# Patient Record
Sex: Female | Born: 1935
Health system: Southern US, Community
[De-identification: ages and names within clinical notes are randomized; demographics above are authoritative.]

## PROBLEM LIST (undated history)

## (undated) DIAGNOSIS — E790 Hyperuricemia without signs of inflammatory arthritis and tophaceous disease: Secondary | ICD-10-CM

## (undated) DIAGNOSIS — N189 Chronic kidney disease, unspecified: Secondary | ICD-10-CM

## (undated) DIAGNOSIS — M21611 Bunion of right foot: Secondary | ICD-10-CM

## (undated) DIAGNOSIS — R351 Nocturia: Secondary | ICD-10-CM

## (undated) DIAGNOSIS — M21612 Bunion of left foot: Secondary | ICD-10-CM

## (undated) DIAGNOSIS — N289 Disorder of kidney and ureter, unspecified: Secondary | ICD-10-CM

## (undated) DIAGNOSIS — K449 Diaphragmatic hernia without obstruction or gangrene: Secondary | ICD-10-CM

## (undated) DIAGNOSIS — R221 Localized swelling, mass and lump, neck: Secondary | ICD-10-CM

## (undated) DIAGNOSIS — M1990 Unspecified osteoarthritis, unspecified site: Secondary | ICD-10-CM

## (undated) DIAGNOSIS — I889 Nonspecific lymphadenitis, unspecified: Secondary | ICD-10-CM

## (undated) DIAGNOSIS — H3530 Unspecified macular degeneration: Secondary | ICD-10-CM

## (undated) DIAGNOSIS — I739 Peripheral vascular disease, unspecified: Secondary | ICD-10-CM

## (undated) DIAGNOSIS — I2510 Atherosclerotic heart disease of native coronary artery without angina pectoris: Secondary | ICD-10-CM

## (undated) DIAGNOSIS — K219 Gastro-esophageal reflux disease without esophagitis: Secondary | ICD-10-CM

## (undated) DIAGNOSIS — M1A3720 Chronic gout due to renal impairment, left ankle and foot, without tophus (tophi): Secondary | ICD-10-CM

## (undated) DIAGNOSIS — I10 Essential (primary) hypertension: Secondary | ICD-10-CM

## (undated) DIAGNOSIS — E785 Hyperlipidemia, unspecified: Secondary | ICD-10-CM

## (undated) DIAGNOSIS — J449 Chronic obstructive pulmonary disease, unspecified: Secondary | ICD-10-CM

## (undated) DIAGNOSIS — C801 Malignant (primary) neoplasm, unspecified: Secondary | ICD-10-CM

## (undated) DIAGNOSIS — Z95828 Presence of other vascular implants and grafts: Secondary | ICD-10-CM

## (undated) DIAGNOSIS — B999 Unspecified infectious disease: Secondary | ICD-10-CM

## (undated) DIAGNOSIS — I749 Embolism and thrombosis of unspecified artery: Secondary | ICD-10-CM

## (undated) HISTORY — DX: Chronic kidney disease, unspecified: N18.9

## (undated) HISTORY — PX: OTHER SURGICAL HISTORY: SHX169

## (undated) HISTORY — DX: Bunion of right foot: M21.611

## (undated) HISTORY — DX: Hyperuricemia without signs of inflammatory arthritis and tophaceous disease: E79.0

## (undated) HISTORY — DX: Malignant (primary) neoplasm, unspecified: C80.1

## (undated) HISTORY — DX: Bunion of left foot: M21.612

## (undated) HISTORY — DX: Diaphragmatic hernia without obstruction or gangrene: K44.9

## (undated) HISTORY — DX: Hyperlipidemia, unspecified: E78.5

## (undated) HISTORY — DX: Gastro-esophageal reflux disease without esophagitis: K21.9

## (undated) HISTORY — DX: Unspecified osteoarthritis, unspecified site: M19.90

## (undated) HISTORY — DX: Localized swelling, mass and lump, neck: R22.1

## (undated) HISTORY — DX: Unspecified infectious disease: B99.9

## (undated) HISTORY — DX: Peripheral vascular disease, unspecified: I73.9

## (undated) HISTORY — DX: Chronic obstructive pulmonary disease, unspecified: J44.9

## (undated) HISTORY — PX: CORONARY ANGIOPLASTY WITH STENT PLACEMENT: SHX49

## (undated) HISTORY — DX: Chronic gout due to renal impairment, left ankle and foot, without tophus (tophi): M1A.3720

## (undated) HISTORY — DX: Presence of other vascular implants and grafts: Z95.828

## (undated) HISTORY — DX: Nocturia: R35.1

## (undated) HISTORY — DX: Nonspecific lymphadenitis, unspecified: I88.9

## (undated) HISTORY — DX: Atherosclerotic heart disease of native coronary artery without angina pectoris: I25.10

## (undated) HISTORY — PX: CHOLECYSTECTOMY: SHX55

---

## 1979-12-01 HISTORY — PX: ABDOMINAL HYSTERECTOMY: SHX81

## 2002-01-28 HISTORY — PX: OTHER SURGICAL HISTORY: SHX169

## 2005-08-24 ENCOUNTER — Ambulatory Visit: Payer: Self-pay | Admitting: *Deleted

## 2005-09-23 ENCOUNTER — Inpatient Hospital Stay: Payer: Self-pay | Admitting: *Deleted

## 2006-03-22 ENCOUNTER — Ambulatory Visit: Payer: Self-pay | Admitting: *Deleted

## 2008-09-13 ENCOUNTER — Ambulatory Visit: Payer: Self-pay | Admitting: Vascular Surgery

## 2009-03-21 ENCOUNTER — Observation Stay: Payer: Self-pay | Admitting: *Deleted

## 2010-09-15 ENCOUNTER — Ambulatory Visit: Payer: Self-pay

## 2011-11-19 ENCOUNTER — Ambulatory Visit: Payer: Self-pay | Admitting: Family Medicine

## 2012-01-20 ENCOUNTER — Ambulatory Visit: Payer: Self-pay | Admitting: Internal Medicine

## 2012-01-22 ENCOUNTER — Ambulatory Visit: Payer: Self-pay | Admitting: Unknown Physician Specialty

## 2012-03-21 ENCOUNTER — Ambulatory Visit: Payer: Self-pay | Admitting: Gastroenterology

## 2012-05-11 ENCOUNTER — Ambulatory Visit: Payer: Self-pay | Admitting: Family Medicine

## 2012-11-01 ENCOUNTER — Ambulatory Visit: Payer: Self-pay | Admitting: Internal Medicine

## 2012-11-08 ENCOUNTER — Ambulatory Visit: Payer: Self-pay | Admitting: Internal Medicine

## 2013-01-21 ENCOUNTER — Emergency Department: Payer: Self-pay | Admitting: Emergency Medicine

## 2013-01-22 DIAGNOSIS — I70229 Atherosclerosis of native arteries of extremities with rest pain, unspecified extremity: Secondary | ICD-10-CM | POA: Insufficient documentation

## 2013-01-22 DIAGNOSIS — I2510 Atherosclerotic heart disease of native coronary artery without angina pectoris: Secondary | ICD-10-CM | POA: Insufficient documentation

## 2013-01-22 DIAGNOSIS — I70209 Unspecified atherosclerosis of native arteries of extremities, unspecified extremity: Secondary | ICD-10-CM | POA: Insufficient documentation

## 2013-01-22 DIAGNOSIS — I10 Essential (primary) hypertension: Secondary | ICD-10-CM | POA: Insufficient documentation

## 2013-01-22 DIAGNOSIS — N189 Chronic kidney disease, unspecified: Secondary | ICD-10-CM | POA: Insufficient documentation

## 2013-01-22 LAB — CBC
HCT: 34.7 % — ABNORMAL LOW (ref 35.0–47.0)
MCH: 30.6 pg (ref 26.0–34.0)
MCV: 95 fL (ref 80–100)
Platelet: 257 10*3/uL (ref 150–440)
RBC: 3.67 10*6/uL — ABNORMAL LOW (ref 3.80–5.20)
RDW: 13.3 % (ref 11.5–14.5)

## 2013-03-08 DIAGNOSIS — K219 Gastro-esophageal reflux disease without esophagitis: Secondary | ICD-10-CM | POA: Insufficient documentation

## 2013-03-31 ENCOUNTER — Encounter: Payer: Self-pay | Admitting: Internal Medicine

## 2013-04-04 LAB — CBC WITH DIFFERENTIAL/PLATELET
Eosinophil #: 0.4 10*3/uL (ref 0.0–0.7)
Eosinophil %: 4.5 %
HCT: 21.7 % — ABNORMAL LOW (ref 35.0–47.0)
HGB: 7.2 g/dL — ABNORMAL LOW (ref 12.0–16.0)
Lymphocyte #: 2.6 10*3/uL (ref 1.0–3.6)
Lymphocyte %: 30.6 %
MCH: 30.8 pg (ref 26.0–34.0)
MCHC: 33.2 g/dL (ref 32.0–36.0)
Monocyte #: 0.8 x10 3/mm (ref 0.2–0.9)
Neutrophil #: 4.7 10*3/uL (ref 1.4–6.5)
Neutrophil %: 54.5 %
Platelet: 204 10*3/uL (ref 150–440)

## 2013-04-04 LAB — PROTIME-INR: INR: 1.9

## 2013-04-05 ENCOUNTER — Observation Stay: Payer: Self-pay | Admitting: Internal Medicine

## 2013-04-05 LAB — HEMOGLOBIN: HGB: 8.2 g/dL — ABNORMAL LOW (ref 12.0–16.0)

## 2013-04-05 LAB — HEMATOCRIT: HCT: 24.4 % — ABNORMAL LOW (ref 35.0–47.0)

## 2013-04-07 LAB — CBC WITH DIFFERENTIAL/PLATELET
Basophil #: 0.1 10*3/uL (ref 0.0–0.1)
Basophil %: 0.6 %
HCT: 25 % — ABNORMAL LOW (ref 35.0–47.0)
HGB: 8.3 g/dL — ABNORMAL LOW (ref 12.0–16.0)
MCH: 30.8 pg (ref 26.0–34.0)
MCHC: 33.4 g/dL (ref 32.0–36.0)
Monocyte %: 11.6 %
Neutrophil #: 6.3 10*3/uL (ref 1.4–6.5)
Platelet: 229 10*3/uL (ref 150–440)
RBC: 2.71 10*6/uL — ABNORMAL LOW (ref 3.80–5.20)

## 2013-04-07 LAB — PROTIME-INR: Prothrombin Time: 20.6 secs — ABNORMAL HIGH (ref 11.5–14.7)

## 2013-04-13 LAB — CBC WITH DIFFERENTIAL/PLATELET
Basophil #: 0.1 10*3/uL (ref 0.0–0.1)
Eosinophil #: 0.3 10*3/uL (ref 0.0–0.7)
Eosinophil %: 5.2 %
HGB: 8.9 g/dL — ABNORMAL LOW (ref 12.0–16.0)
MCHC: 33.5 g/dL (ref 32.0–36.0)
MCV: 91 fL (ref 80–100)
Neutrophil #: 4 10*3/uL (ref 1.4–6.5)
Neutrophil %: 62.7 %
RBC: 2.89 10*6/uL — ABNORMAL LOW (ref 3.80–5.20)

## 2013-04-27 ENCOUNTER — Observation Stay: Payer: Self-pay | Admitting: Specialist

## 2013-04-27 LAB — COMPREHENSIVE METABOLIC PANEL
BUN: 24 mg/dL — ABNORMAL HIGH (ref 7–18)
Co2: 22 mmol/L (ref 21–32)
Creatinine: 1.7 mg/dL — ABNORMAL HIGH (ref 0.60–1.30)
EGFR (African American): 32 — ABNORMAL LOW
EGFR (Non-African Amer.): 28 — ABNORMAL LOW
Glucose: 113 mg/dL — ABNORMAL HIGH (ref 65–99)
Potassium: 4.4 mmol/L (ref 3.5–5.1)
SGOT(AST): 36 U/L (ref 15–37)
SGPT (ALT): 19 U/L (ref 12–78)
Sodium: 136 mmol/L (ref 136–145)

## 2013-04-27 LAB — PROTIME-INR: INR: 1.8

## 2013-04-27 LAB — CK TOTAL AND CKMB (NOT AT ARMC)
CK, Total: 49 U/L (ref 21–215)
CK-MB: 0.5 ng/mL (ref 0.5–3.6)

## 2013-04-27 LAB — PRO B NATRIURETIC PEPTIDE: B-Type Natriuretic Peptide: 14104 pg/mL — ABNORMAL HIGH (ref 0–450)

## 2013-04-27 LAB — CBC
HCT: 26.5 % — ABNORMAL LOW (ref 35.0–47.0)
HGB: 8.8 g/dL — ABNORMAL LOW (ref 12.0–16.0)
MCH: 29.7 pg (ref 26.0–34.0)
MCHC: 33.1 g/dL (ref 32.0–36.0)
Platelet: 149 10*3/uL — ABNORMAL LOW (ref 150–440)

## 2013-04-27 LAB — TROPONIN I: Troponin-I: <0.02 ng/mL

## 2013-04-28 LAB — BASIC METABOLIC PANEL
Anion Gap: 7 (ref 7–16)
BUN: 27 mg/dL — ABNORMAL HIGH (ref 7–18)
Chloride: 103 mmol/L (ref 98–107)
Co2: 25 mmol/L (ref 21–32)
EGFR (Non-African Amer.): 23 — ABNORMAL LOW
Glucose: 112 mg/dL — ABNORMAL HIGH (ref 65–99)
Osmolality: 276 (ref 275–301)
Potassium: 3.7 mmol/L (ref 3.5–5.1)
Sodium: 135 mmol/L — ABNORMAL LOW (ref 136–145)

## 2013-04-28 LAB — TROPONIN I: Troponin-I: 0.02 ng/mL

## 2013-04-28 LAB — PROTIME-INR: Prothrombin Time: 22.6 secs — ABNORMAL HIGH (ref 11.5–14.7)

## 2013-04-30 DIAGNOSIS — B999 Unspecified infectious disease: Secondary | ICD-10-CM

## 2013-04-30 HISTORY — DX: Unspecified infectious disease: B99.9

## 2013-05-16 ENCOUNTER — Ambulatory Visit: Payer: Self-pay | Admitting: Internal Medicine

## 2013-10-27 ENCOUNTER — Observation Stay: Payer: Self-pay | Admitting: Internal Medicine

## 2013-10-27 LAB — TROPONIN I
Troponin-I: 0.71 ng/mL — ABNORMAL HIGH
Troponin-I: 0.84 ng/mL — ABNORMAL HIGH
Troponin-I: 0.7 ng/mL — ABNORMAL HIGH

## 2013-10-27 LAB — URINALYSIS, COMPLETE
Bilirubin,UR: NEGATIVE
Glucose,UR: NEGATIVE mg/dL (ref 0–75)
Ketone: NEGATIVE
Leukocyte Esterase: NEGATIVE
Ph: 5 (ref 4.5–8.0)
Protein: NEGATIVE
RBC,UR: <1 /HPF (ref 0–5)
WBC UR: 2 /HPF (ref 0–5)

## 2013-10-27 LAB — CK-MB
CK-MB: 5.3 ng/mL — ABNORMAL HIGH (ref 0.5–3.6)
CK-MB: 4 ng/mL — ABNORMAL HIGH (ref 0.5–3.6)

## 2013-10-27 LAB — CBC WITH DIFFERENTIAL/PLATELET
Eosinophil %: 3.8 %
HCT: 31 % — ABNORMAL LOW (ref 35.0–47.0)
HGB: 10.2 g/dL — ABNORMAL LOW (ref 12.0–16.0)
Lymphocyte #: 1.4 10*3/uL (ref 1.0–3.6)
Lymphocyte %: 21.3 %
MCHC: 32.9 g/dL (ref 32.0–36.0)
Monocyte #: 0.9 x10 3/mm (ref 0.2–0.9)
Monocyte %: 14.6 %
Neutrophil #: 3.8 10*3/uL (ref 1.4–6.5)
Neutrophil %: 59.4 %
Platelet: 204 10*3/uL (ref 150–440)
RBC: 3.42 10*6/uL — ABNORMAL LOW (ref 3.80–5.20)
RDW: 13.8 % (ref 11.5–14.5)
WBC: 6.4 10*3/uL (ref 3.6–11.0)

## 2013-10-27 LAB — COMPREHENSIVE METABOLIC PANEL
BUN: 38 mg/dL — ABNORMAL HIGH (ref 7–18)
Bilirubin,Total: 0.4 mg/dL (ref 0.2–1.0)
Chloride: 106 mmol/L (ref 98–107)
Co2: 22 mmol/L (ref 21–32)
EGFR (African American): 26 — ABNORMAL LOW
Glucose: 106 mg/dL — ABNORMAL HIGH (ref 65–99)
SGPT (ALT): 79 U/L — ABNORMAL HIGH (ref 12–78)
Sodium: 135 mmol/L — ABNORMAL LOW (ref 136–145)

## 2013-10-27 LAB — PROTIME-INR: Prothrombin Time: 23.6 secs — ABNORMAL HIGH (ref 11.5–14.7)

## 2013-10-27 LAB — LIPASE, BLOOD: Lipase: 134 U/L (ref 73–393)

## 2013-10-28 LAB — PROTIME-INR
INR: 1.9
Prothrombin Time: 21.3 secs — ABNORMAL HIGH (ref 11.5–14.7)

## 2013-10-28 LAB — COMPREHENSIVE METABOLIC PANEL
Albumin: 3 g/dL — ABNORMAL LOW (ref 3.4–5.0)
Alkaline Phosphatase: 132 U/L — ABNORMAL HIGH
Chloride: 108 mmol/L — ABNORMAL HIGH (ref 98–107)
Co2: 24 mmol/L (ref 21–32)
Creatinine: 1.9 mg/dL — ABNORMAL HIGH (ref 0.60–1.30)
EGFR (African American): 28 — ABNORMAL LOW
EGFR (Non-African Amer.): 24 — ABNORMAL LOW
Glucose: 165 mg/dL — ABNORMAL HIGH (ref 65–99)
Osmolality: 287 (ref 275–301)
Potassium: 4.4 mmol/L (ref 3.5–5.1)
SGOT(AST): 55 U/L — ABNORMAL HIGH (ref 15–37)
SGPT (ALT): 60 U/L (ref 12–78)

## 2013-10-28 LAB — MAGNESIUM: Magnesium: 1.9 mg/dL

## 2013-10-28 LAB — CBC WITH DIFFERENTIAL/PLATELET
Basophil #: 0 10*3/uL (ref 0.0–0.1)
Basophil %: 0.4 %
Eosinophil #: 0 10*3/uL (ref 0.0–0.7)
Eosinophil %: 0.1 %
HCT: 28.9 % — ABNORMAL LOW (ref 35.0–47.0)
HGB: 9.5 g/dL — ABNORMAL LOW (ref 12.0–16.0)
Lymphocyte #: 0.8 10*3/uL — ABNORMAL LOW (ref 1.0–3.6)
Lymphocyte %: 29.6 %
MCH: 29.8 pg (ref 26.0–34.0)
MCV: 91 fL (ref 80–100)
Monocyte %: 1.7 %
Platelet: 190 10*3/uL (ref 150–440)
RDW: 13.8 % (ref 11.5–14.5)

## 2013-10-28 LAB — LIPID PANEL
Cholesterol: 149 mg/dL (ref 0–200)
HDL Cholesterol: 44 mg/dL (ref 40–60)
Ldl Cholesterol, Calc: 95 mg/dL (ref 0–100)
Triglycerides: 52 mg/dL (ref 0–200)
VLDL Cholesterol, Calc: 10 mg/dL (ref 5–40)

## 2013-10-29 LAB — CBC WITH DIFFERENTIAL/PLATELET
Basophil #: 0 10*3/uL (ref 0.0–0.1)
Basophil %: 0.5 %
Eosinophil #: 0 10*3/uL (ref 0.0–0.7)
Eosinophil %: 0 %
HCT: 29.3 % — ABNORMAL LOW (ref 35.0–47.0)
Lymphocyte #: 1.4 10*3/uL (ref 1.0–3.6)
Lymphocyte %: 17.4 %
MCH: 29.6 pg (ref 26.0–34.0)
MCHC: 32.7 g/dL (ref 32.0–36.0)
MCV: 91 fL (ref 80–100)
Neutrophil %: 74.2 %
RBC: 3.24 10*6/uL — ABNORMAL LOW (ref 3.80–5.20)
WBC: 8.1 10*3/uL (ref 3.6–11.0)

## 2013-10-29 LAB — COMPREHENSIVE METABOLIC PANEL
Albumin: 3.2 g/dL — ABNORMAL LOW (ref 3.4–5.0)
Alkaline Phosphatase: 113 U/L
Anion Gap: 8 (ref 7–16)
BUN: 39 mg/dL — ABNORMAL HIGH (ref 7–18)
Bilirubin,Total: 0.5 mg/dL (ref 0.2–1.0)
Calcium, Total: 9.1 mg/dL (ref 8.5–10.1)
Co2: 23 mmol/L (ref 21–32)
EGFR (African American): 32 — ABNORMAL LOW
Glucose: 116 mg/dL — ABNORMAL HIGH (ref 65–99)
SGOT(AST): 44 U/L — ABNORMAL HIGH (ref 15–37)
Sodium: 139 mmol/L (ref 136–145)
Total Protein: 6.2 g/dL — ABNORMAL LOW (ref 6.4–8.2)

## 2013-10-29 LAB — PROTIME-INR: Prothrombin Time: 22.4 secs — ABNORMAL HIGH (ref 11.5–14.7)

## 2013-11-15 ENCOUNTER — Ambulatory Visit: Payer: Self-pay | Admitting: Internal Medicine

## 2014-02-16 ENCOUNTER — Emergency Department: Payer: Self-pay | Admitting: Emergency Medicine

## 2014-02-16 LAB — CBC WITH DIFFERENTIAL/PLATELET
Basophil #: 0.1 10*3/uL (ref 0.0–0.1)
Basophil %: 0.8 %
Eosinophil #: 0 10*3/uL (ref 0.0–0.7)
Eosinophil %: 0.1 %
HCT: 34.5 % — ABNORMAL LOW (ref 35.0–47.0)
HGB: 11.3 g/dL — ABNORMAL LOW (ref 12.0–16.0)
Lymphocyte #: 1.4 10*3/uL (ref 1.0–3.6)
Lymphocyte %: 11.9 %
MCH: 29.4 pg (ref 26.0–34.0)
MCHC: 32.7 g/dL (ref 32.0–36.0)
MCV: 90 fL (ref 80–100)
MONO ABS: 1.2 x10 3/mm — AB (ref 0.2–0.9)
MONOS PCT: 10.3 %
NEUTROS ABS: 9.3 10*3/uL — AB (ref 1.4–6.5)
Neutrophil %: 76.9 %
Platelet: 190 10*3/uL (ref 150–440)
RBC: 3.84 10*6/uL (ref 3.80–5.20)
RDW: 14.5 % (ref 11.5–14.5)
WBC: 12.1 10*3/uL — AB (ref 3.6–11.0)

## 2014-02-16 LAB — BASIC METABOLIC PANEL
ANION GAP: 6 — AB (ref 7–16)
BUN: 29 mg/dL — ABNORMAL HIGH (ref 7–18)
CREATININE: 1.84 mg/dL — AB (ref 0.60–1.30)
Calcium, Total: 9.1 mg/dL (ref 8.5–10.1)
Chloride: 102 mmol/L (ref 98–107)
Co2: 24 mmol/L (ref 21–32)
EGFR (African American): 29 — ABNORMAL LOW
GFR CALC NON AF AMER: 25 — AB
GLUCOSE: 98 mg/dL (ref 65–99)
Osmolality: 270 (ref 275–301)
POTASSIUM: 4.7 mmol/L (ref 3.5–5.1)
Sodium: 132 mmol/L — ABNORMAL LOW (ref 136–145)

## 2014-02-16 LAB — PROTIME-INR
INR: 3.5
Prothrombin Time: 33.9 secs — ABNORMAL HIGH (ref 11.5–14.7)

## 2014-06-27 ENCOUNTER — Ambulatory Visit: Payer: Self-pay | Admitting: Physician Assistant

## 2014-10-01 LAB — BASIC METABOLIC PANEL
BUN: 23 mg/dL — AB (ref 4–21)
Creatinine: 2 mg/dL — AB (ref <=1.1)
Glucose: 105 mg/dL
Sodium: 142 mmol/L (ref 137–147)

## 2014-10-01 LAB — CBC AND DIFFERENTIAL: WBC: 5 10*3/mL

## 2014-11-27 IMAGING — US US EXTREM LOW VENOUS*R*
1 series · 14 of 22 positions shown · non-contrast
Comparison: none

REASON FOR EXAM: COMMENTS:

[Series 1: us extrem low venous*right* · 0.10mm/px · 14 of 22 slices shown]
[im 1/22]
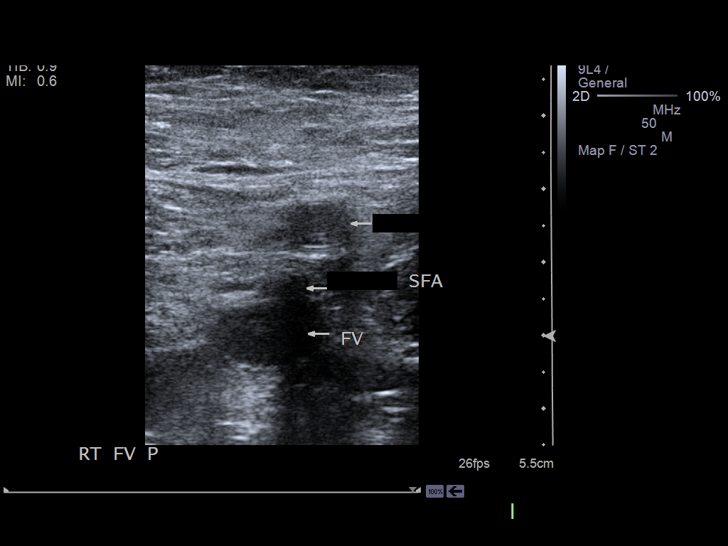
[im 3/22]
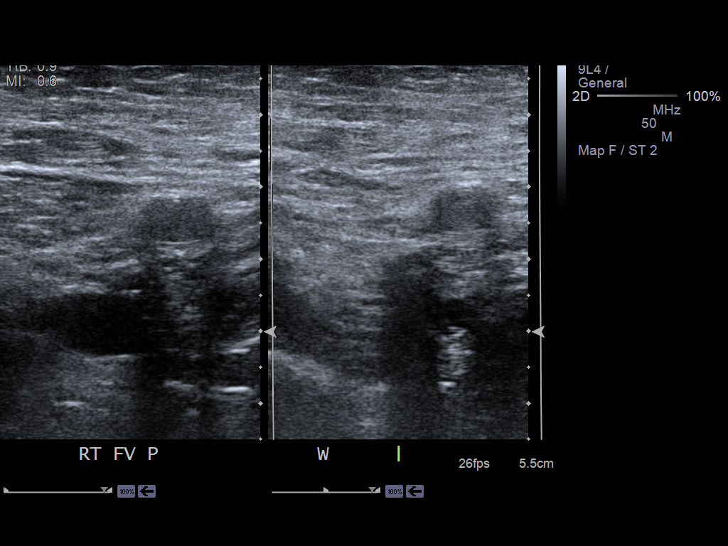
[im 4/22]
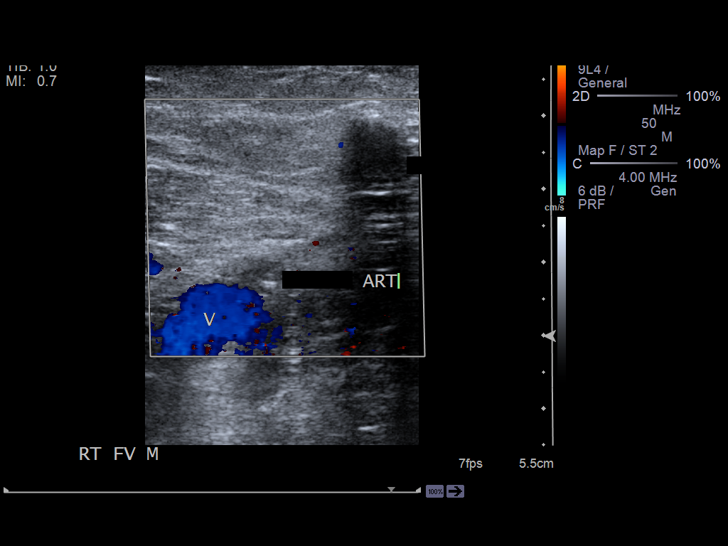
[im 6/22]
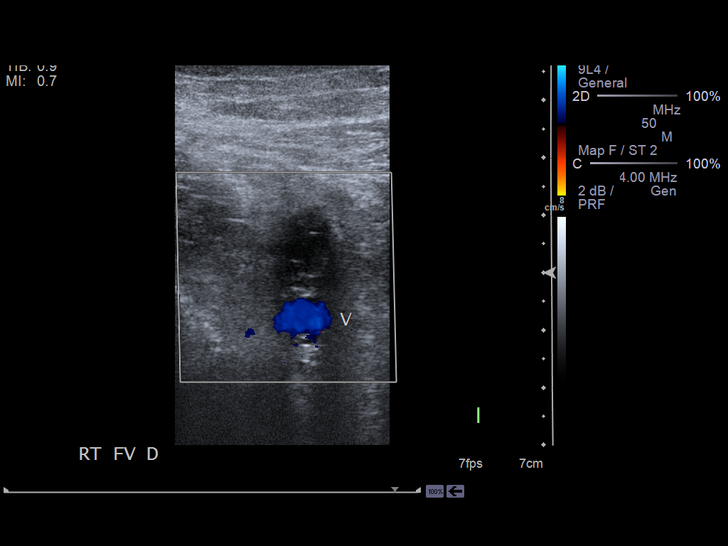
[im 8/22]
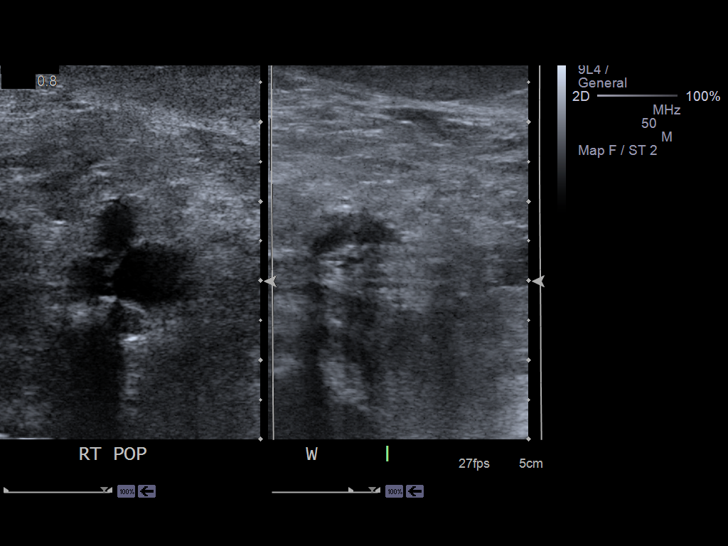
[im 9/22]
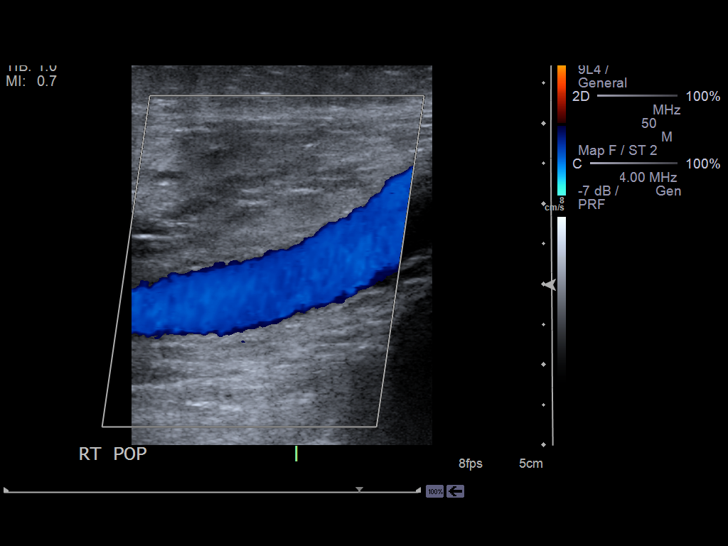
[im 11/22]
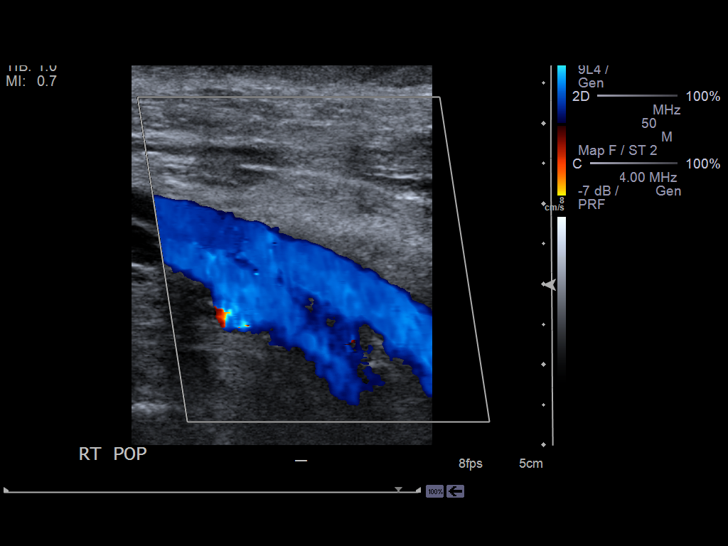
[im 12/22]
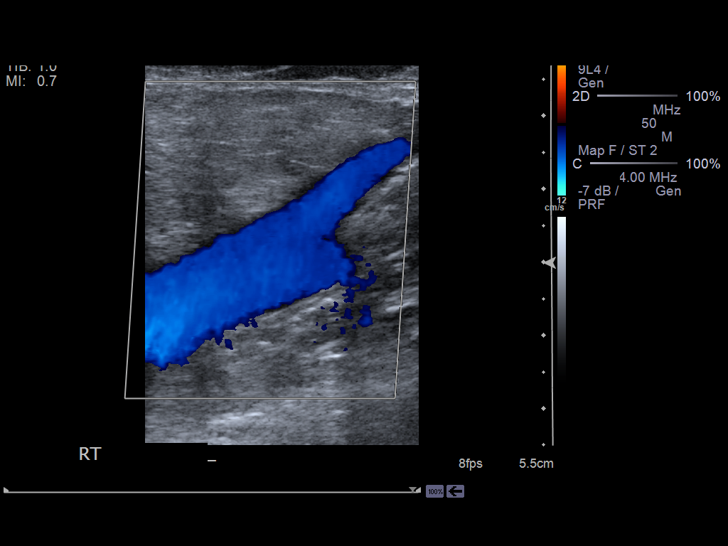
[im 14/22]
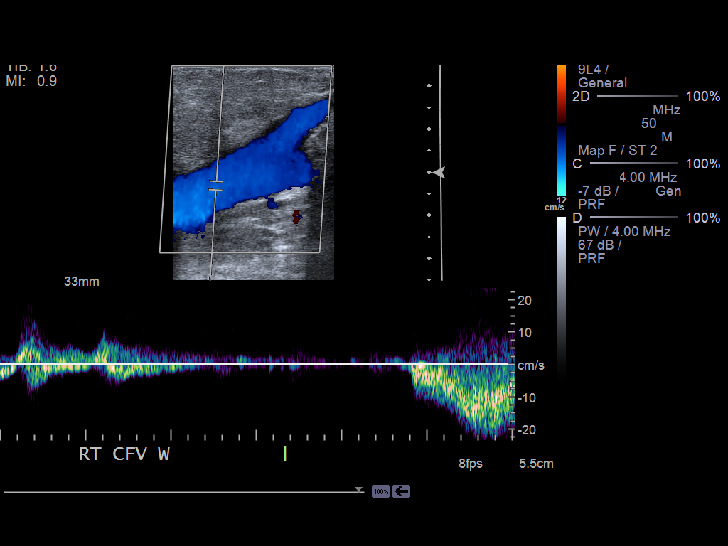
[im 15/22]
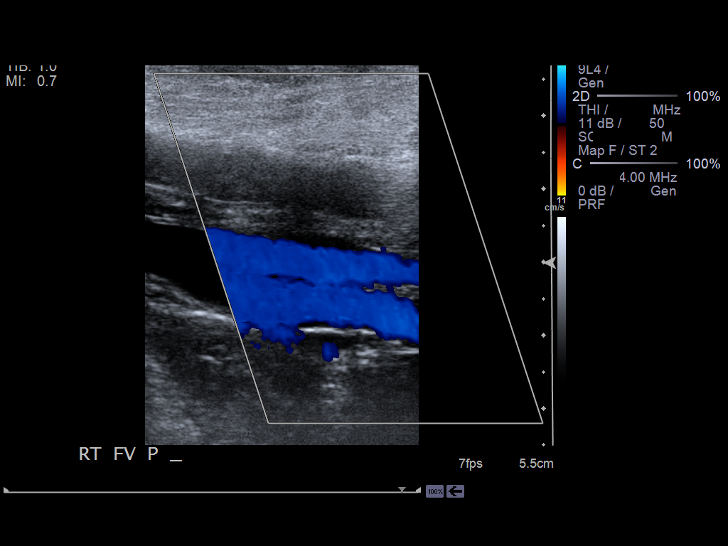
[im 17/22]
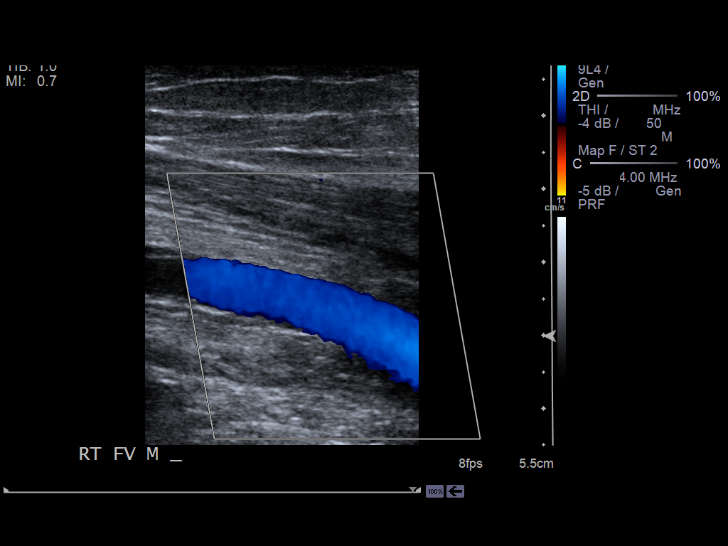
[im 19/22]
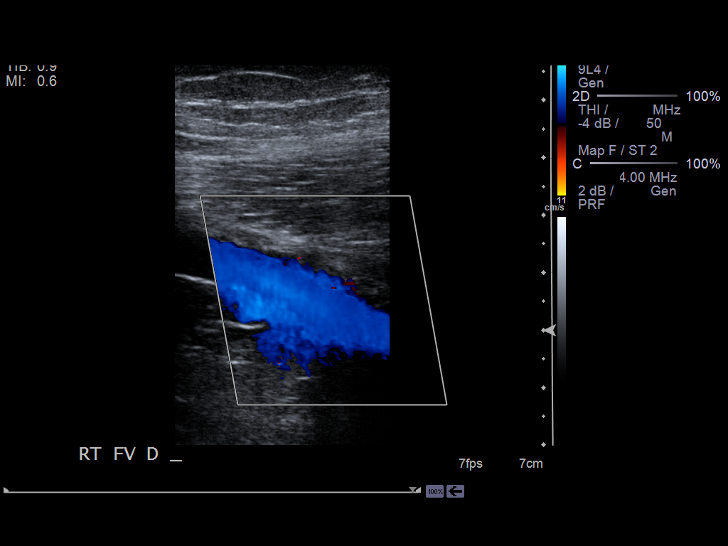
[im 20/22]
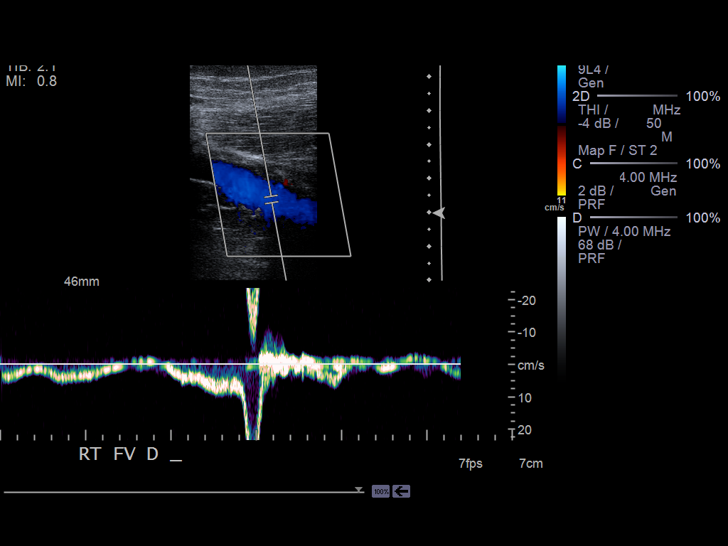
[im 22/22]
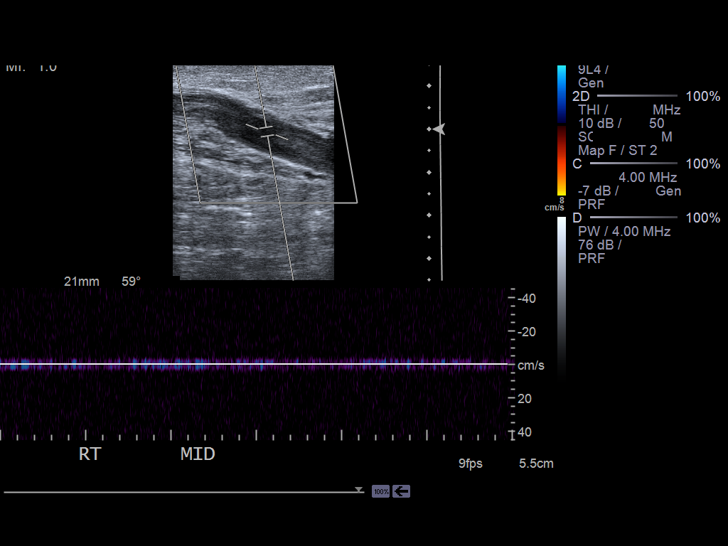

[14 of 22 positions shown; findings below may reference images not displayed]

PROCEDURE:     US  - US DOPPLER LOW EXTR RIGHT  - January 21, 2013  [DATE]

RESULT:     Grayscale and color flow Doppler techniques were employed to
evaluate the deep veins of the right lower extremity.

The right femoral and popliteal veins are normally compressible. The
waveform patterns are normal and the color flow images are normal. The
response to the augmentation and Valsalva maneuvers is normal.

A structure was demonstrated consistent with a bypass graft from the common
femoral artery to the distal superficial femoral artery. No normal flow was
demonstrated within it.
IMPRESSION: 1. There is no evidence of thrombus within the right femoral or popliteal
veins.
2. There is no normal flow demonstrated within an apparent common femoral to
superficial femoral artery graft.

[REDACTED]

## 2014-12-06 ENCOUNTER — Emergency Department: Payer: Self-pay | Admitting: Emergency Medicine

## 2014-12-06 DIAGNOSIS — R040 Epistaxis: Secondary | ICD-10-CM | POA: Diagnosis not present

## 2014-12-06 LAB — CBC WITH DIFFERENTIAL/PLATELET
BASOS PCT: 0.6 %
Basophil #: 0 10*3/uL (ref 0.0–0.1)
Eosinophil #: 0.3 10*3/uL (ref 0.0–0.7)
Eosinophil %: 4 %
HCT: 36.7 % (ref 35.0–47.0)
HGB: 11.6 g/dL — AB (ref 12.0–16.0)
LYMPHS ABS: 1.6 10*3/uL (ref 1.0–3.6)
Lymphocyte %: 24.8 %
MCH: 29.3 pg (ref 26.0–34.0)
MCHC: 31.7 g/dL — AB (ref 32.0–36.0)
MCV: 92 fL (ref 80–100)
MONO ABS: 0.7 x10 3/mm (ref 0.2–0.9)
Monocyte %: 11.2 %
Neutrophil #: 3.9 10*3/uL (ref 1.4–6.5)
Neutrophil %: 59.4 %
Platelet: 219 10*3/uL (ref 150–440)
RBC: 3.98 10*6/uL (ref 3.80–5.20)
RDW: 14.5 % (ref 11.5–14.5)
WBC: 6.6 10*3/uL (ref 3.6–11.0)

## 2014-12-06 LAB — COMPREHENSIVE METABOLIC PANEL
ALT: 24 U/L
Albumin: 3.9 g/dL (ref 3.4–5.0)
Alkaline Phosphatase: 97 U/L
Anion Gap: 7 (ref 7–16)
BUN: 33 mg/dL — ABNORMAL HIGH (ref 7–18)
Bilirubin,Total: 0.5 mg/dL (ref 0.2–1.0)
CALCIUM: 8.9 mg/dL (ref 8.5–10.1)
CO2: 25 mmol/L (ref 21–32)
CREATININE: 1.98 mg/dL — AB (ref 0.60–1.30)
Chloride: 107 mmol/L (ref 98–107)
GFR CALC AF AMER: 31 — AB
GFR CALC NON AF AMER: 26 — AB
Glucose: 106 mg/dL — ABNORMAL HIGH (ref 65–99)
Osmolality: 285 (ref 275–301)
Potassium: 4.4 mmol/L (ref 3.5–5.1)
SGOT(AST): 29 U/L (ref 15–37)
Sodium: 139 mmol/L (ref 136–145)
Total Protein: 7.9 g/dL (ref 6.4–8.2)

## 2014-12-06 LAB — APTT: Activated PTT: 39.4 secs — ABNORMAL HIGH (ref 23.6–35.9)

## 2014-12-06 LAB — PROTIME-INR
INR: 2.8
Prothrombin Time: 28.8 secs — ABNORMAL HIGH (ref 11.5–14.7)

## 2015-01-10 DIAGNOSIS — H3531 Nonexudative age-related macular degeneration: Secondary | ICD-10-CM | POA: Diagnosis not present

## 2015-01-24 ENCOUNTER — Encounter (INDEPENDENT_AMBULATORY_CARE_PROVIDER_SITE_OTHER): Payer: Medicare PPO | Admitting: Ophthalmology

## 2015-01-24 DIAGNOSIS — I10 Essential (primary) hypertension: Secondary | ICD-10-CM

## 2015-01-24 DIAGNOSIS — H35033 Hypertensive retinopathy, bilateral: Secondary | ICD-10-CM | POA: Diagnosis not present

## 2015-01-24 DIAGNOSIS — H43813 Vitreous degeneration, bilateral: Secondary | ICD-10-CM | POA: Diagnosis not present

## 2015-01-24 DIAGNOSIS — H3531 Nonexudative age-related macular degeneration: Secondary | ICD-10-CM | POA: Diagnosis not present

## 2015-03-22 NOTE — Consult Note (Signed)
PATIENT NAME:  Lori Johnson, BRAKEFIELD MR#:  [id number] DATE OF BIRTH:  [birth year]  DATE OF CONSULTATION:  10/28/2013  REFERRING PHYSICIAN:  Dr. Laurin Coder CONSULTING PHYSICIAN:  Corey Skains, MD  PRIMARY CARE PHYSICIAN:  [facility], [facility], [facility].   CHIEF COMPLAINT:  Chest pain.   HISTORY OF PRESENT ILLNESS:  This is an 81 year old female with known hypertension and hyperlipidemia on appropriate medication management and stable.  She is mildly short of breath with physical activity, unchanged from in the past when she has had new onset of waxing and waning right-sided chest discomfort.  This right-sided chest discomfort has been happening for the last 4 to 5 days, but culminating in significant severe discomfort with any movement whatsoever, atypical in nature.  The patient was given medication management for this including pain pills and prednisone for which has significantly improved her symptoms.  The patient has had no current evidence of EKG changes or acute myocardial infarction by EKG.  EKG shows normal sinus rhythm, normal EKG.  The patient has had no further evidence of significant symptoms when moving around in her room.  The patient was given a lidocaine patch which appears to be helping somewhat.  She has this vague history of coronary artery disease and severe peripheral vascular disease, although when discussed with the patient she does not have any current historic interventions.  She has had deep venous thrombosis in the past and has had some lower extremity edema of which she has been on anticoagulation.  She now has had an elevated troponin of 0.85 concerning for cardiovascular disease.  In addition to that, she had a telemetry change consistent with wide complex tachycardia, possibly atrial fibrillation with rapid ventricular rate, although cannot rule out ventricular tachycardia.  The patient is comfortable at this time.  The remainder of review of systems negative for vision change, ringing  in the ears, hearing loss, cough, congestion, heartburn, nausea, vomiting, diarrhea, bloody stools, stomach pain, extremity pain, leg weakness, cramping of the buttocks, known blood clots, headaches, blackouts, dizzy spells, nosebleeds, congestion, trouble swallowing, frequent urination, urination at night, muscle weakness, numbness, anxiety, depression, skin lesions, or skin rashes.   PAST MEDICAL HISTORY: 1.  Hypertension.  2.  Deep venous thrombosis.  3.  Hyperlipidemia  4.  History of COPD.   FAMILY HISTORY:  Father had early onset of cardiovascular disease.   SOCIAL HISTORY:  She has remote tobacco use.   ALLERGIES:  AS LISTED.   MEDICATIONS:  As listed.   PHYSICAL EXAMINATION: VITAL SIGNS:  Her blood pressure is 166/64 bilaterally, heart rate 66 upright, reclining, and regular.  GENERAL:  She is a well-appearing female in no acute distress.  HEAD, EYES, EARS, NOSE AND THROAT:  No icterus, thyromegaly, ulcers, hemorrhage, or xanthelasma.  CARDIOVASCULAR:  Regular rate and rhythm.  Normal S1 and S2 without murmur, gallop or rub.  PMI is diffuse.  Carotid upstroke normal without bruit.  Jugular venous pressure is normal.  LUNGS:  A few basilar crackles with normal respirations.  ABDOMEN:  Soft, nontender, without hepatosplenomegaly or masses.  Abdominal aorta is normal size without bruit.  EXTREMITIES:  2+ radial, femoral, trace dorsal pedal pulses with no lower extremity edema, cyanosis, clubbing or ulcers.  NEUROLOGIC:  The patient is oriented to time, place and person with normal mood and affect.   ASSESSMENT:  An 81 year old female with atypical right-sided chest discomfort with improvement with nonsteroidal medication management and steroidal medication management and lidocaine patch with a potential incidental elevated  troponin with chronic kidney disease, anemia, possibly consistent with demand ischemia with wide complex tachycardia and a history of coronary artery disease,  although not substantiated by the patient.   RECOMMENDATIONS: 1.  Continue serial ECG and enzymes to assess for possible myocardial infarction or demand ischemia.  2.  Echocardiogram for left ventricular systolic dysfunction, valvular heart disease contributing to above.  3.  Lidocaine patch and prednisone for treatment of possible atypical musculoskeletal chest pain. 4.  Further consideration of treadmill or Lexiscan infusion stress test versus a cardiac catheterization depending on the chest pain or other symptoms with ambulation throughout the day.  5.  Further investigation of right upper quadrant pain and ultrasound for the possibility of liver and/or gall duct, bile duct abnormalities.  6.  Further treatment options after above.      ____________________________ Corey Skains, MD bjk:ea D: 10/28/2013 06:30:19 ET T: 10/28/2013 07:04:45 ET JOB#: [id number]  cc: Corey Skains, MD, <Dictator> Corey Skains MD ELECTRONICALLY SIGNED 11/07/2013 12:56

## 2015-03-22 NOTE — H&P (Signed)
PATIENT NAME:  Lori Johnson, Lori Johnson MR#:  [id number] DATE OF BIRTH:  Apr 14, 1930  DATE OF ADMISSION:  10/27/2013  REASON FOR ADMISSION:  Chest pain, abdominal pain, evaluation in the troponins, elevation in the LFTs.   PRIMARY CARE PHYSICIAN:  Dr. Ilene Qua.  REFERRING PHYSICIAN:  Dr. Thomasene Lot.   HISTORY OF PRESENT ILLNESS:  This is a very nice 81 year old female who has a history of severe peripheral vascular disease, coronary artery disease, hypertension, on chronic anticoagulation due to previous blood clots after vascular procedures, maybe COPD, undiagnosed and GERD. The patient comes today with a history of having significant pain that is going on for 2 weeks, located in the right upper quadrant or right chest. The patient states that she went to the Urgent Care a week and a half ago and she was given some pain medications, but they really did not take care of the pain, for which the patient went home, and the pain is just not getting better. The pain is described as located on the right upper quadrant or maybe right lower chest, sharp in nature. It is worse when she moves or when she changed positions. Whenever she is lying down and still or not moving, the pain is 0/10. The pain only comes with touch or with movement. There is no radiation of the pain. The patient can pinpoint right away where the pain comes from with a single finger and she touches it and it hurts. There is no constipation or at least the patient has not had any significant changes in her bowel movements. She goes every 2 to 3 days, and that is her normal. She has occasional cough, which is secondary to reflux, but she has not had any history of pneumonia or upper respiratory infections lately. The patient states the pain is 10/10 whenever she moves or presses against the area. She is just really concerned of it. She does not have any significant GI symptoms. No jaundice. She had a cholecystectomy before. At this moment, the patient is being  admitted because she has a positive troponin. She does have chronic kidney disease, but in the past, her troponins have been negative back in May with the same creatinine as she has right now. The patient has an elevation of LFTs.   REVIEW OF SYSTEMS: CONSTITUTIONAL:  No significant fever, fatigue, weakness, weight loss or weight gain.  EYES:  No blurry vision, double vision.  EARS, NOSE, THROAT:  No tinnitus, difficulty swallowing or postnasal drip. RESPIRATIONS:  No wheezing, no hemoptysis. Positive cough, which is likely secondary to reflux, worse in the morning. She has been told this is due to reflux anyway. She might have COPD as she was a smoker, but her pulmonologist has told her that her lungs are in good shape right now.  CARDIOVASCULAR:  No chest pain prior to this in the left side, only in the right side now. No orthopnea. No syncope. Positive chronic edema. . No palpitations.  GASTROINTESTINAL:  No nausea, vomiting, constipation, diarrhea, hemoptysis or hematuria.  GENITOURINARY:  No dysuria or hematuria.  GYNECOLOGIC:   No breast masses.  ENDOCRINOLOGY: No polyuria, polydipsia, polyphagia, cold or heat intolerance. No thyroid problems.  SKIN:  No rashes, petechiae or new lesions. She does have a known history of some skin cancers, likely a squamous cell carcinomas as well as basal cell carcinomas, and she has one active lesion on her lower extremity that is going to be removed by her dermatologist.  HEMATOLOGIC AND LYMPHATIC:  No easy bruising although the patient is on chronic anticoagulation. No significant bleeding.  MUSCULOSKELETAL:  No significant neck pain, back pain or gout.  NEUROLOGIC:  No numbness, tingling, CVAs or TIAs.  PSYCHIATRIC:  No significant insomnia or depression.   PAST MEDICAL HISTORY:  1.  Coronary artery disease.  2.  Peripheral vascular disease.  3.  Hypertension.  4.  Chronic anticoagulation due to previous extensive DVTs of the lower extremity after  procedures. 5.  GERD.  6.  COPD versus asthma.    PAST SURGICAL HISTORY:  1.  Stents in her heart. Stents in her lower extremities. Positive bypass of the femoral artery.  2.  Cholecystectomy.  3.  Hysterectomy.  4.  Appendectomy.  5.  Skin cancer removals.   ALLERGIES: 1.  CODEINE, GI UPSET.  2.  PENICILLIN AND SULFA DRUGS, HIVES.   SOCIAL HISTORY:  The patient used to smoke. She quit 30 years ago. She smoked for over 25 years, 1 pack a day. She does not drink. She lives with her daughter.   FAMILY HISTORY:  Positive for coronary artery disease in her father and sister. No history of cancer.   CURRENT MEDICATIONS:   1.  Warfarin 5 mg daily.  2.  Tylenol 500 mg as needed for pain. 3.  ProAir as needed for shortness of breath.  4.  Pravastatin 20 mg once a day.  5.  Oxycodone 5 mg 1 to 2 every 4 hours but the patient says that she was not taking that. 6.  NitroTab as needed for chest pain. 7.  Losartan with hydrochlorothiazide 12.5/50 once daily.  8.  Furosemide 20 mg daily.  9.  Ferrous sulfate 325 mg daily.  10.  Docusate once a day.  11.  Claritin 10 mg daily.  12.  Aspirin 81 mg daily.  13.  Amlodipine 10 mg daily.   PHYSICAL EXAMINATION: VITAL SIGNS:  Blood pressure is 129/52, pulse 65, respirations 18, temperature 98.4, oxygen saturation 98% on room air.  GENERAL:  The patient is alert, oriented x 3, in no acute distress. No respiratory distress unless she is poked in her belly or moving. At that moment, she has significant pain and guarding.  HEENT:  Pupils are equal and reactive. Extraocular movements are intact. Mucosa are moist. Anicteric sclerae. Pink conjunctivae. No oral lesions. No oropharyngeal exudates.  NECK:  Supple. No JVD. No thyromegaly. No adenopathy. No carotid bruits.  CARDIOVASCULAR:  Regular rate and rhythm. No murmurs, rubs or gallops are appreciated. No tenderness to palpation of anterior chest wall on the left side. No displacement of PMI.  LUNGS:   Clear without any wheezing or crepitus. No use of accessory muscles.  ABDOMEN:  There is severe or significant exquisite tenderness to palpation of a single point at the level of the right rib cage. The pain is absolutely reproducible. The patient has guarding and she is not letting me touch her any harder. The pain is elicited with minimal pressure on top of the rib. There is not tenderness in other areas at the level of the abdomen. There is no rebound tenderness on the abdomen and no guarding of the abdomen, just on that specific single spot, which is located at the level of the middle chest in line with the nipple at the level of the last rib on the rib cage. Abdominal sounds or intestinal sounds are normal. No hepatosplenomegaly. No masses.  GENITAL:  Deferred.  EXTREMITIES:  There is some edema, +1, on both  lower extremities, which is chronic. No cyanosis or clubbing. Pulses are palpable +1, +2, bilateral. Capillary refill is around 3 seconds. SKIN:  There is significant changes related to chronic venous insufficiency and also peripheral vascular disease on the lower extremities. No significant erythema or rashes. There is a small atypical keratosis of the left lower extremity, which she states has be diagnosed as a cancer. It is going to be removed by her dermatologist.  NEUROLOGIC:  Cranial nerves II through XII intact. Strength is 5/5 in all 4 extremities.  PSYCHIATRIC:  No significant anxiety or signs of depression. Affect seems to be normal.  LYMPHATIC:  Negative for lymphadenopathy in the neck, supraclavicular areas.  MUSCULOSKELETAL:  No significant joint effusions or joint deformity.   LABORATORY, DIAGNOSTIC, AND RADIOLOGICAL DATA:  Glucose is 106, BUN 38, creatinine 2.03, sodium 135, potassium 4.5. Her alkaline phosphatase is slightly elevated at 144. AST is elevated at 80. ALT is elevated at 79. Troponin is 0.7, CK is 5.5. White count is 6.4 and a hemoglobin of 10.2, which is also chronic  anemia as well. Her INR is pending, but her PTT is 39.4.   URINALYSIS:  Does not show any significant signs of infection.   EKG:  No ST depression or elevation noticed, normal sinus rhythm. Overall normal.   Chest x-ray:  There is significant elevation of the right hemidiaphragm, which is new compared with previous exams. Possible atelectasis, likely due to the pain. The patient not breathing deeply. There is no significant infiltrates or effusions. No free air in the abdomen and the mediastinum seems to be appropriately wide.   ASSESSMENT AND PLAN:  An 81 year old female with a history of coronary artery disease, peripheral vascular disease, hypertension, anticoagulation due to extensive DVTs after surgical procedures. Comes today with chest/abdominal pain.  1.  Chest pain. The pain is mostly located at the level of the rib cage on the right upper quadrant or right lower lobe. Positive throponin. The patient has exquisite tenderness whenever it is really touched lightly. This has been going on over two weeks for which the possibility of herpes is low since it does have any significant rashes in that time although it is still possibility on the diagnosis. The patient has atypical chest pain fully reproductable. The problem is that we have a positive troponin. The troponin positive. It could be secondary to chronic kidney disease, although comparing with previous labs, she has had positive troponins with the same levels of creatinine. We are going to have to monitor and cycle troponins. If her troponin decreases, or is the same, we can call this a troponin leak. If not we can call Cardiology. At this moment, we are going to treat her with aspirin, monitor closely, keep her on telemetry, add lipid profile. We are not going to have lipid medications because of her recent elevation of LFTs. But we can provide morphine and nitroglycerin if necessary for pain. Her blood pressure is stable and her pulse is in the  60s for which I am not going to add a beta blocker at this moment on unless there is significant changes on EKG or further elevation of troponin.  2.  Elevation of LFTs. This could be related to this pain on the right upper quadrant. We are going to get an ultrasound to include the liver and upper quadrant. The patient does not have a gallbladder. The patient is not taking any Tylenol on a regular basis or any other hepatotoxic drugs as far  as we can tell.  3.  Peripheral vascular disease. The patient is status post femoral bypass surgery and she takes Coumadin. Monitor Coumadin levels as she is having changes in her LFTs. INR is pending at this moment. 4.  Hypertension, seems to be appropriately manage, continue Avalide and monitor closely.  5.  Gastrointestinal prophylaxis. The patient has significant history of gastroesophageal reflux disease and has occasional cough due to gastroesophageal reflux disease. We are going to keep her on a proton pump inhibitor twice daily.  6.  Deep vein thrombosis prophylaxis. She is already on Coumadin pending INR.  7.  Plan to keep her in observation to rule out acute coronary syndrome, treated with steroids for possible costochondritis. Follow cardiac markers and LFTs.   TIME SPENT:  I spent about 45 minutes with this patient.  ____________________________ Rose Sink, MD rsg:jm D: 10/27/2013 15:15:53 ET T: 10/27/2013 15:51:38 ET JOB#: [id number]  cc: [place] Sink, MD, <Dictator> Daveion Robar America Brown MD ELECTRONICALLY SIGNED 10/28/2013 12:24

## 2015-03-22 NOTE — H&P (Signed)
PATIENT NAME:  Lori Johnson, Lori Johnson MR#:  [id number] DATE OF BIRTH:  24-Jun-1930  DATE OF ADMISSION:  04/27/2013  PRIMARY CARE PHYSICIAN:  [name] at [facility].   CHIEF COMPLAINT:  Increasing shortness of breath for 2 to 3 days.  HISTORY OF PRESENT ILLNESS:  The patient is a pleasant 81 year old Caucasian female with severe PVD, status post right lower extremity bypass surgery for peripheral vascular disease about a month ago at [facility], a history of hyperlipidemia, CAD status post stent in the remote past, hypertension, comes to the Emergency Room with increasing shortness of breath, PND, orthopnea for the last couple of days and more so since yesterday with mild leg edema. The patient reports having a "viral syndrome" for the last couple of days of feeling tired, fatigued, low-grade fever without any cough or congestion.   In the Emergency Room, the patient is hemodynamically stable. She was found with her above symptoms to have mild acute congestive heart failure in the setting of viral syndrome. She is going to be admitted for overnight observation at this time for further evaluation and management.   PAST MEDICAL HISTORY:  1.  A history of severe peripheral vascular disease with arterial bypass 03/27/2013 at [facility].  2.  A history of coronary artery disease, status post stent in the remote past.  3.  Hypertension.  4.  Hyperlipidemia.  5.  A remote history of smoking.  6.  Chronic anemia.   ALLERGIES:  CODEINE, PENICILLIN and SULFA.   MEDICATIONS:  1.  Amlodipine 10 mg daily.  2.  Aspirin 81 mg daily.  3.  Cilostazol 50 mg b.i.d.  4.  Claritin 10 mg daily.  5.  Docusate 100 mg at bedtime.  6.  Ferrous sulfate 325 mg p.o. daily.  7.  Oxycodone 5 mg 1 to 2 capsule every 4 hours as needed.  8.  Pravastatin 20 mg daily.  9.  Prevacid 30 mg delayed-release 1 capsule daily.  10.  ProAir HFA 2 puffs inhaled every 4 hours as needed.  11.  Tylenol 500 mg 2 tablets  every 6 hours as needed for pain.  12.  Warfarin 5 mg 1 tablet in the evening.   REVIEW OF SYSTEMS: CONSTITUTIONAL:  No fever. Positive for fatigue, weakness.  EYES:  No blurred or double vision. No cataracts or glaucoma.  ENT:  No tinnitus, ear pain, hearing loss or epistaxis.  RESPIRATORY:  Positive for shortness of breath. No cough, wheeze, hemoptysis.  CARDIOVASCULAR:  No chest pain. Positive for orthopnea, edema and dyspnea on exertion.  GASTROINTESTINAL:  No nausea, vomiting, diarrhea, abdominal pain, no hematemesis.  GENITOURINARY:  No dysuria, hematuria or renal calculus.  ENDOCRINE:  No polyuria, nocturia or thyroid problems.  HEMATOLOGY:  No anemia or easy bruising.  SKIN:  No acne or rash.  MUSCULOSKELETAL:  Positive for arthritis.  NEUROLOGIC:  No CVA, TIA, numbness or vertigo.  PSYCHIATRIC:  No anxiety or depression. All other systems reviewed and negative.   SOCIAL HISTORY:  The patient lives with her daughter, ex-smoker, nonalcoholic. The patient quit about 30 years ago.   FAMILY HISTORY:  Positive for hypertension and coronary artery disease in father.   PHYSICAL EXAMINATION:  GENERAL:  The patient is awake, alert, oriented x 3, not in acute distress.  VITAL SIGNS:  Afebrile, pulse is 74. Blood pressure is 132/60. Sats are 99% on 2 L.  HEENT:  Atraumatic, normocephalic. Pupils PERRLA, EOM intact. Oral mucosa is moist.  NECK:  Supple. No JVD. No carotid bruit.  LUNGS:  Clear to auscultation bilaterally. Decreased breath sounds in the bases. Sats are 99% on 2 L.  CARDIOVASCULAR:  Both the heart sounds are normal. Rate, rhythm regular. PMI not lateralized. CHEST:  Nontender.  EXTREMITIES:   1+ pitting edema in both lower extremities. There is a well-healing scar present in the right lower extremity.  NEUROLOGIC:  Grossly intact cranial nerves II through XII. No motor or sensory deficit.  SKIN:  Warm and dry.  PSYCHIATRIC:  The patient is awake, alert, oriented x 3.    LABORATORY, DIAGNOSTIC, AND RADIOLOGICAL DATA:  EKG shows sinus rhythm. H and H is 8.8 and 26.5. White count is 13.0, platelet count is 149. B-type natriuretic peptide is 14,104. Troponin is 0.03. Glucose is 113, BUN is 24, creatinine is 1.7, sodium 136, potassium 4.4, chloride 104, bicarb 22, calcium is 8.9. LFTs within normal limits. PT-INR is 20.3 and 1.0. Chest x-ray shows mild cardiomegaly, no acute cardiopulmonary disease.   ASSESSMENT:  An 81 year old patient with a history of severe peripheral vascular disease, hypertension, and coronary artery disease, comes in with:  1.  Increasing shortness of breath, orthopnea, paroxysmal nocturnal dyspnea, and mild leg edema with elevated BNP suggestive of congestive heart failure, new onset, suspect diastolic with echocardiogram pending in the setting of resent viral syndrome. We will admit the patient to telemetry floor for a one-night observation, give a couple doses of IV Lasix, watch ins and outs, monitor creatinine. We will order echocardiogram in the morning. Cardiac enzymes x 3. I doubt the patient has pulmonary embolism given she has been on Coumadin with therapeutic INR, however, VQ scan has been ordered by Emergency Room MD. We will get it done in the morning.  2.  Viral syndrome. The patient had mild fever with fatigability and joint aches. Supportive treatment will be provided.  3.  Hypertension. Continue amlodipine.  4.  Severe peripheral vascular disease with bypass surgery at  [facility] about a month ago. Continue aspirin and Celestone.  5.  Hyperlipidemia. On atorvastatin.  6.  Chronic anticoagulation due to severe peripheral vascular disease. We will resume warfarin home dose 5 mg q. 5:00 p.m. It was held for the last 2 days due to elevated INR by primary MD.  7.  Deep vein thrombosis prophylaxis: The patient already on warfarin.  8.  Further workup according to the patient's clinical course. Hospital admission plan was discussed with the  patient's daughter and the patient, who is agreeable to it.   CODE STATUS:  THE PATIENT IS A FULL CODE.   TIME SPENT: 50 minutes.   ____________________________ Hart Rochester Posey Pronto, MD sap:jm D: 04/27/2013 17:31:17 ET T: 04/27/2013 18:04:42 ET JOB#: [id number]  cc: Allene Furuya A. Posey Pronto, MD, <Dictator> Vickki Muff. Chrismon, PA Ilda Basset MD ELECTRONICALLY SIGNED 05/04/2013 19:35

## 2015-03-22 NOTE — Discharge Summary (Signed)
PATIENT NAME:  Lori Johnson, FUSILIER MR#:  [id number] DATE OF BIRTH:  [birth year]  DATE OF ADMISSION:  04/27/2013 DATE OF DISCHARGE:  04/28/2013  For a detailed note, please take a look at the history and physical done on admission by Dr. Fritzi Mandes.   DIAGNOSES AT DISCHARGE: As follows: Acute congestive heart failure, likely diastolic in nature. History of severe peripheral vascular disease. Hypertension. GERD. Hyperlipidemia.   DIET: The patient was discharged on a low-sodium, low-fat diet.   ACTIVITY: As tolerated.   FOLLOWUP: In the next 1 to 2 weeks with [name], PA.    DISCHARGE MEDICATIONS: Amlodipine 10 mg daily, oxycodone 5 mg 1 to 2 tabs q.4 hours as needed, Tylenol 500 mg 2 tabs q.6 hours as needed, Claritin 10 mg daily, Pravachol 20 mg daily, warfarin 5 mg daily, albuterol inhaler 2 puffs q.4 hours as needed, cilostazol (which is Pletal) 50 mg b.i.d., nystatin topical powder to be applied 4 times daily as needed for the rash, aspirin 81 mg daily, Colace 100 mg at bedtime, iron sulfate 325 mg daily, hydrochlorothiazide/losartan 12.5/50 one tab daily and Lasix 20 mg daily as needed for lower extremity edema or shortness of breath.   PERTINENT STUDIES DONE DURING THE HOSPITAL COURSE: As follows: A chest x-ray done on admission showing mild cardiomegaly with no acute cardiopulmonary disease. A V/Q scan done on May 30th showing low probability for pulmonary embolism. A 2-dimensional echocardiogram done showing left ventricular ejection fraction to be 60% to 65%, normal global LV function, mild mitral valve regurgitation, mild tricuspid regurgitation.   BRIEF HOSPITAL COURSE: This is an 81 year old female with medical problems as mentioned above, presented to the hospital on 04/27/2013 secondary to shortness of breath, lower extremity edema and orthopnea and noted to have possible suspected acute CHF.    PROBLEMS:  1. Acute congestive heart failure. This was likely the cause of the patient's  shortness of breath, orthopnea and paroxysmal nocturnal dyspnea. This was acute diastolic dysfunction as the patient's echocardiogram showed normal LV function. The patient received a few doses of IV Lasix and responded well to them and clinically feels much improved now. She is being discharged home on some Lasix as needed. She was not scheduled on regular Lasix as the creatinine did bump up after diuresis. She will continue her Norvasc, losartan/HCTZ and will take Lasix on as needed basis if she were to develop some weight gain or worsening shortness of breath.  2. Acute on chronic renal failure. The patient does have chronic kidney disease. Creatinine bumped up after some IV Lasix doses due to her CHF. For now, she will not take any Lasix on a regular basis. I did discharge on her Lasix on an as needed basis. Her creatinine should be followed by her primary care physician as an outpatient, and I also think that she would benefit from a referral to a nephrologist as an outpatient and she would prefer to get a referral from her primary care physician.  3. Hypertension. The patient remained hemodynamically stable on her Norvasc and losartan/HCTZ. She will resume that.  4. Hyperlipidemia. The patient was maintained on her Pravachol. She will resume that.  5. History of severe peripheral vascular disease. The patient will continue her Coumadin, statin and Pletal as stated.   CODE STATUS: The patient is a FULL CODE.   DISPOSITION: She is being discharged home.   TIME SPENT DISCHARGE: 40 minutes.   ____________________________ Belia Heman. Verdell Carmine, MD vjs:gb D: 04/28/2013 16:05:21 ET T:  04/28/2013 23:00:38 ET JOB#: [id number]  cc: Belia Heman. Verdell Carmine, MD, <Dictator> Vickki Muff. Chrismon, PA Henreitta Leber MD ELECTRONICALLY SIGNED 05/07/2013 20:33

## 2015-03-22 NOTE — Consult Note (Signed)
PATIENT NAME:  Lori Johnson, Lori Johnson MR#:  [id number] DATE OF BIRTH:  [birth year]  DATE OF CONSULTATION:  10/28/2013  CONSULTING PHYSICIAN:  Manya Silvas, MD  HISTORY OF PRESENT ILLNESS: The patient is an 81 year old white female who presented to the ER with localized right upper quadrant pain and tenderness that has been bothering her for 2 weeks with increasing intensity. She could not twist or move hardly without the pain being severe. The pain appeared to be musculoskeletal in nature; however, she was noted to have a very significantly elevated troponin of 0.85. She also had some wide complex tachycardia in the face of atrial fibrillation. Because of the blood tests, EKG and pain, she was admitted to the hospital.   An ultrasound of the abdomen showed a very dilated common bile duct of 1.9 cm. She has had previous gallbladder removal. I was asked to see her for the abdominal pain.   Cardiology has evaluated her and felt she has likely demand ischemia. There was a slight elevation in CPK-MB as well as AST and ALT.   REVIEW OF SYSTEMS: No dysphagia. No nausea, vomiting. Her bowel movement are fine. She is not passing any blood and no melena. She took tramadol and Tylenol for this pain, and it did not help any. She has a great improvement in the pain in the last 24 to 48 hours, but she says when she had her ultrasound, the sonar probe pressing on her abdomen and lower ribs caused significant discomfort.   PAST MEDICAL HISTORY: Hypertension. Multiple peripheral vascular disease problems with intervention. She has had stents put in and grafts in her right leg especially, almost down to the ankle. Dr. Deon Pilling has put stents in her. Byrnett, has done femoral bypass surgery on her. She had stents placed at [facility] earlier this year. She does have a history of some COPD. She had an upper endoscopy with Dr. Candace Cruise in April 2013 that showed a normal esophagus and a small hiatal hernia present.   HABITS: Used to  smoke for 25 years, quit 20 years ago. No alcohol history.  ALLERGIES: CODEINE, PENICILLIN AND SULFA.   MEDICATIONS: Include:  1. Warfarin 5 mg a day. 2. Tylenol p.r.n. 3. ProAir p.r.n. for shortness of breath.  4. Pravastatin 20 mg a day.  5. Oxycodone p.r.n., but she has not been taking it lately. 6. Nitro-Tab as needed for chest pain.  7. Losartan with hydrochlorothiazide 12.5/50 once a day.  8. Furosemide 20 mg a day.  9. Ferrous sulfate 325 mg daily.  10. Docusate once a day. 11. Claritin 10 mg a day.  12. Aspirin 81 mg a day.  13. Amlodipine 10 mg a day   FAMILY HISTORY: Positive for coronary artery disease, father and sister   EXAMINATION:  GENERAL: Elderly white female in no acute distress, accompanied by her family in the room.  HEENT: Sclerae nonicteric. Conjunctivae negative.  NECK: Shows carotid bruit on the left.  CHEST: Clear.  HEART: Shows some irregularly irregular beats.  ABDOMEN: There is no hepatosplenomegaly. No masses. No bruits. There is some discrete tenderness focally in the right medial lower rib area, but she says it is significantly better than it was in the past. She does not have any pain with coughing.   LABORATORY DATA: Glucose 106, BUN 38, creatinine 2.03, sodium 135, potassium 4.5, alkaline phosphatase 144, AST 80, ALT 79. Troponin 0.7, CPK 5.5. White count 6.4, hemoglobin 10.2.   ASSESSMENT: I agree with the ER physician  that her pain seems to be musculoskeletal in nature, possibly from arthritis, possibly from a pulled muscle or stretched ligaments. It certainly seems to be in the ribcage itself. There is always a possibility of shingles, but the pain has been going on for almost 2 weeks now, so that is possible, but less likely because of that.   I am concerned about the significantly dilated common bile duct with the pain in that area. I cannot be certain that these two are not connected.   I discussed an MRCP with radiologist on call, and there  are some stents and shunts that would be a serious problem doing MRCP on.   PLAN: Will get a CAT scan of the abdomen and repeat her liver functions tomorrow. If her pain continues to subside and the CT is negative, I think she could possibly go home. It may take a few days to get records from [facility] on what kind of stents were placed, and we can see her in the office, and if her liver functions are still elevated and she still has pain, once the records are in, we could decide whether or not to do an MRCP or follow this with ultrasounds. We will get a CAT scan today without contrast to see if we can see anything in that area.   ____________________________ Manya Silvas, MD rte:lb D: 10/28/2013 [id number] ET T: 10/28/2013 13:14:51 ET JOB#: [id number]  cc: Manya Silvas, MD, <Dictator> [place], MD Corey Skains, MD Manya Silvas MD ELECTRONICALLY SIGNED 10/31/2013 15:04

## 2015-03-22 NOTE — Discharge Summary (Signed)
PATIENT NAME:  Lori Johnson, Lori Johnson MR#:  [id number] DATE OF BIRTH:  [birth year]  DATE OF ADMISSION:  10/27/2013 DATE OF DISCHARGE:  10/29/2013  ADMITTING DIAGNOSIS: Chest pain.  DISCHARGE DIAGNOSES:  1.  Chest pain with elevated troponin, likely demand ischemia, per cardiology.  2.  Right upper quadrant abdominal pain with dilated common bile duct, with no obvious obstruction.  3.  Hypertension, poorly controlled due to pain. 4.  History of coronary artery disease, status post stents.  5.  History of peripheral vascular disease, status post femoral bypass and stents.   6.  Chronic kidney disease. 7.  Gastroesophageal reflux disease.  8.  Chronic obstructive pulmonary disease versus asthma.   DISCHARGE CONDITION: Stable.   DISCHARGE MEDICATIONS: The patient is to resume her outpatient medications. 1.  Icaps 1 capsule once daily.  2.  Amlodipine 10 mg p.o. daily.  3.  Tylenol 500 mg 2 capsules every six hours as needed.  4.  Claritin 10 mg p.o. daily.  5.  Pravastatin 10 mg p.o. daily.  6.  Warfarin 5 mg p.o. daily.  7.  ProAir HFA 2 puffs every four hours as needed.  8.  Aspirin 81 mg p.o. daily.  9.   Docusate sodium 100 mg p.o. daily.  10.  Furosemide 20 mg p.o. daily as needed.  11.  Lansoprazole 30 mg p.o. daily.  12.  Doxycycline 100 mg p.o. 1 capsule daily.  13.  Multivitamin 1 tablet once daily.  14.  Formoterol-mometasone  2 puffs once daily as needed.  15.  Acetaminophen hydrocodone 325/5 mg 1 tablet every four hours as needed.  16.  Hydrochlorothiazide losartan 12.5/50, 1 tablet twice daily.  17.  Lidocaine topical film, apply to affected area once daily.  18.  Senna 1 tablet once daily as needed.  19.  Metoprolol extended release 25 mg p.o. daily.   HOME OXYGEN: None.   DIET: 2 grams salt, low fat, low cholesterol, regular consistency.   ACTIVITY LIMITATIONS: As tolerated.   FOLLOWUP APPOINTMENT: With Dr. Allen Norris in two days after discharge, Dr. Nehemiah Massed in two days after  discharge.   CONSULTANTS: Dr. Rayann Heman, Dr. Nehemiah Massed, Dr. Vira Agar.   RADIOLOGIC STUDIES: Chest, portable, single view on 27 October 2013, revealed bibasilar hazy opacities, right lobe atelectasis, enlarged cardiac silhouette, according to radiology. CT scan of abdomen without contrast on 28 October 2013 showed common bile duct markedly dilated. No obvious pancreatic head mass, abdominal aortic aneurysm, maximal diameter is 3.5 cm. Recommend follow-up by ultrasound in two years. Recommendations to follow ACR consensus guidelines Ultrasound of abdomen, limited survey, 27 October 2013, revealed dilated common bile duct to 19 mm, increased from prior CT, when it measured 12 cm. The duct was obscured. Consider a distal duct stone, if there are symptoms of biliary obstruction. This could be further evaluated with ERCP or MRCP, status post cholecystectomy and normal liver. Echocardiogram 28 October 2013, left ventricular ejection fraction by visual estimation 55% to 60%, normal global left ventricular systolic function, mildly dilated left atrium, mildly dilated right atrium, mild to moderate mitral valve regurgitation, mild to moderate tricuspid regurgitation, mildly increased left ventricular posterior wall thickness.   HISTORY OF PRESENT ILLNESS: The patient is a Caucasian female with past medical history significant for history of extensive vascular problems including coronary artery disease, peripheral vascular disease, who presents to the hospital with complaints of chest pains as well as right upper quadrant abdominal pains. Please refer to Dr. Olene Craven admission note on 27 October 2013.  On arrival to the hospital, the patient's blood pressure was 129/62, pulse was 65, respiratory rate was 18, temperature was 98.4. Oxygen saturation was 98% on room air. Physical examination was remarkable for abdominal discomfort on palpation, significant exquisite tenderness at the right rib cage. The patient had  guarding, but otherwise no significant other changes were noted.   The patient's lab data, done on arrival to the Emergency Room, showed elevation of BUN and creatinine to 38 and 2.03, sodium 135, glucose 106. Otherwise, BMP was unremarkable. The patient's lipase level was normal at 134. The patient's liver enzymes revealed elevation of alkaline phosphatase to 144, AST was 81, ALT was 79. Troponin was elevated to 0.71, CK MB fraction was 5.5 on the first set. The second set revealed troponin elevation to 0.884, CK-MB fraction was 5.3, first set of troponins of 0.7, and CK-MB was 4.0. White blood cell count was normal at 6.4, hemoglobin was 10.2, and platelet count was 204. Absolute neutrophil count was within normal limits. The patient's coagulation evaluation revealed a pro time of 23.6. INR was 2.2. Activated PTT was also elevated at 39.4. Urinalysis was unremarkable.   The patient was admitted to the hospital for further evaluation. Because of her chest pains as well as elevated troponin, consultation with Dr. Nehemiah Massed was obtained. Dr. Nehemiah Massed saw the patient in consultation on the next day, 28 October 2013. He felt that the patient had chest pains with elevation of troponin, consistent with demand ischemia, with wide-complex tachycardia and history of coronary artery disease. He recommended to continue checking EKGs as well as enzymes to assess him for myocardial infarction or demand ischemia. Recommended to get echocardiogram done for left ventricular systolic dysfunction evaluation as well as valvular heart disease evaluation. He recommended to continue Lidoderm patch for atypical musculoskeletal pain. He also recommended to consider treadmill or Lexiscan infusion stress test versus cardiac catheterization, depending on her chest pains or other symptoms with ambulation.   The patient was ambulated, and her pain subsided, and she was reassessed by Dr. Nehemiah Massed the next day, 29 October 2013. He felt  that the patient had atypical chest pain with elevation of troponin and but normal EKG, suggesting demand ischemia due to illness rather than acute coronary syndrome. He acknowledged that the patient's echo showed normal left ventricular function with ejection fraction of 60%. He recommended no further cardiac diagnostics at this time, ambulate the patient and follow for other symptoms, and discharge to home if it is okay with regard her other medical issues.   The patient her right upper quadrant abdominal pains. She was consulted by Dr. Vira Agar. Dr. Vira Agar saw the patient in consultation 28 October 2013. He was concerned about significant dilated common bile duct with pain, and was not sure if her pain in the abdomen and dilation of common bile duct are somehow connected. He discussed MRCP with the radiologist on call, and since the patient had stents placed, we were not able to perform MRCP, since we did not have enough information about her stents. He felt that, since the patient's pain had somewhat subsided, and since the CT scan of abdomen was negative, the patient would likely be able to return back home and have all records sent from [facility] as well as [facility], to see what kind of stents were placed, and then re-evaluate patient in the office and make decisions about MRCP or ERCP.  The patient was reassessed on 29 October 2013 by Dr. Thurmond Butts, in regards  to her right upper quadrant abdominal pains, and since she was pain-free and her LFTs normalized, he felt that the patient was safe to discharge home. He recommended to visit the office in approximately one week, and make decisions about the next steps, MRCP versus endoscopic ultrasound versus ERCP. The patient is being discharged home today on 29 October 2013. It would be prudent for her to undergo stress testing as an outpatient and then possibly get her old liver studies, including, if needed ERCP, done. On the day of discharge, the  patient's vital signs were stable.  Her temperature was 97.9, pulse was 60s, respiration was 18, blood pressure ranging from 0000000 to Q000111Q systolic and Q000111Q diastolic. Her oxygen saturations were 95% to 97% on room air at rest.   TIME SPENT: 40 minutes.     ____________________________ Theodoro Grist, MD rv:cg D: 10/29/2013 17:54:59 ET T: 10/30/2013 00:57:43 ET JOB#: [id number]  cc: Theodoro Grist, MD, <Dictator> Lucilla Lame, MD Corey Skains, MD  [place] MD ELECTRONICALLY SIGNED 11/08/2013 15:32

## 2015-03-22 NOTE — Consult Note (Signed)
Details:   - GI follow up:  Abd pain resolved today.  Tolerating PO.  LFT have normalized.   CT and u/s both show 2 cm CBD of unclear etiology.    Could not get records from [facility] today so cannot safely do MRCP today.   Given she is pain free and LFT normal, I think it is safe to d/c home today.  I went over cholangitis precautions with her and family.   We will sched office visit in about one week to decide on next steps ( MRCP vs EUS vs ERCP).   Electronic Signatures: Arther Dames (MD)  (Signed [phone] 15:38)  Authored: Details   Last Updated: 30-Nov-14 15:38 by Arther Dames (MD)

## 2015-03-27 DIAGNOSIS — Z95828 Presence of other vascular implants and grafts: Secondary | ICD-10-CM | POA: Diagnosis not present

## 2015-03-27 DIAGNOSIS — I10 Essential (primary) hypertension: Secondary | ICD-10-CM | POA: Diagnosis not present

## 2015-03-27 DIAGNOSIS — Z87891 Personal history of nicotine dependence: Secondary | ICD-10-CM | POA: Diagnosis not present

## 2015-03-27 DIAGNOSIS — M7989 Other specified soft tissue disorders: Secondary | ICD-10-CM | POA: Diagnosis not present

## 2015-03-27 DIAGNOSIS — I2510 Atherosclerotic heart disease of native coronary artery without angina pectoris: Secondary | ICD-10-CM | POA: Diagnosis not present

## 2015-03-31 ENCOUNTER — Emergency Department
Admission: EM | Admit: 2015-03-31 | Discharge: 2015-03-31 | Disposition: A | Discharge status: Home / Self Care | Payer: Medicare PPO | Attending: Emergency Medicine | Admitting: Emergency Medicine

## 2015-03-31 ENCOUNTER — Emergency Department: Payer: Medicare PPO

## 2015-03-31 ENCOUNTER — Encounter: Payer: Self-pay | Admitting: *Deleted

## 2015-03-31 DIAGNOSIS — Z880 Allergy status to penicillin: Secondary | ICD-10-CM | POA: Diagnosis not present

## 2015-03-31 DIAGNOSIS — Y92091 Bathroom in other non-institutional residence as the place of occurrence of the external cause: Secondary | ICD-10-CM | POA: Insufficient documentation

## 2015-03-31 DIAGNOSIS — M25461 Effusion, right knee: Secondary | ICD-10-CM | POA: Diagnosis not present

## 2015-03-31 DIAGNOSIS — Z87891 Personal history of nicotine dependence: Secondary | ICD-10-CM | POA: Insufficient documentation

## 2015-03-31 DIAGNOSIS — M1711 Unilateral primary osteoarthritis, right knee: Secondary | ICD-10-CM | POA: Insufficient documentation

## 2015-03-31 DIAGNOSIS — M2540 Effusion, unspecified joint: Secondary | ICD-10-CM

## 2015-03-31 DIAGNOSIS — X58XXXA Exposure to other specified factors, initial encounter: Secondary | ICD-10-CM | POA: Insufficient documentation

## 2015-03-31 DIAGNOSIS — S8991XA Unspecified injury of right lower leg, initial encounter: Secondary | ICD-10-CM | POA: Diagnosis present

## 2015-03-31 DIAGNOSIS — I10 Essential (primary) hypertension: Secondary | ICD-10-CM | POA: Diagnosis not present

## 2015-03-31 DIAGNOSIS — Y9301 Activity, walking, marching and hiking: Secondary | ICD-10-CM | POA: Insufficient documentation

## 2015-03-31 DIAGNOSIS — Y998 Other external cause status: Secondary | ICD-10-CM | POA: Insufficient documentation

## 2015-03-31 HISTORY — DX: Disorder of kidney and ureter, unspecified: N28.9

## 2015-03-31 HISTORY — DX: Unspecified macular degeneration: H35.30

## 2015-03-31 HISTORY — DX: Essential (primary) hypertension: I10

## 2015-03-31 LAB — BASIC METABOLIC PANEL
Anion gap: 12 (ref 5–15)
BUN: 39 mg/dL — ABNORMAL HIGH (ref 6–20)
CHLORIDE: 103 mmol/L (ref 101–111)
CO2: 23 mmol/L (ref 22–32)
Calcium: 9 mg/dL (ref 8.9–10.3)
Creatinine, Ser: 2.39 mg/dL — ABNORMAL HIGH (ref 0.44–1.00)
GFR calc non Af Amer: 18 mL/min — ABNORMAL LOW (ref >60)
GFR, EST AFRICAN AMERICAN: 20 mL/min — AB (ref >60)
GLUCOSE: 175 mg/dL — AB (ref 65–99)
POTASSIUM: 4.6 mmol/L (ref 3.5–5.1)
Sodium: 138 mmol/L (ref 135–145)

## 2015-03-31 LAB — CBC WITH DIFFERENTIAL/PLATELET
Basophils Absolute: 0.1 10*3/uL (ref 0–0.1)
Basophils Relative: 1 %
Eosinophils Absolute: 0.3 10*3/uL (ref 0–0.7)
Eosinophils Relative: 4 %
HCT: 34.6 % — ABNORMAL LOW (ref 35.0–47.0)
Hemoglobin: 11.3 g/dL — ABNORMAL LOW (ref 12.0–16.0)
Lymphs Abs: 1.8 10*3/uL (ref 1.0–3.6)
MCH: 30 pg (ref 26.0–34.0)
MCHC: 32.7 g/dL (ref 32.0–36.0)
MCV: 91.7 fL (ref 80.0–100.0)
MONO ABS: 0.8 10*3/uL (ref 0.2–0.9)
NEUTROS ABS: 4.2 10*3/uL (ref 1.4–6.5)
Neutrophils Relative %: 59 %
PLATELETS: 212 10*3/uL (ref 150–440)
RBC: 3.78 MIL/uL — ABNORMAL LOW (ref 3.80–5.20)
RDW: 13.3 % (ref 11.5–14.5)
WBC: 7.2 10*3/uL (ref 3.6–11.0)

## 2015-03-31 LAB — PROTIME-INR
INR: 3.29
Prothrombin Time: 33.5 seconds — ABNORMAL HIGH (ref 11.4–15.0)

## 2015-03-31 NOTE — ED Notes (Signed)
Triage and assessment completed at 1530.

## 2015-03-31 NOTE — Discharge Instructions (Signed)
Arthritis, Nonspecific °Arthritis is pain, redness, warmth, or puffiness (inflammation) of a joint. The joint may be stiff or hurt when you move it. One or more joints may be affected. There are many types of arthritis. Your doctor may not know what type you have right away. The most common cause of arthritis is wear and tear on the joint (osteoarthritis). °HOME CARE  °· Only take medicine as told by your doctor. °· Rest the joint as much as possible. °· Raise (elevate) your joint if it is puffy. °· Use crutches if the painful joint is in your leg. °· Drink enough fluids to keep your pee (urine) clear or pale yellow. °· Follow your doctor's diet instructions. °· Use cold packs for very bad joint pain for 10 to 15 minutes every hour. Ask your doctor if it is okay for you to use hot packs. °· Exercise as told by your doctor. °· Take a warm shower if you have stiffness in the morning. °· Move your sore joints throughout the day. °GET HELP RIGHT AWAY IF:  °· You have a fever. °· You have very bad joint pain, puffiness, or redness. °· You have many joints that are painful and puffy. °· You are not getting better with treatment. °· You have very bad back pain or leg weakness. °· You cannot control when you poop (bowel movement) or pee (urinate). °· You do not feel better in 24 hours or are getting worse. °· You are having side effects from your medicine. °MAKE SURE YOU:  °· Understand these instructions. °· Will watch your condition. °· Will get help right away if you are not doing well or get worse. °Document Released: 02/10/2010 Document Revised: 05/17/2012 Document Reviewed: 02/10/2010 °[employer]® Patient Information ©2015 [employer]. This information is not intended to replace advice given to you by your health care provider. Make sure you discuss any questions you have with your health care provider. ° °

## 2015-03-31 NOTE — ED Notes (Signed)
Pt presents via EMS on stretcher with c/o pain to right knee.

## 2015-03-31 NOTE — ED Provider Notes (Signed)
[facility] Emergency Department Provider Note    ____________________________________________  Time seen: Seen upon arrival.  I have reviewed the triage vital signs and the nursing notes.   HISTORY  Chief Complaint Knee Pain       HPI Lori Johnson is a 81 y.o. female who presents one half hours after sudden onset knee pain while walking in the bathroom. She says that she felt a pop to the medial right knee and has been able to walk since. She says over the past week she's been having increased swelling in her right lower extremity where she has a known DVT which was diagnosed about 2 years ago according to the patient. She takes warfarin chronically for this issue. She says she is compliant with her Coumadin. The pain right now is moderate and the patient was offered pain medication but declined. The pain medication is sharp and cramping. Walking on it makes it worse.     Past Medical History  Diagnosis Date  . Renal disorder   . Hypertension   . Macular degeneration     There are no active problems to display for this patient.   Past Surgical History  Procedure Laterality Date  . Arterial bypass right leg Right   . Cholecystectomy    . Abdominal hysterectomy      No current outpatient prescriptions on file.  Allergies Codeine; Erythromycin; Penicillins; Pletal; and Sulfa antibiotics  No family history on file.  Social History History  Substance Use Topics  . Smoking status: Former Research scientist (life sciences)  . Smokeless tobacco: Never Used  . Alcohol Use: No    Review of Systems  Constitutional: Negative for fever. Eyes: Negative for visual changes. ENT: Negative for sore throat. Cardiovascular: Negative for chest pain. Respiratory: Negative for shortness of breath. Gastrointestinal: Negative for abdominal pain, vomiting and diarrhea. Genitourinary: Negative for dysuria. Musculoskeletal: Negative for back pain.  Right knee pain with right  lower extremity swelling. Skin: Negative for rash. Neurological: Negative for headaches, focal weakness or numbness.   10-point ROS otherwise negative.  ____________________________________________   PHYSICAL EXAM:  VITAL SIGNS: ED Triage Vitals  Enc Vitals Group     BP 03/31/15 1544 95/62 mmHg     Pulse Rate 03/31/15 1544 60     Resp --      Temp 03/31/15 1544 98.9 F (37.2 C)     Temp Source 03/31/15 1544 Oral     SpO2 03/31/15 1544 97 %     Weight 03/31/15 1544 196 lb (88.905 kg)     Height 03/31/15 1544 5\' 4"  (1.626 m)     Head Cir --      Peak Flow --      Pain Score 03/31/15 1545 6     Pain Loc --      Pain Edu? --      Excl. in [place]? --      Constitutional: Alert and oriented. Well appearing and in no distress. Eyes: Conjunctivae are normal. PERRL. Normal extraocular movements. ENT   Head: Normocephalic and atraumatic.   Nose: No congestion/rhinnorhea.   Mouth/Throat: Mucous membranes are moist.   Neck: No stridor. Hematological/Lymphatic/Immunilogical: No cervical lymphadenopathy. Cardiovascular: Normal rate, regular rhythm. Normal and symmetric distal pulses are present in all extremities. No murmurs, rubs, or gallops. Respiratory: Normal respiratory effort without tachypnea nor retractions. Breath sounds are clear and equal bilaterally. No wheezes/rales/rhonchi. Gastrointestinal: Soft and nontender. No distention. No abdominal bruits. There is no CVA tenderness. Genitourinary: Deferred Musculoskeletal:  Right lower extremity with tender and swollen knee diffusely but with greatest tenderness to the medial aspect. There is no palpable effusion. There is mild to moderate edema to the bilateral lower extremities with the right greater than left. The patient has decreased strength to the right knee secondary to pain.  Dorsalis pedis pulses are intact bilaterally. The patient is sensate to light touch in bilateral lower extremities. Neurologic:  Normal  speech and language. No gross focal neurologic deficits are appreciated. Speech is normal. No gait instability. Skin:  Skin is warm, dry and intact. No rash noted. Psychiatric: Mood and affect are normal. Speech and behavior are normal. Patient exhibits appropriate insight and judgment.  ____________________________________________   EKG    ____________________________________________    RADIOLOGY  Right knee x-ray with arthritis with effusion.  ____________________________________________    ____________________________________________   INITIAL IMPRESSION / ASSESSMENT AND PLAN / ED COURSE  Pertinent labs & imaging results that were available during my care of the patient were reviewed by me and considered in my medical decision making (see chart for details).  Patient is resting comfortably in her hallway bed. Her symptoms are likely musculoskeletal in nature. She is able to range the knee and does not have any tenderness on pressure to the sole of the foot. I discussed with her family that is possible she may have a ligamentous injury but there is no ligamentous instability of the joint. The patient already has some degree of ambulatory dysfunction and has been walking with a walker. I'll give her a Ace wrap to the right knee and she'll need follow-up with orthopedics. I feel like immobilizer. This point would be more detrimental to this patient and will cause her a great deal of difficulty performing her ADLs without significant benefit to her acute condition. She will follow-up with orthopedics and has follow-up with her kidney doctor this week. I reviewed the kidney function with the family of 2.39 and her creatinine is as that is her baseline. I did review old records of [facility] which showed her most recent creatinine in 2014 of 1.6.  ____________________________________________   FINAL CLINICAL IMPRESSION(S) / ED DIAGNOSES  Right knee pain with effusion. Right knee arthritis.  Acute initial visit.   Doran Stabler, PA-C 03/31/15 1810

## 2015-05-02 ENCOUNTER — Other Ambulatory Visit: Payer: Self-pay | Admitting: Orthopedic Surgery

## 2015-05-02 ENCOUNTER — Other Ambulatory Visit: Payer: Self-pay | Admitting: Family Medicine

## 2015-05-02 DIAGNOSIS — M25561 Pain in right knee: Secondary | ICD-10-CM

## 2015-05-03 ENCOUNTER — Ambulatory Visit: Payer: Self-pay

## 2015-05-03 ENCOUNTER — Telehealth: Payer: Self-pay

## 2015-05-03 LAB — PROTIME-INR
INR: 5.7 — ABNORMAL HIGH (ref 0.8–1.2)
PROTHROMBIN TIME: 59.4 s — AB (ref 9.1–12.0)

## 2015-05-03 NOTE — Telephone Encounter (Signed)
-----   Message from Margo Common, [place] sent at 05/03/2015  5:54 PM EDT ----- INR very high again at 5.7. Goal is 2.0-3.0. Hold Coumadin for 3 days then go back on the 5 mg daily. Be sure to get some leafy green vegetables in diet and recheck protime in 1 week.

## 2015-05-03 NOTE — Telephone Encounter (Signed)
Advised daughter (Mrs. Mancel Bale) of patients recent PT/INR report as below. KW

## 2015-05-09 ENCOUNTER — Ambulatory Visit
Admission: RE | Admit: 2015-05-09 | Discharge: 2015-05-09 | Disposition: A | Discharge status: Home / Self Care | Payer: Medicare PPO | Source: Ambulatory Visit | Attending: Orthopedic Surgery | Admitting: Orthopedic Surgery

## 2015-05-09 DIAGNOSIS — M25561 Pain in right knee: Secondary | ICD-10-CM

## 2015-05-10 ENCOUNTER — Ambulatory Visit (INDEPENDENT_AMBULATORY_CARE_PROVIDER_SITE_OTHER): Payer: Medicare PPO

## 2015-05-10 DIAGNOSIS — I82409 Acute embolism and thrombosis of unspecified deep veins of unspecified lower extremity: Secondary | ICD-10-CM | POA: Diagnosis not present

## 2015-05-10 LAB — POCT INR
INR: 1.8
PT: 21.3

## 2015-05-10 NOTE — Progress Notes (Signed)
PT/INR today INR 1.8 PT 21.3  Discussed with [name] that pt stated that she had a nose bleed X 3 this am [name] assessed pt and instructed pt.  Per [name], advised pt to continue greens in her diet and to take one extra pill of coumadin 10 mg  today and then to take one pill every day Coumadin 5 mg. And to come back in 4 weeks for a PT/INR check. Pt advised to call if any questions or concerns.

## 2015-05-16 ENCOUNTER — Ambulatory Visit: Payer: Commercial Managed Care - HMO

## 2015-05-22 ENCOUNTER — Ambulatory Visit
Admission: RE | Admit: 2015-05-22 | Discharge: 2015-05-22 | Disposition: A | Discharge status: Home / Self Care | Payer: Medicare PPO | Source: Ambulatory Visit | Attending: Orthopedic Surgery | Admitting: Orthopedic Surgery

## 2015-05-22 DIAGNOSIS — M76899 Other specified enthesopathies of unspecified lower limb, excluding foot: Secondary | ICD-10-CM | POA: Diagnosis not present

## 2015-05-22 DIAGNOSIS — M659 Synovitis and tenosynovitis, unspecified: Secondary | ICD-10-CM | POA: Diagnosis not present

## 2015-05-22 DIAGNOSIS — S83241A Other tear of medial meniscus, current injury, right knee, initial encounter: Secondary | ICD-10-CM | POA: Diagnosis not present

## 2015-05-22 DIAGNOSIS — M25561 Pain in right knee: Secondary | ICD-10-CM | POA: Diagnosis present

## 2015-05-22 DIAGNOSIS — S83511A Sprain of anterior cruciate ligament of right knee, initial encounter: Secondary | ICD-10-CM | POA: Diagnosis not present

## 2015-05-22 DIAGNOSIS — M25461 Effusion, right knee: Secondary | ICD-10-CM | POA: Diagnosis not present

## 2015-05-22 DIAGNOSIS — S83521A Sprain of posterior cruciate ligament of right knee, initial encounter: Secondary | ICD-10-CM | POA: Diagnosis not present

## 2015-06-05 DIAGNOSIS — B372 Candidiasis of skin and nail: Secondary | ICD-10-CM | POA: Diagnosis not present

## 2015-06-05 DIAGNOSIS — D692 Other nonthrombocytopenic purpura: Secondary | ICD-10-CM | POA: Diagnosis not present

## 2015-06-05 DIAGNOSIS — L570 Actinic keratosis: Secondary | ICD-10-CM | POA: Diagnosis not present

## 2015-06-05 DIAGNOSIS — X32XXXA Exposure to sunlight, initial encounter: Secondary | ICD-10-CM | POA: Diagnosis not present

## 2015-06-06 DIAGNOSIS — E790 Hyperuricemia without signs of inflammatory arthritis and tophaceous disease: Secondary | ICD-10-CM | POA: Insufficient documentation

## 2015-06-06 DIAGNOSIS — K449 Diaphragmatic hernia without obstruction or gangrene: Secondary | ICD-10-CM | POA: Insufficient documentation

## 2015-06-06 DIAGNOSIS — I10 Essential (primary) hypertension: Secondary | ICD-10-CM | POA: Insufficient documentation

## 2015-06-06 DIAGNOSIS — E785 Hyperlipidemia, unspecified: Secondary | ICD-10-CM | POA: Insufficient documentation

## 2015-06-06 DIAGNOSIS — I7090 Unspecified atherosclerosis: Secondary | ICD-10-CM | POA: Insufficient documentation

## 2015-06-06 DIAGNOSIS — R221 Localized swelling, mass and lump, neck: Secondary | ICD-10-CM | POA: Insufficient documentation

## 2015-06-06 DIAGNOSIS — S5010XA Contusion of unspecified forearm, initial encounter: Secondary | ICD-10-CM | POA: Insufficient documentation

## 2015-06-06 DIAGNOSIS — Z86718 Personal history of other venous thrombosis and embolism: Secondary | ICD-10-CM | POA: Insufficient documentation

## 2015-06-06 DIAGNOSIS — S40029A Contusion of unspecified upper arm, initial encounter: Secondary | ICD-10-CM | POA: Insufficient documentation

## 2015-06-06 DIAGNOSIS — R2689 Other abnormalities of gait and mobility: Secondary | ICD-10-CM | POA: Insufficient documentation

## 2015-06-06 DIAGNOSIS — M21619 Bunion of unspecified foot: Secondary | ICD-10-CM | POA: Insufficient documentation

## 2015-06-06 DIAGNOSIS — Z95828 Presence of other vascular implants and grafts: Secondary | ICD-10-CM | POA: Insufficient documentation

## 2015-06-06 DIAGNOSIS — M1039 Gout due to renal impairment, multiple sites: Secondary | ICD-10-CM | POA: Insufficient documentation

## 2015-06-06 DIAGNOSIS — J45991 Cough variant asthma: Secondary | ICD-10-CM | POA: Insufficient documentation

## 2015-06-06 DIAGNOSIS — I889 Nonspecific lymphadenitis, unspecified: Secondary | ICD-10-CM | POA: Insufficient documentation

## 2015-06-06 DIAGNOSIS — M1990 Unspecified osteoarthritis, unspecified site: Secondary | ICD-10-CM | POA: Insufficient documentation

## 2015-06-06 DIAGNOSIS — N289 Disorder of kidney and ureter, unspecified: Secondary | ICD-10-CM | POA: Insufficient documentation

## 2015-06-06 DIAGNOSIS — I739 Peripheral vascular disease, unspecified: Secondary | ICD-10-CM | POA: Insufficient documentation

## 2015-06-06 DIAGNOSIS — R351 Nocturia: Secondary | ICD-10-CM | POA: Insufficient documentation

## 2015-06-06 DIAGNOSIS — B999 Unspecified infectious disease: Secondary | ICD-10-CM | POA: Insufficient documentation

## 2015-06-06 DIAGNOSIS — K219 Gastro-esophageal reflux disease without esophagitis: Secondary | ICD-10-CM | POA: Insufficient documentation

## 2015-06-07 ENCOUNTER — Ambulatory Visit (INDEPENDENT_AMBULATORY_CARE_PROVIDER_SITE_OTHER): Payer: Medicare PPO

## 2015-06-07 DIAGNOSIS — I82409 Acute embolism and thrombosis of unspecified deep veins of unspecified lower extremity: Secondary | ICD-10-CM | POA: Diagnosis not present

## 2015-06-07 LAB — POCT INR
INR: 5.6
PT: 66.7

## 2015-06-07 NOTE — Patient Instructions (Addendum)
Hold Coumadin for two days,(today and tomorrow) restart on Sunday Coumadin 5 mg Daily. Stop taking your aspirin. Come back in 1 week for PT/INR Call if any questions or concerns.

## 2015-06-14 ENCOUNTER — Ambulatory Visit (INDEPENDENT_AMBULATORY_CARE_PROVIDER_SITE_OTHER): Payer: Medicare PPO

## 2015-06-14 DIAGNOSIS — I82409 Acute embolism and thrombosis of unspecified deep veins of unspecified lower extremity: Secondary | ICD-10-CM | POA: Diagnosis not present

## 2015-06-14 LAB — POCT INR: INR: 3.3

## 2015-06-14 NOTE — Patient Instructions (Signed)
Continue same dose, Coumadin 5 mg daily Come back in 4 weeks. Call if any questions or concerns.

## 2015-06-24 DIAGNOSIS — M1710 Unilateral primary osteoarthritis, unspecified knee: Secondary | ICD-10-CM | POA: Insufficient documentation

## 2015-06-24 DIAGNOSIS — M23203 Derangement of unspecified medial meniscus due to old tear or injury, right knee: Secondary | ICD-10-CM | POA: Diagnosis not present

## 2015-06-24 DIAGNOSIS — M1711 Unilateral primary osteoarthritis, right knee: Secondary | ICD-10-CM | POA: Diagnosis not present

## 2015-06-24 DIAGNOSIS — S83249A Other tear of medial meniscus, current injury, unspecified knee, initial encounter: Secondary | ICD-10-CM | POA: Insufficient documentation

## 2015-06-24 DIAGNOSIS — S82143A Displaced bicondylar fracture of unspecified tibia, initial encounter for closed fracture: Secondary | ICD-10-CM | POA: Insufficient documentation

## 2015-06-24 DIAGNOSIS — M179 Osteoarthritis of knee, unspecified: Secondary | ICD-10-CM | POA: Insufficient documentation

## 2015-06-24 DIAGNOSIS — S82191A Other fracture of upper end of right tibia, initial encounter for closed fracture: Secondary | ICD-10-CM | POA: Diagnosis not present

## 2015-07-02 DIAGNOSIS — L570 Actinic keratosis: Secondary | ICD-10-CM | POA: Diagnosis not present

## 2015-07-02 DIAGNOSIS — X32XXXA Exposure to sunlight, initial encounter: Secondary | ICD-10-CM | POA: Diagnosis not present

## 2015-07-12 ENCOUNTER — Ambulatory Visit (INDEPENDENT_AMBULATORY_CARE_PROVIDER_SITE_OTHER): Payer: Medicare PPO

## 2015-07-12 DIAGNOSIS — I82409 Acute embolism and thrombosis of unspecified deep veins of unspecified lower extremity: Secondary | ICD-10-CM

## 2015-07-12 LAB — POCT INR
INR: 4.2
PT: 50.4

## 2015-07-12 NOTE — Patient Instructions (Signed)
Hold Coumadin for 2 days and then take 5 mg daily.  Come back in 1 week. Call if any questions or concerns.

## 2015-07-17 DIAGNOSIS — N184 Chronic kidney disease, stage 4 (severe): Secondary | ICD-10-CM | POA: Diagnosis not present

## 2015-07-17 DIAGNOSIS — I10 Essential (primary) hypertension: Secondary | ICD-10-CM | POA: Diagnosis not present

## 2015-07-17 DIAGNOSIS — N183 Chronic kidney disease, stage 3 (moderate): Secondary | ICD-10-CM | POA: Diagnosis not present

## 2015-07-17 DIAGNOSIS — R609 Edema, unspecified: Secondary | ICD-10-CM | POA: Diagnosis not present

## 2015-07-17 DIAGNOSIS — R600 Localized edema: Secondary | ICD-10-CM | POA: Diagnosis not present

## 2015-07-19 ENCOUNTER — Ambulatory Visit (INDEPENDENT_AMBULATORY_CARE_PROVIDER_SITE_OTHER): Payer: Medicare PPO

## 2015-07-19 DIAGNOSIS — I82409 Acute embolism and thrombosis of unspecified deep veins of unspecified lower extremity: Secondary | ICD-10-CM | POA: Diagnosis not present

## 2015-07-19 LAB — POCT INR
INR: 1.8
PT: 21

## 2015-07-19 NOTE — Patient Instructions (Signed)
Continue same dose Coumadin 5 mg daily. Come back in 4 week. Call if any questions or concerns.

## 2015-08-16 ENCOUNTER — Ambulatory Visit (INDEPENDENT_AMBULATORY_CARE_PROVIDER_SITE_OTHER): Payer: Medicare PPO

## 2015-08-16 DIAGNOSIS — I82409 Acute embolism and thrombosis of unspecified deep veins of unspecified lower extremity: Secondary | ICD-10-CM

## 2015-08-16 LAB — POCT INR
INR: 5.2
PT: 62.4

## 2015-08-16 NOTE — Patient Instructions (Signed)
Hold for 2 days and then continue same dose Coumadin 5 mg daily. Come back in 1 week. Call if any questions or concerns.

## 2015-08-20 DIAGNOSIS — L570 Actinic keratosis: Secondary | ICD-10-CM | POA: Diagnosis not present

## 2015-08-23 ENCOUNTER — Ambulatory Visit (INDEPENDENT_AMBULATORY_CARE_PROVIDER_SITE_OTHER): Payer: Medicare PPO

## 2015-08-23 DIAGNOSIS — Z7901 Long term (current) use of anticoagulants: Secondary | ICD-10-CM

## 2015-08-23 LAB — POCT INR
INR: 3.2
PT: 38.7

## 2015-08-23 NOTE — Patient Instructions (Signed)
Continue same dose Coumadin 5 mg daily. Come back in 4 week. Call if any questions or concerns.

## 2015-09-20 ENCOUNTER — Ambulatory Visit (INDEPENDENT_AMBULATORY_CARE_PROVIDER_SITE_OTHER): Payer: Medicare PPO

## 2015-09-20 DIAGNOSIS — I82409 Acute embolism and thrombosis of unspecified deep veins of unspecified lower extremity: Secondary | ICD-10-CM | POA: Diagnosis not present

## 2015-09-20 LAB — POCT INR
INR: 3.1
PT: 37

## 2015-09-20 NOTE — Patient Instructions (Signed)
Anticoagulation Dose Instructions as of 09/20/2015      Lori Johnson Tue Wed Thu Fri Sat   New Dose 5 mg 5 mg 5 mg 5 mg 5 mg 5 mg 5 mg    Description        Continue same dose Coumadin 5 mg daily. Come back in 4 week. Call if any questions or concerns.

## 2015-10-17 ENCOUNTER — Other Ambulatory Visit: Payer: Self-pay

## 2015-10-18 ENCOUNTER — Other Ambulatory Visit: Payer: Self-pay | Admitting: Family Medicine

## 2015-10-18 ENCOUNTER — Ambulatory Visit (INDEPENDENT_AMBULATORY_CARE_PROVIDER_SITE_OTHER): Payer: Medicare PPO | Admitting: Emergency Medicine

## 2015-10-18 DIAGNOSIS — Z13820 Encounter for screening for osteoporosis: Secondary | ICD-10-CM

## 2015-10-18 DIAGNOSIS — I82409 Acute embolism and thrombosis of unspecified deep veins of unspecified lower extremity: Secondary | ICD-10-CM | POA: Diagnosis not present

## 2015-10-18 LAB — POCT INR: INR: 2.2

## 2015-11-07 ENCOUNTER — Ambulatory Visit
Admission: RE | Admit: 2015-11-07 | Discharge: 2015-11-07 | Disposition: A | Discharge status: Home / Self Care | Payer: Medicare PPO | Source: Ambulatory Visit | Attending: Family Medicine | Admitting: Family Medicine

## 2015-11-07 DIAGNOSIS — Z13820 Encounter for screening for osteoporosis: Secondary | ICD-10-CM | POA: Insufficient documentation

## 2015-11-07 DIAGNOSIS — S7290XA Unspecified fracture of unspecified femur, initial encounter for closed fracture: Secondary | ICD-10-CM | POA: Diagnosis not present

## 2015-11-11 ENCOUNTER — Telehealth: Payer: Self-pay

## 2015-11-11 NOTE — Telephone Encounter (Signed)
-----   Message from Margo Common, [place] sent at 11/10/2015  9:20 PM EST ----- Risk for fractures low for the next 10 years. Recommend Calcium 1200 mg with vitamin D 800 mg qd to lower risk further. Recheck BMD in 2 years.

## 2015-11-11 NOTE — Telephone Encounter (Signed)
Patient advised as directed below. Patient verbalized understanding and agrees to plan of care.

## 2015-11-13 DIAGNOSIS — I129 Hypertensive chronic kidney disease with stage 1 through stage 4 chronic kidney disease, or unspecified chronic kidney disease: Secondary | ICD-10-CM | POA: Diagnosis not present

## 2015-11-13 DIAGNOSIS — N184 Chronic kidney disease, stage 4 (severe): Secondary | ICD-10-CM | POA: Diagnosis not present

## 2015-11-13 DIAGNOSIS — I10 Essential (primary) hypertension: Secondary | ICD-10-CM | POA: Diagnosis not present

## 2015-11-14 DIAGNOSIS — R911 Solitary pulmonary nodule: Secondary | ICD-10-CM | POA: Diagnosis not present

## 2015-11-14 DIAGNOSIS — R0602 Shortness of breath: Secondary | ICD-10-CM | POA: Diagnosis not present

## 2015-11-15 ENCOUNTER — Ambulatory Visit (INDEPENDENT_AMBULATORY_CARE_PROVIDER_SITE_OTHER): Payer: Medicare PPO

## 2015-11-15 DIAGNOSIS — I82409 Acute embolism and thrombosis of unspecified deep veins of unspecified lower extremity: Secondary | ICD-10-CM | POA: Diagnosis not present

## 2015-11-15 LAB — POCT INR
INR: 3.9
PT: 46.8

## 2015-11-15 NOTE — Patient Instructions (Signed)
Anticoagulation Dose Instructions as of 11/15/2015      Lori Johnson Tue Wed Thu Fri Sat   New Dose 5 mg 5 mg 5 mg 5 mg 5 mg 5 mg 5 mg    Description        Hold x 1 day and then resume mg daily.  Go back to dietary recommendations given to patient in the past, follow up in 1 week.

## 2015-11-21 ENCOUNTER — Other Ambulatory Visit: Payer: Self-pay | Admitting: Family Medicine

## 2015-11-21 NOTE — Telephone Encounter (Signed)
[place] Patient.

## 2015-11-21 NOTE — Telephone Encounter (Signed)
Find out why she requires a daily antibiotic; it is not clear from the chart.

## 2015-11-22 ENCOUNTER — Ambulatory Visit (INDEPENDENT_AMBULATORY_CARE_PROVIDER_SITE_OTHER): Payer: Medicare PPO

## 2015-11-22 DIAGNOSIS — I82409 Acute embolism and thrombosis of unspecified deep veins of unspecified lower extremity: Secondary | ICD-10-CM

## 2015-11-22 LAB — POCT INR
INR: 1.9
PT: 23.3

## 2015-11-22 NOTE — Patient Instructions (Signed)
Anticoagulation Dose Instructions as of 11/22/2015      Lori Johnson Tue Wed Thu Fri Sat   New Dose 5 mg 5 mg 5 mg 5 mg 5 mg 5 mg 5 mg    Description        No change, continue with 5 mg daily, follow up in 2 weeks.

## 2015-11-22 NOTE — Telephone Encounter (Signed)
Spoke to pt daughter, she had had a artrial graph in her right leg and [facility] had told her she needed to be on this to make sure she did not get an infection since she would be more susceptible to infection with this.

## 2015-11-22 NOTE — Telephone Encounter (Signed)
Need clarification from patient as to what antibiotic she is referring to and why she needs to take it daily? Left message for her to call office back. KW

## 2015-12-04 ENCOUNTER — Other Ambulatory Visit: Payer: Self-pay

## 2015-12-06 ENCOUNTER — Ambulatory Visit (INDEPENDENT_AMBULATORY_CARE_PROVIDER_SITE_OTHER): Payer: Medicare PPO | Admitting: Family Medicine

## 2015-12-06 ENCOUNTER — Ambulatory Visit: Payer: Medicare PPO

## 2015-12-06 ENCOUNTER — Encounter: Payer: Self-pay | Admitting: Family Medicine

## 2015-12-06 VITALS — BP 132/46 | HR 68 | Temp 98.5°F | Resp 16 | Wt 191.6 lb

## 2015-12-06 DIAGNOSIS — I10 Essential (primary) hypertension: Secondary | ICD-10-CM

## 2015-12-06 DIAGNOSIS — Z95828 Presence of other vascular implants and grafts: Secondary | ICD-10-CM | POA: Diagnosis not present

## 2015-12-06 DIAGNOSIS — N183 Chronic kidney disease, stage 3 unspecified: Secondary | ICD-10-CM

## 2015-12-06 DIAGNOSIS — Z23 Encounter for immunization: Secondary | ICD-10-CM | POA: Diagnosis not present

## 2015-12-06 DIAGNOSIS — I82409 Acute embolism and thrombosis of unspecified deep veins of unspecified lower extremity: Secondary | ICD-10-CM

## 2015-12-06 DIAGNOSIS — E785 Hyperlipidemia, unspecified: Secondary | ICD-10-CM | POA: Diagnosis not present

## 2015-12-06 LAB — POCT INR
INR: 3
PT: 35.5

## 2015-12-06 NOTE — Progress Notes (Signed)
Patient ID: Lori Johnson, female   DOB: Apr 08, 1930, 82 y.o.   MRN: [id number]   Patient: Lori Johnson Female    DOB: 02-21-1930   82 y.o.   MRN: [id number] Visit Date: 12/06/2015  Today's Provider: [name], PA   Chief Complaint  Patient presents with  . Hypertension  . Hyperlipidemia  . DVT  . Follow-up   Subjective:    Hypertension This is a chronic problem. The problem is controlled.  Hyperlipidemia This is a chronic problem. The problem is controlled.   Patient Active Problem List   Diagnosis Date Noted  . Closed fracture of tibial plateau 06/24/2015  . Current tear knee, medial meniscus 06/24/2015  . Arthritis of knee, degenerative 06/24/2015  . Angiopathy, peripheral ([place]) 06/06/2015  . Arterial vascular disease 06/06/2015  . Bunion 06/06/2015  . Cervical adenitis 06/06/2015  . Gout due to renal impairment of multiple sites 06/06/2015  . Chronic infection 06/06/2015  . Contusion of forearm 06/06/2015  . Arm bruise 06/06/2015  . Asthma, cough variant 06/06/2015  . Gastroesophageal reflux disease with hiatal hernia 06/06/2015  . H/O deep venous thrombosis 06/06/2015  . Bergmann's syndrome 06/06/2015  . H/O arterial bypass of lower limb 06/06/2015  . HLD (hyperlipidemia) 06/06/2015  . Benign hypertension 06/06/2015  . Elevated blood uric acid level 06/06/2015  . Disorder of kidney 06/06/2015  . Lump in neck 06/06/2015  . Excessive urination at night 06/06/2015  . Arthritis, degenerative 06/06/2015  . Poor balance 06/06/2015  . Peripheral blood vessel disorder ([place]) 06/06/2015  . Acid reflux 03/08/2013  . Atherosclerosis of native artery of extremity ([place]) 01/22/2013  . Chronic kidney disease 01/22/2013  . CAD in native artery 01/22/2013  . Essential (primary) hypertension 01/22/2013  . Arthralgia of hip or thigh 03/29/2006   Past Surgical History  Procedure Laterality Date  . Arterial bypass right leg Right   . Cholecystectomy    . Broken left foot Left  01/2002  . Stent in leg    . Abdominal hysterectomy  1981   Family History  Problem Relation Age of Onset  . Heart disease Mother   . Heart disease Father    Allergies  Allergen Reactions  . Pletal [Cilostazol] Swelling  . Erythromycin Hives  . Codeine Nausea Only and Nausea And Vomiting  . Erythromycin Base Itching and Rash  . Penicillins Hives, Itching and Rash  . Sulfa Antibiotics Rash     Previous Medications   ACETAMINOPHEN (TYLENOL ARTHRITIS PAIN PO)    Take by mouth.   ALBUTEROL (PROAIR HFA) 108 (90 BASE) MCG/ACT INHALER    Inhale into the lungs.   ALBUTEROL (PROVENTIL HFA;VENTOLIN HFA) 108 (90 BASE) MCG/ACT INHALER    Inhale into the lungs.   AMLODIPINE (NORVASC) 10 MG TABLET    Take by mouth.   ASPIRIN EC 81 MG TABLET    Take 81 mg by mouth.   FUROSEMIDE (LASIX) 40 MG TABLET    Take by mouth.   LANSOPRAZOLE (PREVACID) 30 MG CAPSULE    Take 30 mg by mouth.   LORATADINE (CLARITIN) 10 MG TABLET    Take 10 mg by mouth.   LOSARTAN-HYDROCHLOROTHIAZIDE (HYZAAR) 50-12.5 MG PER TABLET    Take by mouth.   MOMETASONE-FORMOTEROL (DULERA) 100-5 MCG/ACT AERO    Inhale into the lungs.   MOMETASONE-FORMOTEROL (DULERA) 100-5 MCG/ACT AERO    Inhale into the lungs.   MULTIPLE VITAMIN (MULTIVITAMIN) CAPSULE    Take by mouth.   NITROGLYCERIN (NITROSTAT) 0.4 MG SL TABLET  Place 0.4 mg under the tongue.   OXYCODONE-ACETAMINOPHEN (PERCOCET/ROXICET) 5-325 MG PER TABLET    Take by mouth.   PRAVASTATIN (PRAVACHOL) 20 MG TABLET    Take by mouth.   VITAMIN D, ERGOCALCIFEROL, (DRISDOL) 50000 UNITS CAPS CAPSULE    Take by mouth.   WARFARIN (COUMADIN) 5 MG TABLET    Take by mouth.    Review of Systems  Constitutional: Negative.   HENT: Negative.   Eyes: Negative.   Respiratory: Negative.   Cardiovascular: Negative.   Gastrointestinal: Negative.   Endocrine: Negative.   Genitourinary: Negative.   Musculoskeletal: Negative.   Skin: Negative.   Allergic/Immunologic: Negative.     Neurological: Negative.   Hematological: Negative.   Psychiatric/Behavioral: Negative.     Social History  Substance Use Topics  . Smoking status: Former Research scientist (life sciences)  . Smokeless tobacco: Never Used  . Alcohol Use: No   Objective:   BP 132/46 mmHg  Pulse 68  Temp(Src) 98.5 F (36.9 C) (Oral)  Resp 16  Wt 191 lb 9.6 oz (86.909 kg) Wt Readings from Last 3 Encounters:  12/06/15 191 lb 9.6 oz (86.909 kg)  10/01/14 202 lb (91.627 kg)  05/22/15 200 lb (90.719 kg)    Physical Exam  Constitutional: She is oriented to person, place, and time. She appears well-developed and well-nourished.  HENT:  Head: Normocephalic and atraumatic.  Eyes: Conjunctivae and EOM are normal.  Neck: Neck supple.  Cardiovascular: Normal rate and regular rhythm.   Pulmonary/Chest: Effort normal and breath sounds normal.  Abdominal: Soft. Bowel sounds are normal.  Musculoskeletal: She exhibits no edema.  Low back soreness over SI joints (L>R). Good pulses and no edema.  Neurological: She is alert and oriented to person, place, and time.      Assessment & Plan:     1. Deep vein thrombosis (DVT), unspecified laterality Stable on Coumadin 5 mg qd. Protime INR was 3.0 today. Continue present dosage and recheck in 1 month. - POCT INR  2. Chronic kidney disease, stage 3 (moderate) Had follow up with nephrologist (Dr. Candiss Norse) in December 2016 and states labs showed some changes in electrolytes. Will check CBC and CMP. Has next appointment with Dr. Candiss Norse on 02-05-16. - CBC with Differential/Platelet - COMPLETE METABOLIC PANEL WITH GFR  3. Need for influenza vaccination - Flu Vaccine QUAD 36+ mos PF IM (Fluarix & Fluzone Quad PF)  4. Essential (primary) hypertension Stable BP in good control. Tolerating Hyzaar and Amlodipine without side effects. Recheck routine labs and follow up pending reports.  5. HLD (hyperlipidemia) Still on Pravastatin without myalgias. Continue low fat diet and recheck lipid panel and  TSH today. - Lipid panel - TSH  6. H/O arterial bypass of lower limb No swelling or pain in lower legs. Has follow up appointment with vascular surgeon in [facility]

## 2015-12-06 NOTE — Patient Instructions (Signed)
No change, continue with 5 mg daily, follow up in 4 weeks.

## 2015-12-07 LAB — COMPREHENSIVE METABOLIC PANEL
A/G RATIO: 1.6 (ref 1.1–2.5)
ALT: 15 IU/L (ref 0–32)
AST: 26 IU/L (ref 0–40)
Albumin: 4.2 g/dL (ref 3.5–4.7)
Alkaline Phosphatase: 67 IU/L (ref 39–117)
BUN/Creatinine Ratio: 14 (ref 11–26)
BUN: 47 mg/dL — ABNORMAL HIGH (ref 8–27)
Bilirubin Total: 0.7 mg/dL (ref 0.0–1.2)
CALCIUM: 10.6 mg/dL — AB (ref 8.7–10.3)
CO2: 20 mmol/L (ref 18–29)
CREATININE: 3.38 mg/dL — AB (ref 0.57–1.00)
Chloride: 97 mmol/L (ref 96–106)
GFR, EST AFRICAN AMERICAN: 14 mL/min/{1.73_m2} — AB (ref >59)
GFR, EST NON AFRICAN AMERICAN: 12 mL/min/{1.73_m2} — AB (ref >59)
GLOBULIN, TOTAL: 2.6 g/dL (ref 1.5–4.5)
Glucose: 118 mg/dL — ABNORMAL HIGH (ref 65–99)
POTASSIUM: 4.2 mmol/L (ref 3.5–5.2)
SODIUM: 138 mmol/L (ref 134–144)
TOTAL PROTEIN: 6.8 g/dL (ref 6.0–8.5)

## 2015-12-07 LAB — CBC WITH DIFFERENTIAL/PLATELET
BASOS ABS: 0 10*3/uL (ref 0.0–0.2)
Basos: 0 %
EOS (ABSOLUTE): 0.2 10*3/uL (ref 0.0–0.4)
EOS: 3 %
Hematocrit: 30.8 % — ABNORMAL LOW (ref 34.0–46.6)
Hemoglobin: 10.1 g/dL — ABNORMAL LOW (ref 11.1–15.9)
IMMATURE GRANULOCYTES: 0 %
Immature Grans (Abs): 0 10*3/uL (ref 0.0–0.1)
Lymphocytes Absolute: 2.2 10*3/uL (ref 0.7–3.1)
Lymphs: 31 %
MCH: 30.3 pg (ref 26.6–33.0)
MCHC: 32.8 g/dL (ref 31.5–35.7)
MCV: 93 fL (ref 79–97)
MONOS ABS: 0.7 10*3/uL (ref 0.1–0.9)
Monocytes: 10 %
NEUTROS PCT: 56 %
Neutrophils Absolute: 3.8 10*3/uL (ref 1.4–7.0)
PLATELETS: 227 10*3/uL (ref 150–379)
RBC: 3.33 x10E6/uL — AB (ref 3.77–5.28)
RDW: 13.4 % (ref 12.3–15.4)
WBC: 6.9 10*3/uL (ref 3.4–10.8)

## 2015-12-07 LAB — LIPID PANEL
CHOLESTEROL TOTAL: 176 mg/dL (ref 100–199)
Chol/HDL Ratio: 4.9 ratio units — ABNORMAL HIGH (ref 0.0–4.4)
HDL: 36 mg/dL — AB (ref >39)
LDL Calculated: 102 mg/dL — ABNORMAL HIGH (ref 0–99)
TRIGLYCERIDES: 191 mg/dL — AB (ref 0–149)
VLDL Cholesterol Cal: 38 mg/dL (ref 5–40)

## 2015-12-07 LAB — TSH: TSH: 2.62 u[IU]/mL (ref 0.450–4.500)

## 2015-12-30 ENCOUNTER — Encounter: Payer: Self-pay | Admitting: Family Medicine

## 2015-12-30 ENCOUNTER — Ambulatory Visit (INDEPENDENT_AMBULATORY_CARE_PROVIDER_SITE_OTHER): Payer: Medicare PPO | Admitting: Family Medicine

## 2015-12-30 VITALS — BP 106/48 | HR 52 | Temp 97.8°F | Resp 16 | Wt 188.4 lb

## 2015-12-30 DIAGNOSIS — H9221 Otorrhagia, right ear: Secondary | ICD-10-CM | POA: Diagnosis not present

## 2015-12-30 LAB — POCT INR
INR: 3.7
Prothrombin Time: 44.5

## 2015-12-30 NOTE — Progress Notes (Signed)
Subjective:     Patient ID: Lori Johnson, female   DOB: Aug 14, 1930, 82 y.o.   MRN: [id number]  HPI  Chief Complaint  Patient presents with  . Ear Drainage    Patient comes in office today with concerns of bleeding in her right ear since Saturday. Patient is accompanied today with daughter who states that while patient was taking a bath Saturday she had cleaned her ear with a q-tip when bleeding began.   Reports hearing is mildy muffled (has tissue in her ear) but no pain with Q-tip use or immediate change in hearing.  Last INR on 1/6 of 3.0. She is anticoagulated due to hx of DVT and lower extremity bypass graft.    Review of Systems     Objective:   Physical Exam  Constitutional: She appears well-developed and well-nourished. No distress.  HENT:  R ear without tenderness on tragal palpation. 2/3 if TM is visualized and is intact and not inflamed.  Small pool of blood noted at base of ear canal       Assessment:    1. Ear bleeding, right - POCT INR    Plan:    Hold Coumadin for 2 days then resume at present dose. If bleeding still present, call for ENT referral.

## 2015-12-30 NOTE — Patient Instructions (Signed)
Hold Coumadin for two days then resume. If ear still bleeding, call for ENT referral.

## 2016-01-03 ENCOUNTER — Ambulatory Visit: Payer: Medicare PPO

## 2016-01-06 DIAGNOSIS — I10 Essential (primary) hypertension: Secondary | ICD-10-CM | POA: Diagnosis not present

## 2016-01-06 DIAGNOSIS — N179 Acute kidney failure, unspecified: Secondary | ICD-10-CM | POA: Diagnosis not present

## 2016-01-06 DIAGNOSIS — N184 Chronic kidney disease, stage 4 (severe): Secondary | ICD-10-CM | POA: Diagnosis not present

## 2016-01-13 ENCOUNTER — Ambulatory Visit (INDEPENDENT_AMBULATORY_CARE_PROVIDER_SITE_OTHER): Payer: Medicare PPO | Admitting: Family Medicine

## 2016-01-13 ENCOUNTER — Encounter: Payer: Self-pay | Admitting: Family Medicine

## 2016-01-13 VITALS — BP 122/58 | HR 64 | Temp 97.8°F | Resp 16 | Wt 189.0 lb

## 2016-01-13 DIAGNOSIS — H6121 Impacted cerumen, right ear: Secondary | ICD-10-CM

## 2016-01-13 DIAGNOSIS — R609 Edema, unspecified: Secondary | ICD-10-CM | POA: Diagnosis not present

## 2016-01-13 DIAGNOSIS — N184 Chronic kidney disease, stage 4 (severe): Secondary | ICD-10-CM | POA: Diagnosis not present

## 2016-01-13 DIAGNOSIS — I10 Essential (primary) hypertension: Secondary | ICD-10-CM | POA: Diagnosis not present

## 2016-01-13 NOTE — Progress Notes (Signed)
Subjective:     Patient ID: Lori Johnson, female   DOB: Apr 09, 1930, 82 y.o.   MRN: [id number]  HPI  Chief Complaint  Patient presents with  . Ear Pain    Patient reports that she still has a fullness in her right ear from 2 weeks ago. Patient reports that she has not been using any q-tips in her ear.  Denies any drainage that she is aware of.   Here in f/u of ov.of 1/30 for right ear bleeding. This resolved with holding of coumadin and not further use of Q-tips. Since then states she has been "hearing through a barrel" with no further drainage/bleeding. No cold or allergy sx. Accompanied by her daughter.   Review of Systems     Objective:   Physical Exam  Constitutional: She appears well-developed and well-nourished. No distress.  HENT:  Left Ear: Tympanic membrane normal.  Right ear without tragal tenderness, appearance of dried blood blocking T.M. Suspect this overlies cerumen. Superior 1/3 of TM does not appear to be inflamed. After gentle irrigation per Rachelle: TM is intact with a small amount of residual dried blood/cerumen at base of TM. Patient reports improvement in her hearing.       Assessment:    1. Cerumen debris on tympanic membrane of right ear - EAR CERUMEN REMOVAL    Plan:    Discussed use of ear wax softeners like Mineral oil to keep wax soft. Avoid Q-tips.

## 2016-01-13 NOTE — Patient Instructions (Signed)
Avoid Q-tips. May use Mineral oil drops to soften wax if this recurs.

## 2016-01-15 DIAGNOSIS — I10 Essential (primary) hypertension: Secondary | ICD-10-CM | POA: Diagnosis not present

## 2016-01-15 DIAGNOSIS — N179 Acute kidney failure, unspecified: Secondary | ICD-10-CM | POA: Diagnosis not present

## 2016-01-15 DIAGNOSIS — N184 Chronic kidney disease, stage 4 (severe): Secondary | ICD-10-CM | POA: Diagnosis not present

## 2016-01-15 DIAGNOSIS — N2581 Secondary hyperparathyroidism of renal origin: Secondary | ICD-10-CM | POA: Diagnosis not present

## 2016-01-15 DIAGNOSIS — E872 Acidosis: Secondary | ICD-10-CM | POA: Diagnosis not present

## 2016-01-27 DIAGNOSIS — L821 Other seborrheic keratosis: Secondary | ICD-10-CM | POA: Diagnosis not present

## 2016-01-27 DIAGNOSIS — X32XXXA Exposure to sunlight, initial encounter: Secondary | ICD-10-CM | POA: Diagnosis not present

## 2016-01-27 DIAGNOSIS — L570 Actinic keratosis: Secondary | ICD-10-CM | POA: Diagnosis not present

## 2016-01-27 DIAGNOSIS — Z08 Encounter for follow-up examination after completed treatment for malignant neoplasm: Secondary | ICD-10-CM | POA: Diagnosis not present

## 2016-01-27 DIAGNOSIS — Z85828 Personal history of other malignant neoplasm of skin: Secondary | ICD-10-CM | POA: Diagnosis not present

## 2016-01-31 ENCOUNTER — Ambulatory Visit: Payer: Medicare PPO

## 2016-02-01 ENCOUNTER — Other Ambulatory Visit: Payer: Self-pay | Admitting: Family Medicine

## 2016-02-07 ENCOUNTER — Ambulatory Visit (INDEPENDENT_AMBULATORY_CARE_PROVIDER_SITE_OTHER): Payer: Medicare PPO

## 2016-02-07 DIAGNOSIS — I82409 Acute embolism and thrombosis of unspecified deep veins of unspecified lower extremity: Secondary | ICD-10-CM

## 2016-02-07 LAB — POCT INR
INR: 1.5
PT: 18.2

## 2016-02-07 NOTE — Patient Instructions (Signed)
Anticoagulation Dose Instructions as of 02/07/2016      Dorene Grebe Tue Wed Thu Fri Sat   New Dose 5 mg 5 mg 5 mg 5 mg 5 mg 5 mg 5 mg    Description        Double dose x 2 days then continue with 5 mg daily, follow up in 2 weeks.

## 2016-02-17 DIAGNOSIS — N184 Chronic kidney disease, stage 4 (severe): Secondary | ICD-10-CM | POA: Diagnosis not present

## 2016-02-17 DIAGNOSIS — I10 Essential (primary) hypertension: Secondary | ICD-10-CM | POA: Diagnosis not present

## 2016-02-17 DIAGNOSIS — R609 Edema, unspecified: Secondary | ICD-10-CM | POA: Diagnosis not present

## 2016-02-17 DIAGNOSIS — I129 Hypertensive chronic kidney disease with stage 1 through stage 4 chronic kidney disease, or unspecified chronic kidney disease: Secondary | ICD-10-CM | POA: Diagnosis not present

## 2016-02-17 DIAGNOSIS — E211 Secondary hyperparathyroidism, not elsewhere classified: Secondary | ICD-10-CM | POA: Diagnosis not present

## 2016-02-21 ENCOUNTER — Ambulatory Visit (INDEPENDENT_AMBULATORY_CARE_PROVIDER_SITE_OTHER): Payer: Medicare PPO

## 2016-02-21 DIAGNOSIS — I82409 Acute embolism and thrombosis of unspecified deep veins of unspecified lower extremity: Secondary | ICD-10-CM

## 2016-02-21 LAB — POCT INR
INR: 1.5
PT: 18.1

## 2016-02-21 NOTE — Patient Instructions (Addendum)
Anticoagulation Dose Instructions as of 02/21/2016      Lori Johnson Tue Wed Thu Fri Sat   New Dose 5 mg 7.5 mg 5 mg 7.5 mg 5 mg 7.5 mg 5 mg    Description        5 mg daily except 7.5 mg M W F. follow up in 2 weeks. The patient has been having nosebleeds.  [name] checked her nose and noted a spot in each nostril.  Her blood pressure today was 160/80.  She states she has been eating greens 2-3 times this week and admits she adds salt to her food.  She has been instructed to decrease her salt intake, eat moderate amount of greens and he will see her next week.

## 2016-02-28 ENCOUNTER — Encounter: Payer: Self-pay | Admitting: Family Medicine

## 2016-02-28 ENCOUNTER — Ambulatory Visit (INDEPENDENT_AMBULATORY_CARE_PROVIDER_SITE_OTHER): Payer: Medicare PPO | Admitting: Family Medicine

## 2016-02-28 VITALS — BP 154/52 | HR 64 | Temp 98.2°F | Resp 14 | Wt 187.0 lb

## 2016-02-28 DIAGNOSIS — N183 Chronic kidney disease, stage 3 unspecified: Secondary | ICD-10-CM

## 2016-02-28 DIAGNOSIS — I82409 Acute embolism and thrombosis of unspecified deep veins of unspecified lower extremity: Secondary | ICD-10-CM

## 2016-02-28 DIAGNOSIS — N184 Chronic kidney disease, stage 4 (severe): Secondary | ICD-10-CM | POA: Diagnosis not present

## 2016-02-28 DIAGNOSIS — Z86718 Personal history of other venous thrombosis and embolism: Secondary | ICD-10-CM

## 2016-02-28 DIAGNOSIS — Z95828 Presence of other vascular implants and grafts: Secondary | ICD-10-CM | POA: Diagnosis not present

## 2016-02-28 DIAGNOSIS — I129 Hypertensive chronic kidney disease with stage 1 through stage 4 chronic kidney disease, or unspecified chronic kidney disease: Secondary | ICD-10-CM | POA: Diagnosis not present

## 2016-02-28 DIAGNOSIS — I10 Essential (primary) hypertension: Secondary | ICD-10-CM

## 2016-02-28 LAB — POCT INR
INR: 2.8
PT: 33.7

## 2016-02-28 NOTE — Patient Instructions (Signed)

## 2016-02-28 NOTE — Progress Notes (Signed)
Patient ID: Lori Johnson, female   DOB: January 27, 1930, 82 y.o.   MRN: [id number]       Patient: Lori Johnson Female    DOB: 10-06-1930   82 y.o.   MRN: [id number] Visit Date: 02/28/2016  Today's Provider: [name], PA   Chief Complaint  Patient presents with  . Anticoagulation  . Epistaxis   Subjective:    HPI  Patient is here to follow up on her INR level and nose bleeds. Her last INR was on March 24th. Since then she had 1 episode of nose bleed that lasted a few minutes but not long. She is still taking Cephalexin. She is taking Coumadin alternating 5 mg and 7.5 mg.   Past Medical History  Diagnosis Date  . Renal disorder   . Hypertension   . Macular degeneration   . Chronic infection June 2014    Has chronic infection of right femoral graft with history of extensive debridement in June 2014. Was on oral Clindamycin for 3 weeks for culture positive for anerobes and staph lugdunensis. Vascular Surgeon at [facility] Remonia Richter, MD) recommended indefinite suppressive therapy with Cephalexin 500 mg qd and reassessment every 3 months. Will refill Cephalexin.)  . Hyperlipidemia   . Hyperuricemia   . ASCVD (arteriosclerotic cardiovascular disease)     with stent RCA  . Angiopathy, peripheral ([place])   . PVD (peripheral vascular disease) ([place])   . Cervical lymphadenitis   . GERD (gastroesophageal reflux disease)   . Hiatal hernia   . OA (osteoarthritis)   . Chronic gout of left ankle due to renal impairment without tophus   . Bilateral bunions   . CRF (chronic renal failure)   . Chronic kidney disease   . Mass of right side of neck   . Nocturia   . History of DVT (deep vein thrombosis)   . History of arterial bypass of lower extremity    Past Surgical History  Procedure Laterality Date  . Arterial bypass right leg Right   . Cholecystectomy    . Broken left foot Left 01/2002  . Stent in leg    . Abdominal hysterectomy  1981   Family History  Problem Relation Age of Onset  .  Heart disease Mother   . Heart disease Father    Allergies  Allergen Reactions  . Pletal [Cilostazol] Swelling  . Erythromycin Hives  . Codeine Nausea Only and Nausea And Vomiting  . Erythromycin Base Itching and Rash  . Penicillins Hives, Itching and Rash  . Sulfa Antibiotics Rash   Previous Medications   ACETAMINOPHEN (TYLENOL ARTHRITIS PAIN PO)    Take by mouth.   ALBUTEROL (PROAIR HFA) 108 (90 BASE) MCG/ACT INHALER    Inhale into the lungs.   ALBUTEROL (PROVENTIL HFA;VENTOLIN HFA) 108 (90 BASE) MCG/ACT INHALER    Inhale into the lungs.   AMLODIPINE (NORVASC) 10 MG TABLET    TAKE 1 TABLET EVERY DAY   CALCIUM CARB-CHOLECALCIFEROL (CALCIUM + D3 PO)    Take by mouth.   CEPHALEXIN (KEFLEX) 500 MG CAPSULE       FUROSEMIDE (LASIX) 40 MG TABLET    Take by mouth.   LANSOPRAZOLE (PREVACID) 30 MG CAPSULE    Take 30 mg by mouth.   LORATADINE (CLARITIN) 10 MG TABLET    Take 10 mg by mouth. Reported on 12/30/2015   LOSARTAN-HYDROCHLOROTHIAZIDE (HYZAAR) 50-12.5 MG TABLET    TAKE 1 TABLET EVERY DAY   MOMETASONE-FORMOTEROL (DULERA) 100-5 MCG/ACT AERO    Inhale into  the lungs.   MULTIPLE VITAMIN (MULTIVITAMIN) CAPSULE    Take by mouth. Reported on 12/30/2015   NITROGLYCERIN (NITROSTAT) 0.4 MG SL TABLET    Place 0.4 mg under the tongue.   PRAVASTATIN (PRAVACHOL) 20 MG TABLET    Take by mouth.   WARFARIN (COUMADIN) 5 MG TABLET    Take by mouth.    Review of Systems  Constitutional: Negative.   Respiratory: Negative.   Cardiovascular: Negative.   Skin: Negative.   Hematological: Bruises/bleeds easily.    Social History  Substance Use Topics  . Smoking status: Former Research scientist (life sciences)  . Smokeless tobacco: Never Used  . Alcohol Use: No   Objective:   BP 154/52 mmHg  Pulse 64  Temp(Src) 98.2 F (36.8 C)  Resp 14  Wt 187 lb (84.823 kg)  Physical Exam  Constitutional: She is oriented to person, place, and time. She appears well-developed and well-nourished. No distress.  HENT:  Head:  Normocephalic and atraumatic.  Right Ear: Hearing and external ear normal.  Left Ear: Hearing and external ear normal.  Nose: Nose normal.  Mild hearing deficit. No active nosebleeds. Slight raw spot anterior nasal septum in the right nostril.  Eyes: Conjunctivae, EOM and lids are normal. Right eye exhibits no discharge. Left eye exhibits no discharge. No scleral icterus.  Neck: Neck supple. No thyromegaly present.  Cardiovascular: Normal rate and regular rhythm.   Pulmonary/Chest: Effort normal and breath sounds normal. No respiratory distress.  Abdominal: Soft. Bowel sounds are normal.  Musculoskeletal: Normal range of motion.  Neurological: She is alert and oriented to person, place, and time.  Skin: Skin is intact. No lesion and no rash noted.  Psychiatric: She has a normal mood and affect. Her speech is normal and behavior is normal. Thought content normal.      Assessment & Plan:     1. Deep vein thrombosis (DVT), unspecified laterality Has been on Coumadin 5mg  qd except 7.5 mg M,W,F. PT INR 2.8 today (goal 2.0-3.0). Will drop back to 5 mg qd except 7.5 mg M,F. Recheck level in 1 month. Only had one slight bleed from the right nostril since last office visit. May use saline spray and advised she may need INT referral if it continues. No extremity swelling or discomfort recently. - POCT INR  2. H/O arterial bypass of lower limb Good pulses in feet and lower legs bilaterally today. Continue routine follow up with vascular surgeon as planned.  3. H/O deep venous thrombosis  4. Chronic kidney disease, stage 3 (moderate) Has an appointment with Dr. Candiss Norse today at [place] to go over labs. States last GFR improved from 12 to 21.  5. Benign hypertension Better reading today. Continues Hyzaar 50-12.5 mg qd, Amlodipine 10 mg qd and Lasix 40 mg prn edema. Will be getting labs routinely through nephrology office. Recheck pending reports today.       [name], PA  [facility]

## 2016-03-02 ENCOUNTER — Other Ambulatory Visit: Payer: Self-pay | Admitting: Family Medicine

## 2016-03-27 ENCOUNTER — Ambulatory Visit (INDEPENDENT_AMBULATORY_CARE_PROVIDER_SITE_OTHER): Payer: Medicare PPO

## 2016-03-27 DIAGNOSIS — I82409 Acute embolism and thrombosis of unspecified deep veins of unspecified lower extremity: Secondary | ICD-10-CM

## 2016-03-27 LAB — POCT INR
INR: 4.3
PT: 51.3

## 2016-03-27 NOTE — Patient Instructions (Signed)
Anticoagulation Dose Instructions as of 03/27/2016      Dorene Grebe Tue Wed Thu Fri Sat   New Dose 5 mg 5 mg 5 mg 5 mg 5 mg 5 mg 5 mg    Description        5 mg daily, F/U 2 weeks

## 2016-04-03 ENCOUNTER — Other Ambulatory Visit: Payer: Self-pay | Admitting: Family Medicine

## 2016-04-07 DIAGNOSIS — Z87891 Personal history of nicotine dependence: Secondary | ICD-10-CM | POA: Diagnosis not present

## 2016-04-07 DIAGNOSIS — I10 Essential (primary) hypertension: Secondary | ICD-10-CM | POA: Diagnosis not present

## 2016-04-07 DIAGNOSIS — I2510 Atherosclerotic heart disease of native coronary artery without angina pectoris: Secondary | ICD-10-CM | POA: Diagnosis not present

## 2016-04-07 DIAGNOSIS — Z95828 Presence of other vascular implants and grafts: Secondary | ICD-10-CM | POA: Diagnosis not present

## 2016-04-07 DIAGNOSIS — I739 Peripheral vascular disease, unspecified: Secondary | ICD-10-CM | POA: Diagnosis not present

## 2016-04-07 DIAGNOSIS — I771 Stricture of artery: Secondary | ICD-10-CM | POA: Diagnosis not present

## 2016-04-10 ENCOUNTER — Ambulatory Visit (INDEPENDENT_AMBULATORY_CARE_PROVIDER_SITE_OTHER): Payer: Medicare PPO

## 2016-04-10 DIAGNOSIS — I82409 Acute embolism and thrombosis of unspecified deep veins of unspecified lower extremity: Secondary | ICD-10-CM

## 2016-04-10 LAB — POCT INR
INR: 2.9
PT: 35.2

## 2016-04-10 NOTE — Patient Instructions (Signed)
Anticoagulation Dose Instructions as of 04/10/2016      Dorene Grebe Tue Wed Thu Fri Sat   New Dose 5 mg 5 mg 5 mg 5 mg 5 mg 5 mg 5 mg    Description        5 mg daily, F/U 4 weeks

## 2016-05-08 ENCOUNTER — Ambulatory Visit: Payer: Self-pay

## 2016-05-15 ENCOUNTER — Ambulatory Visit: Payer: Medicare PPO

## 2016-05-15 ENCOUNTER — Ambulatory Visit (INDEPENDENT_AMBULATORY_CARE_PROVIDER_SITE_OTHER): Payer: Medicare PPO | Admitting: Family Medicine

## 2016-05-15 ENCOUNTER — Encounter: Payer: Self-pay | Admitting: Family Medicine

## 2016-05-15 VITALS — BP 134/58 | HR 60 | Temp 98.6°F | Resp 16

## 2016-05-15 DIAGNOSIS — M79671 Pain in right foot: Secondary | ICD-10-CM | POA: Diagnosis not present

## 2016-05-15 DIAGNOSIS — Z86718 Personal history of other venous thrombosis and embolism: Secondary | ICD-10-CM | POA: Diagnosis not present

## 2016-05-15 DIAGNOSIS — N183 Chronic kidney disease, stage 3 unspecified: Secondary | ICD-10-CM

## 2016-05-15 DIAGNOSIS — I739 Peripheral vascular disease, unspecified: Secondary | ICD-10-CM | POA: Diagnosis not present

## 2016-05-15 LAB — POCT INR
INR: 4.9
PT: 59.4

## 2016-05-15 NOTE — Addendum Note (Signed)
Addended by: Wilburt Finlay L on: 05/15/2016 11:35 AM   Modules accepted: Orders

## 2016-05-15 NOTE — Patient Instructions (Signed)
Anticoagulation Dose Instructions as of 05/15/2016      Lori Johnson Tue Wed Thu Fri Sat   New Dose 5 mg 5 mg 5 mg 5 mg 5 mg 5 mg 5 mg    Description        Hold for 2 days, and continue 5 mg daily, F/U 1 week

## 2016-05-15 NOTE — Progress Notes (Signed)
Patient ID: Lori Johnson, female   DOB: [birth year], 82 y.o.   MRN: [id number]       Patient: Lori Johnson Female    DOB: May 09, 1930   82 y.o.   MRN: [id number] Visit Date: 05/15/2016  Today's Provider: [name], PA   Chief Complaint  Patient presents with  . Foot Swelling   Subjective:    HPI Patient comes in today c/o swelling in her right foot. Patient reports it is tender and has some redness. Patient reports that she has had symptoms for about 8 days. Patient feels as her symptoms could be gout related. She also mentions that she had an ultrasound about 1 month ago, and reports that it was within normal limits (done by her vascular surgeon at [facility])..    Past Medical History  Diagnosis Date  . Renal disorder   . Hypertension   . Macular degeneration   . Chronic infection June 2014    Has chronic infection of right femoral graft with history of extensive debridement in June 2014. Was on oral Clindamycin for 3 weeks for culture positive for anerobes and staph lugdunensis. Vascular Surgeon at [facility] Remonia Richter, MD) recommended indefinite suppressive therapy with Cephalexin 500 mg qd and reassessment every 3 months. Will refill Cephalexin.)  . Hyperlipidemia   . Hyperuricemia   . ASCVD (arteriosclerotic cardiovascular disease)     with stent RCA  . Angiopathy, peripheral ([place])   . PVD (peripheral vascular disease) ([place])   . Cervical lymphadenitis   . GERD (gastroesophageal reflux disease)   . Hiatal hernia   . OA (osteoarthritis)   . Chronic gout of left ankle due to renal impairment without tophus   . Bilateral bunions   . CRF (chronic renal failure)   . Chronic kidney disease   . Mass of right side of neck   . Nocturia   . History of DVT (deep vein thrombosis)   . History of arterial bypass of lower extremity    Past Surgical History  Procedure Laterality Date  . Arterial bypass right leg Right   . Cholecystectomy    . Broken left foot Left 01/2002  . Stent in leg      . Abdominal hysterectomy  1981   Family History  Problem Relation Age of Onset  . Heart disease Mother   . Heart disease Father    Allergies  Allergen Reactions  . Pletal [Cilostazol] Swelling  . Erythromycin Hives  . Codeine Nausea Only and Nausea And Vomiting  . Erythromycin Base Itching and Rash  . Penicillins Hives, Itching and Rash  . Sulfa Antibiotics Rash   No outpatient prescriptions have been marked as taking for the 05/15/16 encounter (Office Visit) with Vickki Muff Chrismon, PA.    Review of Systems  Constitutional: Negative.   Musculoskeletal: Positive for myalgias, back pain, joint swelling, arthralgias and gait problem. Negative for neck pain and neck stiffness.  Skin: Positive for color change. Negative for pallor, rash and wound.    Social History  Substance Use Topics  . Smoking status: Former Research scientist (life sciences)  . Smokeless tobacco: Never Used  . Alcohol Use: No   Objective:   BP 134/58 mmHg  Pulse 60  Temp(Src) 98.6 F (37 C)  Resp 16  Wt   Physical Exam  Constitutional: She appears well-developed and well-nourished.  HENT:  Head: Normocephalic.  Right Ear: External ear normal.  Left Ear: External ear normal.  Nose: Nose normal.  Mouth/Throat: Oropharynx is clear and moist.  Eyes:  Conjunctivae are normal.  Slightly pale membranes.  Neck: Neck supple.  Cardiovascular: Normal rate and regular rhythm.   Pulmonary/Chest: Effort normal and breath sounds normal.  Abdominal: Bowel sounds are normal.  Musculoskeletal:  Slight redness and pitting edema of the right forefoot. Large bunion on the first MCP joint with soreness. Good pulses. Slightly erythematous lower right lower leg above ankle.      Assessment & Plan:     1. Right foot pain Onset over the past 8 days. No known injury. Had some right knee pain with it initially. Knee is better and foot starting to improve. Has had some gout in the past and feels this is a recurrence. Not much pain today but still  some redness. Will not start NSAID due to chronic kidney disease. May need prednisone if no sign of infection on blood counts. May continue Tylenol prn. Elevate foot as often as possible. Recheck pending lab reports. - CBC with Differential/Platelet - Uric acid  2. Angiopathy, peripheral ([place]) History of vascular stents in the right lower leg. Was followed by vascular surgeon one month ago and no sign of new clots. Stent was patent and advised to follow up in a year.  3. H/O deep venous thrombosis Still on Coumadin 5 mg qd. INR 4.9 today (goal 2.5-3.5 with vascular stent). Hold Coumadin for 2 days and given list of Vitamin K rich foods to stabilize INR. Recheck in 1 week.  4. Chronic kidney disease, stage 3 Lab Results  Component Value Date   CREATININE 3.38* 12/06/2015   CREATININE 2.39* 03/31/2015   CREATININE 1.98* 12/06/2014   Followed by Dr. Candiss Norse (nephrologist). Will get follow up labs before treating suspected gout.  - Comprehensive metabolic panel       [name], PA  [facility]

## 2016-05-16 LAB — COMPREHENSIVE METABOLIC PANEL
ALBUMIN: 4.2 g/dL (ref 3.5–4.7)
ALT: 10 IU/L (ref 0–32)
AST: 20 IU/L (ref 0–40)
Albumin/Globulin Ratio: 1.5 (ref 1.2–2.2)
Alkaline Phosphatase: 81 IU/L (ref 39–117)
BUN / CREAT RATIO: 18 (ref 12–28)
BUN: 38 mg/dL — AB (ref 8–27)
Bilirubin Total: 0.5 mg/dL (ref 0.0–1.2)
CO2: 17 mmol/L — AB (ref 18–29)
CREATININE: 2.1 mg/dL — AB (ref 0.57–1.00)
Calcium: 9.7 mg/dL (ref 8.7–10.3)
Chloride: 97 mmol/L (ref 96–106)
GFR, EST AFRICAN AMERICAN: 24 mL/min/{1.73_m2} — AB (ref >59)
GFR, EST NON AFRICAN AMERICAN: 21 mL/min/{1.73_m2} — AB (ref >59)
GLOBULIN, TOTAL: 2.8 g/dL (ref 1.5–4.5)
GLUCOSE: 103 mg/dL — AB (ref 65–99)
Potassium: 4.3 mmol/L (ref 3.5–5.2)
Sodium: 136 mmol/L (ref 134–144)
TOTAL PROTEIN: 7 g/dL (ref 6.0–8.5)

## 2016-05-16 LAB — CBC WITH DIFFERENTIAL/PLATELET
BASOS: 0 %
Basophils Absolute: 0 10*3/uL (ref 0.0–0.2)
EOS (ABSOLUTE): 0.2 10*3/uL (ref 0.0–0.4)
EOS: 4 %
HEMATOCRIT: 31.9 % — AB (ref 34.0–46.6)
HEMOGLOBIN: 10.8 g/dL — AB (ref 11.1–15.9)
Immature Grans (Abs): 0 10*3/uL (ref 0.0–0.1)
Immature Granulocytes: 0 %
LYMPHS ABS: 2.4 10*3/uL (ref 0.7–3.1)
Lymphs: 35 %
MCH: 30.3 pg (ref 26.6–33.0)
MCHC: 33.9 g/dL (ref 31.5–35.7)
MCV: 90 fL (ref 79–97)
MONOCYTES: 10 %
Monocytes Absolute: 0.7 10*3/uL (ref 0.1–0.9)
NEUTROS ABS: 3.3 10*3/uL (ref 1.4–7.0)
Neutrophils: 51 %
Platelets: 262 10*3/uL (ref 150–379)
RBC: 3.56 x10E6/uL — AB (ref 3.77–5.28)
RDW: 13.9 % (ref 12.3–15.4)
WBC: 6.6 10*3/uL (ref 3.4–10.8)

## 2016-05-22 ENCOUNTER — Ambulatory Visit (INDEPENDENT_AMBULATORY_CARE_PROVIDER_SITE_OTHER): Payer: Medicare PPO | Admitting: Family Medicine

## 2016-05-22 ENCOUNTER — Encounter: Payer: Self-pay | Admitting: Family Medicine

## 2016-05-22 DIAGNOSIS — Z86718 Personal history of other venous thrombosis and embolism: Secondary | ICD-10-CM

## 2016-05-22 LAB — POCT INR
INR: 2.9
PT: 34.8

## 2016-05-22 NOTE — Progress Notes (Signed)
Patient ID: Lori Johnson, female   DOB: 27-Nov-1930, 82 y.o.   MRN: [id number]   Patient comes in today for a recheck on PT/INR only. Patient reports that she has been taking Coumadin 5mg  daily. She denies any abnormal bleeding. She does have a spinach salad at least 2 times a week.  INR 2.9 today. Will continue Coumadin 5 mg qd and recheck in a month.

## 2016-05-22 NOTE — Patient Instructions (Signed)
Anticoagulation Dose Instructions as of 05/22/2016      Lori Johnson Tue Wed Thu Fri Sat   New Dose 5 mg 5 mg 5 mg 5 mg 5 mg 5 mg 5 mg    Description        Continue 5mg  daily, and follow up in 4 weeks.

## 2016-05-23 LAB — URIC ACID: URIC ACID: 9.7 mg/dL — AB (ref 2.5–7.1)

## 2016-05-23 LAB — SPECIMEN STATUS REPORT

## 2016-05-25 ENCOUNTER — Telehealth: Payer: Self-pay

## 2016-05-25 NOTE — Telephone Encounter (Signed)
Low dose of Uloric should be OK as long as we continue to wa

## 2016-05-25 NOTE — Telephone Encounter (Signed)
Patient reports that the swelling and pain has decreased, and she is feeling much better. She reports that she will mention this to Dr. Candiss Norse next month at her FU appt.

## 2016-05-25 NOTE — Telephone Encounter (Signed)
Advised patient as below. Patient reports that you have tried her on a gout medication before in the past, and it messed up her kidney function. Patient reports that she only has 1 kidney. Patient wants to be sure that this medication will not do the same. Please advise. Thanks!

## 2016-05-25 NOTE — Telephone Encounter (Signed)
-----   Message from Margo Common, [place] sent at 05/24/2016 11:59 PM EDT ----- Uric acid level high. Need Uloric 40 mg qd #30 &3 RF. With the addition of this medication for gout, will need to recheck protime in 2 weeks and recheck uric acid level in a month. Need to get a list of foods to avoid with gout.

## 2016-05-25 NOTE — Telephone Encounter (Signed)
(  Previous message is an entry error). Her creatinine is higher than previous readings. If still having pain in the foot, would suggest getting nephrology evaluation and recommendation for gout treatment.

## 2016-05-30 ENCOUNTER — Other Ambulatory Visit: Payer: Self-pay | Admitting: Family Medicine

## 2016-06-01 NOTE — Telephone Encounter (Signed)
I am not sure what this refill is about-Do you?

## 2016-06-19 ENCOUNTER — Ambulatory Visit (INDEPENDENT_AMBULATORY_CARE_PROVIDER_SITE_OTHER): Payer: Medicare PPO | Admitting: Emergency Medicine

## 2016-06-19 DIAGNOSIS — I824Y9 Acute embolism and thrombosis of unspecified deep veins of unspecified proximal lower extremity: Secondary | ICD-10-CM

## 2016-06-19 LAB — POCT INR
INR: 3.4
PT: 40.9

## 2016-06-19 NOTE — Patient Instructions (Signed)
Anticoagulation Dose Instructions as of 06/19/2016      Dorene Grebe Tue Wed Thu Fri Sat   New Dose 5 mg 5 mg 5 mg 5 mg 5 mg 5 mg 5 mg    Description        Continue 5mg  daily, increase Vitamin K in diet (dark leafy veggies), recheck in 1 month

## 2016-06-29 DIAGNOSIS — E872 Acidosis: Secondary | ICD-10-CM | POA: Diagnosis not present

## 2016-06-29 DIAGNOSIS — R609 Edema, unspecified: Secondary | ICD-10-CM | POA: Diagnosis not present

## 2016-06-29 DIAGNOSIS — I129 Hypertensive chronic kidney disease with stage 1 through stage 4 chronic kidney disease, or unspecified chronic kidney disease: Secondary | ICD-10-CM | POA: Diagnosis not present

## 2016-06-29 DIAGNOSIS — E211 Secondary hyperparathyroidism, not elsewhere classified: Secondary | ICD-10-CM | POA: Diagnosis not present

## 2016-06-29 DIAGNOSIS — I10 Essential (primary) hypertension: Secondary | ICD-10-CM | POA: Diagnosis not present

## 2016-06-29 DIAGNOSIS — N184 Chronic kidney disease, stage 4 (severe): Secondary | ICD-10-CM | POA: Diagnosis not present

## 2016-07-08 DIAGNOSIS — R600 Localized edema: Secondary | ICD-10-CM | POA: Diagnosis not present

## 2016-07-08 DIAGNOSIS — I10 Essential (primary) hypertension: Secondary | ICD-10-CM | POA: Diagnosis not present

## 2016-07-08 DIAGNOSIS — N184 Chronic kidney disease, stage 4 (severe): Secondary | ICD-10-CM | POA: Diagnosis not present

## 2016-07-17 ENCOUNTER — Ambulatory Visit (INDEPENDENT_AMBULATORY_CARE_PROVIDER_SITE_OTHER): Payer: Medicare PPO

## 2016-07-17 DIAGNOSIS — Z86718 Personal history of other venous thrombosis and embolism: Secondary | ICD-10-CM

## 2016-07-17 LAB — POCT INR
INR: 4.6
PT: 54.8

## 2016-07-17 NOTE — Patient Instructions (Signed)
Anticoagulation Dose Instructions as of 07/17/2016      Lori Johnson Tue Wed Thu Fri Sat   New Dose 5 mg 5 mg 5 mg 5 mg 5 mg 5 mg 5 mg    Description   Hold dose for 3 days. Continue 5mg  daily. Recheck in 1 week.

## 2016-07-24 ENCOUNTER — Ambulatory Visit (INDEPENDENT_AMBULATORY_CARE_PROVIDER_SITE_OTHER): Payer: Medicare PPO

## 2016-07-24 DIAGNOSIS — Z86718 Personal history of other venous thrombosis and embolism: Secondary | ICD-10-CM

## 2016-07-24 LAB — POCT INR
INR: 1.8
PT: 21.1

## 2016-07-24 NOTE — Patient Instructions (Signed)
Anticoagulation Dose Instructions as of 07/24/2016      Lori Johnson Tue Wed Thu Fri Sat   New Dose 5 mg 7.5 mg 5 mg 5 mg 5 mg 7.5 mg 5 mg    Description   Take 5 mg daily except 7.5 mg on Mondays and Fridays and re check in 3 weeks.

## 2016-08-14 ENCOUNTER — Ambulatory Visit (INDEPENDENT_AMBULATORY_CARE_PROVIDER_SITE_OTHER): Payer: Medicare PPO

## 2016-08-14 DIAGNOSIS — Z23 Encounter for immunization: Secondary | ICD-10-CM | POA: Diagnosis not present

## 2016-08-14 DIAGNOSIS — Z86718 Personal history of other venous thrombosis and embolism: Secondary | ICD-10-CM

## 2016-08-14 LAB — POCT INR
INR: 2.6
PT: 30.6

## 2016-08-14 NOTE — Patient Instructions (Signed)
Anticoagulation Dose Instructions as of 08/14/2016      Lori Johnson Tue Wed Thu Fri Sat   New Dose 5 mg 7.5 mg 5 mg 5 mg 5 mg 7.5 mg 5 mg    Description   Take 5 mg daily except 7.5 mg on Mondays and Fridays and re check in 4 weeks.

## 2016-08-26 DIAGNOSIS — Z08 Encounter for follow-up examination after completed treatment for malignant neoplasm: Secondary | ICD-10-CM | POA: Diagnosis not present

## 2016-08-26 DIAGNOSIS — C44722 Squamous cell carcinoma of skin of right lower limb, including hip: Secondary | ICD-10-CM | POA: Diagnosis not present

## 2016-08-26 DIAGNOSIS — L239 Allergic contact dermatitis, unspecified cause: Secondary | ICD-10-CM | POA: Diagnosis not present

## 2016-08-26 DIAGNOSIS — L570 Actinic keratosis: Secondary | ICD-10-CM | POA: Diagnosis not present

## 2016-08-26 DIAGNOSIS — D044 Carcinoma in situ of skin of scalp and neck: Secondary | ICD-10-CM | POA: Diagnosis not present

## 2016-08-26 DIAGNOSIS — D485 Neoplasm of uncertain behavior of skin: Secondary | ICD-10-CM | POA: Diagnosis not present

## 2016-08-26 DIAGNOSIS — Z85828 Personal history of other malignant neoplasm of skin: Secondary | ICD-10-CM | POA: Diagnosis not present

## 2016-09-11 ENCOUNTER — Ambulatory Visit (INDEPENDENT_AMBULATORY_CARE_PROVIDER_SITE_OTHER): Payer: Medicare PPO

## 2016-09-11 DIAGNOSIS — Z86718 Personal history of other venous thrombosis and embolism: Secondary | ICD-10-CM

## 2016-09-11 LAB — POCT INR
INR: 2.7
PT: 32.7

## 2016-09-11 NOTE — Patient Instructions (Signed)
Anticoagulation Dose Instructions as of 09/11/2016      Lori Johnson Tue Wed Thu Fri Sat   New Dose 5 mg 5 mg 5 mg 5 mg 5 mg 5 mg 5 mg    Description   Take 5 mg daily, and recheck 2 weeks

## 2016-09-22 DIAGNOSIS — D044 Carcinoma in situ of skin of scalp and neck: Secondary | ICD-10-CM | POA: Diagnosis not present

## 2016-09-25 ENCOUNTER — Ambulatory Visit (INDEPENDENT_AMBULATORY_CARE_PROVIDER_SITE_OTHER): Payer: Medicare PPO

## 2016-09-25 DIAGNOSIS — Z86718 Personal history of other venous thrombosis and embolism: Secondary | ICD-10-CM | POA: Diagnosis not present

## 2016-09-25 LAB — POCT INR
INR: 3.4
PT: 41.3

## 2016-09-25 NOTE — Patient Instructions (Signed)
Anticoagulation Dose Instructions as of 09/25/2016      Lori Johnson Tue Wed Thu Fri Sat   New Dose 5 mg 5 mg 5 mg 5 mg 5 mg 5 mg 5 mg    Description   Hold 1 day and then resume taking 5 mg daily, and recheck 2 weeks

## 2016-10-02 DIAGNOSIS — C44722 Squamous cell carcinoma of skin of right lower limb, including hip: Secondary | ICD-10-CM | POA: Diagnosis not present

## 2016-10-09 ENCOUNTER — Ambulatory Visit (INDEPENDENT_AMBULATORY_CARE_PROVIDER_SITE_OTHER): Payer: Medicare PPO

## 2016-10-09 DIAGNOSIS — Z86718 Personal history of other venous thrombosis and embolism: Secondary | ICD-10-CM | POA: Diagnosis not present

## 2016-10-09 LAB — POCT INR
INR: 3.6
PT: 42.6

## 2016-10-09 NOTE — Patient Instructions (Signed)
Anticoagulation Dose Instructions as of 10/09/2016      Dorene Grebe Tue Wed Thu Fri Sat   New Dose 5 mg 2.5 mg 5 mg 5 mg 2.5 mg 5 mg 5 mg    Description   Take 5 mg daily except on Monday and Thursday take 2.5 mg and recheck 2 weeks

## 2016-10-15 ENCOUNTER — Ambulatory Visit: Payer: Medicare PPO | Admitting: Family Medicine

## 2016-10-23 ENCOUNTER — Ambulatory Visit: Payer: Self-pay

## 2016-10-23 ENCOUNTER — Ambulatory Visit (INDEPENDENT_AMBULATORY_CARE_PROVIDER_SITE_OTHER): Payer: Medicare PPO | Admitting: Family Medicine

## 2016-10-23 ENCOUNTER — Ambulatory Visit
Admission: RE | Admit: 2016-10-23 | Discharge: 2016-10-23 | Disposition: A | Discharge status: Home / Self Care | Payer: Medicare PPO | Source: Ambulatory Visit | Attending: Family Medicine | Admitting: Family Medicine

## 2016-10-23 ENCOUNTER — Encounter: Payer: Self-pay | Admitting: Family Medicine

## 2016-10-23 VITALS — BP 158/60 | HR 60 | Temp 97.8°F | Resp 16 | Wt 186.0 lb

## 2016-10-23 DIAGNOSIS — M19031 Primary osteoarthritis, right wrist: Secondary | ICD-10-CM | POA: Diagnosis not present

## 2016-10-23 DIAGNOSIS — I739 Peripheral vascular disease, unspecified: Secondary | ICD-10-CM | POA: Diagnosis not present

## 2016-10-23 DIAGNOSIS — M79644 Pain in right finger(s): Secondary | ICD-10-CM

## 2016-10-23 DIAGNOSIS — Z86718 Personal history of other venous thrombosis and embolism: Secondary | ICD-10-CM | POA: Diagnosis not present

## 2016-10-23 LAB — POCT INR
INR: 2.8
PT: 33

## 2016-10-23 MED ORDER — DICLOFENAC SODIUM 1 % TD GEL: 2.0000 g | Freq: Four times a day (QID) | TRANSDERMAL | 0 refills | Status: DC

## 2016-10-23 NOTE — Progress Notes (Signed)
Patient: Lori Johnson Female    DOB: 28-Jun-1930   82 y.o.   MRN: [id number] Visit Date: 10/23/2016  Today's Provider: [name], PA   Chief Complaint  Patient presents with  . Hand Pain  . DVT    PT check   Subjective:    HPI Pt is here for hand and thumb pain. She report that it started about a couple months ago or more. Located on the right hand. She reports that she has a hard time gripping things and she has to used the other hand to do most anything, so she has weakness in this hand. Also decreased ROM. She does not remember any injuries such as falls etc. She has been taking tylenol and topic creams for this.   She is also her for her PT/INR repeated. On 10/09/16 it was 3.6 she was told to take 5 mg daily except for Mondays and Thursdays. Today it is 2.8, within range.     Allergies  Allergen Reactions  . Pletal [Cilostazol] Swelling  . Erythromycin Hives  . Codeine Nausea Only and Nausea And Vomiting  . Erythromycin Base Itching and Rash  . Penicillins Hives, Itching and Rash  . Sulfa Antibiotics Rash     Current Outpatient Prescriptions:  .  Acetaminophen (TYLENOL ARTHRITIS PAIN PO), Take by mouth., Disp: , Rfl:  .  albuterol (PROAIR HFA) 108 (90 BASE) MCG/ACT inhaler, Inhale into the lungs., Disp: , Rfl:  .  albuterol (PROVENTIL HFA;VENTOLIN HFA) 108 (90 BASE) MCG/ACT inhaler, Inhale into the lungs., Disp: , Rfl:  .  amLODipine (NORVASC) 10 MG tablet, TAKE 1 TABLET EVERY DAY, Disp: 90 tablet, Rfl: 3 .  Calcium Carb-Cholecalciferol (CALCIUM + D3 PO), Take by mouth., Disp: , Rfl:  .  cephALEXin (KEFLEX) 500 MG capsule, TAKE 1 CAPSULE EVERY DAY, Disp: 90 capsule, Rfl: 1 .  furosemide (LASIX) 40 MG tablet, Take by mouth., Disp: , Rfl:  .  hydrocortisone valerate cream (WESTCORT) 0.2 %, , Disp: , Rfl:  .  lansoprazole (PREVACID) 30 MG capsule, Take 30 mg by mouth., Disp: , Rfl:  .  loratadine (CLARITIN) 10 MG tablet, Take 10 mg by mouth. Reported on  12/30/2015, Disp: , Rfl:  .  losartan-hydrochlorothiazide (HYZAAR) 50-12.5 MG tablet, TAKE 1 TABLET EVERY DAY, Disp: 90 tablet, Rfl: 3 .  mometasone-formoterol (DULERA) 100-5 MCG/ACT AERO, Inhale into the lungs., Disp: , Rfl:  .  Multiple Vitamin (MULTIVITAMIN) capsule, Take by mouth. Reported on 12/30/2015, Disp: , Rfl:  .  nitroGLYCERIN (NITROSTAT) 0.4 MG SL tablet, Place 0.4 mg under the tongue., Disp: , Rfl:  .  pravastatin (PRAVACHOL) 20 MG tablet, TAKE 1 TABLET AT BEDTIME, Disp: 90 tablet, Rfl: 3 .  warfarin (COUMADIN) 5 MG tablet, TAKE 1 TABLET EVERY DAY  EXCEPT TAKE 1 AND 1/2 TABLETS  ON  FRIDAYS, Disp: 90 tablet, Rfl: 4  Review of Systems  Constitutional: Negative.   HENT: Negative.   Eyes: Negative.   Respiratory: Negative.   Cardiovascular: Negative.   Gastrointestinal: Negative.   Endocrine: Negative.   Genitourinary: Negative.   Musculoskeletal: Positive for arthralgias and joint swelling.  Skin: Negative.   Allergic/Immunologic: Negative.   Neurological: Negative.   Hematological: Negative.   Psychiatric/Behavioral: Negative.     Social History  Substance Use Topics  . Smoking status: Former Research scientist (life sciences)  . Smokeless tobacco: Never Used  . Alcohol use No   Objective:   BP (!) 158/60 (BP Location: Left Arm, Patient Position:  Sitting, Cuff Size: Large)   Pulse 60   Temp 97.8 F (36.6 C) (Oral)   Resp 16   Wt 186 lb (84.4 kg)   BMI 31.93 kg/m   Physical Exam      Assessment & Plan:     1. Pain of right thumb Progressive pain and weakness of right hand/thumb grip over the past "few" months. Tender over the first MP and metacarpal-carpal joints of the right thumb. Some soreness into the forearm. Will get x-ray evaluation. Try to not use oral NSAID or oral prednisone due to anticoagulation therapy. Prescribed topical Voltaren Gel and thumb splint/spica. Recheck pending x-ray report. - diclofenac sodium (VOLTAREN) 1 % GEL; Apply 2 g topically 4 (four) times daily.   Dispense: 100 g; Refill: 0 - DG Wrist Complete Right  2. History of DVT (deep vein thrombosis) Good INR at 2.8 today. Continue present Coumadin 5 mg qd except 1/2 tablet M&Th. Recheck in 3 weeks. - POCT INR       [name], PA  [facility]

## 2016-10-26 ENCOUNTER — Telehealth: Payer: Self-pay

## 2016-10-26 NOTE — Telephone Encounter (Signed)
Patient and daughter has been advised, Lori Johnson

## 2016-10-26 NOTE — Telephone Encounter (Signed)
-----   Message from Margo Common, [place] sent at 10/26/2016  8:08 AM EST ----- X-ray confirms significant arthritis of the radial side of wrist and base of thumb. Continue Voltaren Gel and splint. If worsening over the next week, should recheck and consider referral to hand specialist or rheumatologist.

## 2016-10-29 DIAGNOSIS — E211 Secondary hyperparathyroidism, not elsewhere classified: Secondary | ICD-10-CM | POA: Diagnosis not present

## 2016-10-29 DIAGNOSIS — N184 Chronic kidney disease, stage 4 (severe): Secondary | ICD-10-CM | POA: Diagnosis not present

## 2016-10-29 DIAGNOSIS — I129 Hypertensive chronic kidney disease with stage 1 through stage 4 chronic kidney disease, or unspecified chronic kidney disease: Secondary | ICD-10-CM | POA: Diagnosis not present

## 2016-10-29 DIAGNOSIS — I10 Essential (primary) hypertension: Secondary | ICD-10-CM | POA: Diagnosis not present

## 2016-10-29 DIAGNOSIS — E872 Acidosis: Secondary | ICD-10-CM | POA: Diagnosis not present

## 2016-10-29 DIAGNOSIS — R609 Edema, unspecified: Secondary | ICD-10-CM | POA: Diagnosis not present

## 2016-11-02 DIAGNOSIS — R911 Solitary pulmonary nodule: Secondary | ICD-10-CM | POA: Diagnosis not present

## 2016-11-02 DIAGNOSIS — R0602 Shortness of breath: Secondary | ICD-10-CM | POA: Diagnosis not present

## 2016-11-02 DIAGNOSIS — K210 Gastro-esophageal reflux disease with esophagitis: Secondary | ICD-10-CM | POA: Diagnosis not present

## 2016-11-03 DIAGNOSIS — R609 Edema, unspecified: Secondary | ICD-10-CM | POA: Diagnosis not present

## 2016-11-03 DIAGNOSIS — I129 Hypertensive chronic kidney disease with stage 1 through stage 4 chronic kidney disease, or unspecified chronic kidney disease: Secondary | ICD-10-CM | POA: Diagnosis not present

## 2016-11-03 DIAGNOSIS — N184 Chronic kidney disease, stage 4 (severe): Secondary | ICD-10-CM | POA: Diagnosis not present

## 2016-11-04 ENCOUNTER — Encounter: Payer: Self-pay | Admitting: Family Medicine

## 2016-11-10 DIAGNOSIS — D044 Carcinoma in situ of skin of scalp and neck: Secondary | ICD-10-CM | POA: Diagnosis not present

## 2016-11-10 DIAGNOSIS — L570 Actinic keratosis: Secondary | ICD-10-CM | POA: Diagnosis not present

## 2016-11-10 DIAGNOSIS — D485 Neoplasm of uncertain behavior of skin: Secondary | ICD-10-CM | POA: Diagnosis not present

## 2016-11-13 ENCOUNTER — Encounter: Payer: Self-pay | Admitting: Family Medicine

## 2016-11-13 ENCOUNTER — Ambulatory Visit (INDEPENDENT_AMBULATORY_CARE_PROVIDER_SITE_OTHER): Payer: Medicare PPO | Admitting: Family Medicine

## 2016-11-13 VITALS — BP 128/60 | HR 64 | Temp 98.3°F | Resp 14 | Wt 183.2 lb

## 2016-11-13 DIAGNOSIS — M1811 Unilateral primary osteoarthritis of first carpometacarpal joint, right hand: Secondary | ICD-10-CM

## 2016-11-13 DIAGNOSIS — Z86718 Personal history of other venous thrombosis and embolism: Secondary | ICD-10-CM

## 2016-11-13 LAB — POCT INR
INR: 2.5
PT: 30.5

## 2016-11-13 NOTE — Progress Notes (Signed)
Patient: Lori Johnson Female    DOB: [birth year]   82 y.o.   MRN: [id number] Visit Date: 11/13/2016  Today's Provider: [name], PA   Chief Complaint  Patient presents with  . Hand Pain  . Follow-up   Subjective:    HPI Patient is here for a 3 week follow up on right thumb and hand pain. X-Ray confirmed arthritis. Patient was advised to continue Voltaren gel and splint. Patient reports good compliance with treatment plan. She states symptoms have improved.   Past Medical History:  Diagnosis Date  . Angiopathy, peripheral ([place])   . ASCVD (arteriosclerotic cardiovascular disease)    with stent RCA  . Bilateral bunions   . Cervical lymphadenitis   . Chronic gout of left ankle due to renal impairment without tophus   . Chronic infection June 2014   Has chronic infection of right femoral graft with history of extensive debridement in June 2014. Was on oral Clindamycin for 3 weeks for culture positive for anerobes and staph lugdunensis. Vascular Surgeon at [facility] Remonia Richter, MD) recommended indefinite suppressive therapy with Cephalexin 500 mg qd and reassessment every 3 months. Will refill Cephalexin.)  . Chronic kidney disease   . CRF (chronic renal failure)   . GERD (gastroesophageal reflux disease)   . Hiatal hernia   . History of arterial bypass of lower extremity   . History of DVT (deep vein thrombosis)   . Hyperlipidemia   . Hypertension   . Hyperuricemia   . Macular degeneration   . Mass of right side of neck   . Nocturia   . OA (osteoarthritis)   . PVD (peripheral vascular disease) ([place])   . Renal disorder    Past Surgical History:  Procedure Laterality Date  . ABDOMINAL HYSTERECTOMY  1981  . arterial bypass right leg Right   . broken left foot Left 01/2002  . CHOLECYSTECTOMY    . stent in leg     Family History  Problem Relation Age of Onset  . Heart disease Mother   . Heart disease Father    Allergies  Allergen Reactions  . Pletal [Cilostazol]  Swelling  . Erythromycin Hives  . Codeine Nausea Only and Nausea And Vomiting  . Erythromycin Base Itching and Rash  . Penicillins Hives, Itching and Rash  . Sulfa Antibiotics Rash     Previous Medications   ACETAMINOPHEN (TYLENOL ARTHRITIS PAIN PO)    Take by mouth.   ALBUTEROL (PROAIR HFA) 108 (90 BASE) MCG/ACT INHALER    Inhale into the lungs.   ALBUTEROL (PROVENTIL HFA;VENTOLIN HFA) 108 (90 BASE) MCG/ACT INHALER    Inhale into the lungs.   AMLODIPINE (NORVASC) 10 MG TABLET    TAKE 1 TABLET EVERY DAY   CALCIUM CARB-CHOLECALCIFEROL (CALCIUM + D3 PO)    Take by mouth.   CEPHALEXIN (KEFLEX) 500 MG CAPSULE    TAKE 1 CAPSULE EVERY DAY   DICLOFENAC SODIUM (VOLTAREN) 1 % GEL    Apply 2 g topically 4 (four) times daily.   FUROSEMIDE (LASIX) 40 MG TABLET    Take by mouth.   HYDROCORTISONE VALERATE CREAM (WESTCORT) 0.2 %       LANSOPRAZOLE (PREVACID) 30 MG CAPSULE    Take 30 mg by mouth.   LORATADINE (CLARITIN) 10 MG TABLET    Take 10 mg by mouth. Reported on 12/30/2015   LOSARTAN-HYDROCHLOROTHIAZIDE (HYZAAR) 50-12.5 MG TABLET    TAKE 1 TABLET EVERY DAY   MOMETASONE-FORMOTEROL (DULERA) 100-5 MCG/ACT AERO  Inhale into the lungs.   MULTIPLE VITAMIN (MULTIVITAMIN) CAPSULE    Take by mouth. Reported on 12/30/2015   NITROGLYCERIN (NITROSTAT) 0.4 MG SL TABLET    Place 0.4 mg under the tongue.   PRAVASTATIN (PRAVACHOL) 20 MG TABLET    TAKE 1 TABLET AT BEDTIME   WARFARIN (COUMADIN) 5 MG TABLET    TAKE 1 TABLET EVERY DAY  EXCEPT TAKE 1 AND 1/2 TABLETS  ON  FRIDAYS    Review of Systems  Social History  Substance Use Topics  . Smoking status: Former Research scientist (life sciences)  . Smokeless tobacco: Never Used  . Alcohol use No   Objective:   BP 128/60 (BP Location: Right Arm, Patient Position: Sitting, Cuff Size: Normal)   Pulse 64   Temp 98.3 F (36.8 C) (Oral)   Resp 14   Wt 183 lb 3.2 oz (83.1 kg)   BMI 31.45 kg/m   Physical Exam  Constitutional: She is oriented to person, place, and time. She appears  well-developed and well-nourished. No distress.  HENT:  Head: Normocephalic and atraumatic.  Right Ear: Hearing normal.  Left Ear: Hearing normal.  Nose: Nose normal.  Eyes: Conjunctivae and lids are normal. Right eye exhibits no discharge. Left eye exhibits no discharge. No scleral icterus.  Pulmonary/Chest: Effort normal. No respiratory distress.  Musculoskeletal:  Enlarged right first [facility] joint with decrease in discomfort. Slightly stiff ROM and good grip strength without pain today. Good pulses.   Neurological: She is alert and oriented to person, place, and time.  Skin: Skin is intact. No lesion and no rash noted.  Psychiatric: She has a normal mood and affect. Her speech is normal and behavior is normal. Thought content normal.      Assessment & Plan:      1. Arthritis of carpometacarpal (CMC) joint of right thumb Much improved after using Voltaren Gel. Able to cook and accomplish ADL's without pain now. May use Gel prn.  2. History of DVT (deep vein thrombosis) Stable on Coumadin 5 mg one tablets every day except 1/2 tablet Monday and Thursday. INR 2.5 today. Will continue same dose and recheck protime in a month. - POCT INR

## 2016-11-13 NOTE — Patient Instructions (Signed)
Anticoagulation Dose Instructions as of 11/13/2016      Dorene Grebe Tue Wed Thu Fri Sat   New Dose 5 mg 2.5 mg 5 mg 5 mg 2.5 mg 5 mg 5 mg    Description   Take 5 mg daily except on Monday and Thursday take 2.5 mg and recheck 4 weeks

## 2016-11-25 DIAGNOSIS — R0602 Shortness of breath: Secondary | ICD-10-CM | POA: Diagnosis not present

## 2016-11-26 ENCOUNTER — Encounter: Payer: Self-pay | Admitting: Family Medicine

## 2016-11-26 ENCOUNTER — Telehealth: Payer: Self-pay | Admitting: Family Medicine

## 2016-11-26 NOTE — Telephone Encounter (Signed)
Called Pt to schedule AWV with [facility] - knb °

## 2016-11-28 ENCOUNTER — Other Ambulatory Visit: Payer: Self-pay | Admitting: Family Medicine

## 2016-12-01 NOTE — Telephone Encounter (Signed)
Is prophylactic medication. LOV 11/13/2016. Renaldo Fiddler, CMA

## 2016-12-11 ENCOUNTER — Ambulatory Visit (INDEPENDENT_AMBULATORY_CARE_PROVIDER_SITE_OTHER): Payer: Medicare PPO | Admitting: Family Medicine

## 2016-12-11 DIAGNOSIS — Z86718 Personal history of other venous thrombosis and embolism: Secondary | ICD-10-CM | POA: Diagnosis not present

## 2016-12-11 LAB — POCT INR
INR: 2.1
PT: 25.2

## 2016-12-11 NOTE — Progress Notes (Signed)
   Patient: Lori Johnson Female    DOB: [birth year]   83 y.o.   MRN: [id number] Visit Date: 12/11/2016  Today's Provider: [name], PA   Protime/Coumadin Follow Up  Subjective:    HPI  Patient is here for INR. Last INR was 2.5. Last directions were to take 5 mg daily except 2.5 mg on Monday and Thursday.     Review of Systems   Objective:     Physical Exam      Assessment & Plan:     1. History of DVT (deep vein thrombosis) Still on Coumadin 5 mg qd except 2.5 mg on Monday and Thursday. PT 25.2 with INR 2.1 today. Will continue present dosage and restrict excessive vitamin K rich foods. Recheck at protime clinic in a month. - POCT INR

## 2016-12-11 NOTE — Patient Instructions (Signed)
Anticoagulation Dose Instructions as of 12/11/2016      Lori Johnson Tue Wed Thu Fri Sat   New Dose 5 mg 2.5 mg 5 mg 5 mg 2.5 mg 5 mg 5 mg    Description   Take 5 mg daily except on Monday and Thursday take 2.5 mg and recheck 4 weeks

## 2016-12-21 DIAGNOSIS — R0602 Shortness of breath: Secondary | ICD-10-CM | POA: Diagnosis not present

## 2016-12-21 DIAGNOSIS — K210 Gastro-esophageal reflux disease with esophagitis: Secondary | ICD-10-CM | POA: Diagnosis not present

## 2016-12-21 DIAGNOSIS — R911 Solitary pulmonary nodule: Secondary | ICD-10-CM | POA: Diagnosis not present

## 2017-01-08 ENCOUNTER — Ambulatory Visit (INDEPENDENT_AMBULATORY_CARE_PROVIDER_SITE_OTHER): Payer: Medicare PPO | Admitting: Family Medicine

## 2017-01-08 ENCOUNTER — Ambulatory Visit (INDEPENDENT_AMBULATORY_CARE_PROVIDER_SITE_OTHER): Payer: Medicare PPO

## 2017-01-08 VITALS — BP 148/52 | HR 64 | Temp 99.1°F | Ht 64.0 in | Wt 186.4 lb

## 2017-01-08 VITALS — BP 148/52 | HR 64 | Temp 99.1°F | Wt 186.4 lb

## 2017-01-08 DIAGNOSIS — M21619 Bunion of unspecified foot: Secondary | ICD-10-CM

## 2017-01-08 DIAGNOSIS — K449 Diaphragmatic hernia without obstruction or gangrene: Secondary | ICD-10-CM

## 2017-01-08 DIAGNOSIS — N183 Chronic kidney disease, stage 3 unspecified: Secondary | ICD-10-CM

## 2017-01-08 DIAGNOSIS — Z95828 Presence of other vascular implants and grafts: Secondary | ICD-10-CM

## 2017-01-08 DIAGNOSIS — Z23 Encounter for immunization: Secondary | ICD-10-CM | POA: Diagnosis not present

## 2017-01-08 DIAGNOSIS — Z86718 Personal history of other venous thrombosis and embolism: Secondary | ICD-10-CM | POA: Diagnosis not present

## 2017-01-08 DIAGNOSIS — I10 Essential (primary) hypertension: Secondary | ICD-10-CM | POA: Diagnosis not present

## 2017-01-08 DIAGNOSIS — Z0000 Encounter for general adult medical examination without abnormal findings: Secondary | ICD-10-CM | POA: Diagnosis not present

## 2017-01-08 DIAGNOSIS — K219 Gastro-esophageal reflux disease without esophagitis: Secondary | ICD-10-CM | POA: Diagnosis not present

## 2017-01-08 NOTE — Progress Notes (Signed)
Patient: Lori Johnson Female    DOB: [birth year]   83 y.o.   MRN: [id number] Visit Date: 01/08/2017  Today's Provider: [name], PA   Chief Complaint  Patient presents with  . Follow-up   Subjective:     HPI  Patient is here to follow up from AWE done today.   Hypertension, follow-up:  BP Readings from Last 3 Encounters:  01/08/17 (!) 148/52  01/08/17 (!) 148/52  11/13/16 128/60    BP at last visit was 128/60. Management changes since that visit include continue medication. She reports good compliance with treatment. She is not having side effects.  She is exercising. She is not adherent to low salt diet.   Outside blood pressures are being checked. She is experiencing none.  Patient denies chest pain, chest pressure/discomfort, irregular heart beat and palpitations.   Cardiovascular risk factors include advanced age (older than 51 for men, 85 for women), dyslipidemia and hypertension.  Use of agents associated with hypertension: none.     Weight trend: stable Wt Readings from Last 3 Encounters:  01/08/17 186 lb 6.4 oz (84.6 kg)  01/08/17 186 lb 6.4 oz (84.6 kg)  11/13/16 183 lb 3.2 oz (83.1 kg)    Current diet: in general, a "healthy" diet      Lipid/Cholesterol, Follow-up:   Last seen for this 1 years ago.  Management since that visit includes continue medication.  Last Lipid Panel:    Component Value Date/Time   CHOL 176 12/06/2015 1106   CHOL 149 10/28/2013 0435   TRIG 191 (H) 12/06/2015 1106   TRIG 52 10/28/2013 0435   HDL 36 (L) 12/06/2015 1106   HDL 44 10/28/2013 0435   CHOLHDL 4.9 (H) 12/06/2015 1106   VLDL 10 10/28/2013 0435   LDLCALC 102 (H) 12/06/2015 1106   [place] 95 10/28/2013 0435    She reports good compliance with treatment. She is not having side effects.   Wt Readings from Last 3 Encounters:  01/08/17 186 lb 6.4 oz (84.6 kg)  01/08/17 186 lb 6.4 oz (84.6 kg)  11/13/16 183 lb 3.2 oz (83.1 kg)     ------------------------------------------------------------------------ Past Medical History:  Diagnosis Date  . Angiopathy, peripheral ([place])   . ASCVD (arteriosclerotic cardiovascular disease)    with stent RCA  . Bilateral bunions   . Cervical lymphadenitis   . Chronic gout of left ankle due to renal impairment without tophus   . Chronic infection June 2014   Has chronic infection of right femoral graft with history of extensive debridement in June 2014. Was on oral Clindamycin for 3 weeks for culture positive for anerobes and staph lugdunensis. Vascular Surgeon at [facility] Remonia Richter, MD) recommended indefinite suppressive therapy with Cephalexin 500 mg qd and reassessment every 3 months. Will refill Cephalexin.)  . Chronic kidney disease   . CRF (chronic renal failure)   . GERD (gastroesophageal reflux disease)   . Hiatal hernia   . History of arterial bypass of lower extremity   . History of DVT (deep vein thrombosis)   . Hyperlipidemia   . Hypertension   . Hyperuricemia   . Macular degeneration   . Mass of right side of neck   . Nocturia   . OA (osteoarthritis)   . PVD (peripheral vascular disease) ([place])   . Renal disorder    Past Surgical History:  Procedure Laterality Date  . ABDOMINAL HYSTERECTOMY  1981  . arterial bypass right leg Right   . broken left foot Left 01/2002  .  CHOLECYSTECTOMY    . stent in leg     Family History  Problem Relation Age of Onset  . Heart disease Mother   . Heart disease Father    Allergies  Allergen Reactions  . Pletal [Cilostazol] Swelling  . Erythromycin Hives  . Codeine Nausea Only and Nausea And Vomiting  . Erythromycin Base Itching and Rash  . Penicillins Hives, Itching and Rash  . Sulfa Antibiotics Rash     Previous Medications   ACETAMINOPHEN (TYLENOL ARTHRITIS PAIN PO)    Take by mouth.   ALBUTEROL (PROAIR HFA) 108 (90 BASE) MCG/ACT INHALER    Inhale into the lungs.   ALBUTEROL (PROVENTIL HFA;VENTOLIN HFA) 108 (90  BASE) MCG/ACT INHALER    Inhale into the lungs.   AMLODIPINE (NORVASC) 10 MG TABLET    TAKE 1 TABLET EVERY DAY   CALCIUM CARB-CHOLECALCIFEROL (CALCIUM + D3 PO)    Take by mouth.   CEPHALEXIN (KEFLEX) 500 MG CAPSULE    TAKE 1 CAPSULE EVERY DAY   CHOLECALCIFEROL (VITAMIN D) 1000 UNITS TABLET    Take by mouth daily.   DICLOFENAC SODIUM (VOLTAREN) 1 % GEL    Apply 2 g topically 4 (four) times daily.   FUROSEMIDE (LASIX) 40 MG TABLET    Take 40 mg by mouth.    HYDROCORTISONE VALERATE CREAM (WESTCORT) 0.2 %       LANSOPRAZOLE (PREVACID) 30 MG CAPSULE    Take 30 mg by mouth.   LORATADINE (CLARITIN) 10 MG TABLET    Take 10 mg by mouth. Reported on 12/30/2015   LOSARTAN-HYDROCHLOROTHIAZIDE (HYZAAR) 50-12.5 MG TABLET    TAKE 1 TABLET EVERY DAY   MOMETASONE-FORMOTEROL (DULERA) 100-5 MCG/ACT AERO    Inhale into the lungs.    MULTIPLE VITAMIN (MULTIVITAMIN) CAPSULE    Take by mouth. Reported on 12/30/2015   MULTIPLE VITAMINS-MINERALS (ICAPS AREDS 2 PO)    Take by mouth.   NITROGLYCERIN (NITROSTAT) 0.4 MG SL TABLET    Place 0.4 mg under the tongue.   PRAVASTATIN (PRAVACHOL) 20 MG TABLET    TAKE 1 TABLET AT BEDTIME   RANITIDINE (ZANTAC) 150 MG TABLET    Take 150 mg by mouth 2 (two) times daily.   WARFARIN (COUMADIN) 5 MG TABLET    TAKE 1 TABLET EVERY DAY  EXCEPT TAKE 1 AND 1/2 TABLETS  ON  FRIDAYS    Review of Systems  Constitutional: Negative.   Respiratory: Negative.   Cardiovascular: Negative.   Musculoskeletal: Negative.     Social History  Substance Use Topics  . Smoking status: Former Smoker    Types: Cigarettes  . Smokeless tobacco: Never Used     Comment: quit >30-40 years ago  . Alcohol use No   Objective:   BP (!) 148/52   Pulse 64   Temp 99.1 F (37.3 C) (Oral)   Wt 186 lb 6.4 oz (84.6 kg)   BMI 32.00 kg/m   Physical Exam  Constitutional: She is oriented to person, place, and time. She appears well-developed and well-nourished. No distress.  HENT:  Head: Normocephalic and  atraumatic.  Right Ear: Hearing normal.  Left Ear: Hearing normal.  Nose: Nose normal.  Mouth/Throat: Oropharynx is clear and moist.  Eyes: Conjunctivae and lids are normal. Right eye exhibits no discharge. Left eye exhibits no discharge. No scleral icterus.  Very poor vision with macular degeneration. Can ascertain light and dark but very difficult to see TV and can't read now. Family constantly with her now.  Neck:  Neck supple.  Cardiovascular: Normal rate and regular rhythm.   Pulmonary/Chest: Effort normal. No respiratory distress.  Abdominal: Soft. Bowel sounds are normal.  Musculoskeletal: Normal range of motion.  Well healed scars from bilateral fem-pop bypass grafts and stents. Good pulses today and normal sensation. Prominent bunions both MTP joints.  Neurological: She is alert and oriented to person, place, and time.  Skin: Skin is intact. No lesion and no rash noted.  Psychiatric: She has a normal mood and affect. Her speech is normal and behavior is normal. Thought content normal.      Assessment & Plan:     1. Stage 3 chronic kidney disease Creatinine on 10-29-16 was 2.19 per Dr. Candiss Norse (nephrologist). Continues routine follow up (next appointment 03-10-17). Will get routine labs at that time.  2. Bunion Prominent both first MTP joints. Has to stretch shoes for comfort. Recommend follow up with podiatrist.  3. Gastroesophageal reflux disease with hiatal hernia Well controlled without hematemesis, melena or hematochezia as long as she takes the Pepcid 30 mg qd.   4. Essential (primary) hypertension Stable and well controlled. Still trying to walk for exercise. Some difficulty with macular degeneration and considered legally blind. Tolerating Hyzaar 50-12.5 mg qd and Amlodipine 10 mg qd. Follow up with nephrologist (Dr. Candiss Norse) as planned 4-11-8.  5. History of DVT of lower extremity Feeling well without claudication or significant swelling. Presently on Coumadin 5 mg qd  except 2.5 mg on Monday and Thursday. INR 1.9 today. Add extra 5 mg once and continue present regimen. Recheck levels in 1 month.  6. H/O arterial bypass of lower limb On indefinite suppressive Keflex for previously infected R Fem-Peroneal artery bypass with PTFE and right external iliac stent in 2014 by [facility] Vascular specialist. Follow up continued annually.

## 2017-01-08 NOTE — Patient Instructions (Signed)
Health Maintenance, Female Introduction Adopting a healthy lifestyle and getting preventive care can go a long way to promote health and wellness. Talk with your health care provider about what schedule of regular examinations is right for you. This is a good chance for you to check in with your provider about disease prevention and staying healthy. In between checkups, there are plenty of things you can do on your own. Experts have done a lot of research about which lifestyle changes and preventive measures are most likely to keep you healthy. Ask your health care provider for more information. Weight and diet Eat a healthy diet  Be sure to include plenty of vegetables, fruits, low-fat dairy products, and lean protein.  Do not eat a lot of foods high in solid fats, added sugars, or salt.  Get regular exercise. This is one of the most important things you can do for your health.  Most adults should exercise for at least 150 minutes each week. The exercise should increase your heart rate and make you sweat (moderate-intensity exercise).  Most adults should also do strengthening exercises at least twice a week. This is in addition to the moderate-intensity exercise. Maintain a healthy weight  Body mass index (BMI) is a measurement that can be used to identify possible weight problems. It estimates body fat based on height and weight. Your health care provider can help determine your BMI and help you achieve or maintain a healthy weight.  For females 4 years of age and older:  A BMI below 18.5 is considered underweight.  A BMI of 18.5 to 24.9 is normal.  A BMI of 25 to 29.9 is considered overweight.  A BMI of 30 and above is considered obese. Watch levels of cholesterol and blood lipids  You should start having your blood tested for lipids and cholesterol at 81 years of age, then have this test every 5 years.  You may need to have your cholesterol levels checked more often  if:  Your lipid or cholesterol levels are high.  You are older than 81 years of age.  You are at high risk for heart disease. Cancer screening Lung Cancer  Lung cancer screening is recommended for adults 12-31 years old who are at high risk for lung cancer because of a history of smoking.  A yearly low-dose CT scan of the lungs is recommended for people who:  Currently smoke.  Have quit within the past 15 years.  Have at least a 30-pack-year history of smoking. A pack year is smoking an average of one pack of cigarettes a day for 1 year.  Yearly screening should continue until it has been 15 years since you quit.  Yearly screening should stop if you develop a health problem that would prevent you from having lung cancer treatment. Breast Cancer  Practice breast self-awareness. This means understanding how your breasts normally appear and feel.  It also means doing regular breast self-exams. Let your health care provider know about any changes, no matter how small.  If you are in your 20s or 30s, you should have a clinical breast exam (CBE) by a health care provider every 1-3 years as part of a regular health exam.  If you are 74 or older, have a CBE every year. Also consider having a breast X-ray (mammogram) every year.  If you have a family history of breast cancer, talk to your health care provider about genetic screening.  If you are at high risk for breast cancer,  talk to your health care provider about having an MRI and a mammogram every year.  Breast cancer gene (BRCA) assessment is recommended for women who have family members with BRCA-related cancers. BRCA-related cancers include:  Breast.  Ovarian.  Tubal.  Peritoneal cancers.  Results of the assessment will determine the need for genetic counseling and BRCA1 and BRCA2 testing. Colorectal Cancer  This type of cancer can be detected and often prevented.  Routine colorectal cancer screening usually begins  at 81 years of age and continues through 81 years of age.  Your health care provider may recommend screening at an earlier age if you have risk factors for colon cancer.  Your health care provider may also recommend using home test kits to check for hidden blood in the stool.  A small camera at the end of a tube can be used to examine your colon directly (sigmoidoscopy or colonoscopy). This is done to check for the earliest forms of colorectal cancer.  Routine screening usually begins at age 50.  Direct examination of the colon should be repeated every 5-10 years through 81 years of age. However, you may need to be screened more often if early forms of precancerous polyps or small growths are found. Skin Cancer  Check your skin from head to toe regularly.  Tell your health care provider about any new moles or changes in moles, especially if there is a change in a mole's shape or color.  Also tell your health care provider if you have a mole that is larger than the size of a pencil eraser.  Always use sunscreen. Apply sunscreen liberally and repeatedly throughout the day.  Protect yourself by wearing long sleeves, pants, a wide-brimmed hat, and sunglasses whenever you are outside. Heart disease, diabetes, and high blood pressure  High blood pressure causes heart disease and increases the risk of stroke. High blood pressure is more likely to develop in:  People who have blood pressure in the high end of the normal range (130-139/85-89 mm Hg).  People who are overweight or obese.  People who are African American.  If you are 18-39 years of age, have your blood pressure checked every 3-5 years. If you are 40 years of age or older, have your blood pressure checked every year. You should have your blood pressure measured twice-once when you are at a hospital or clinic, and once when you are not at a hospital or clinic. Record the average of the two measurements. To check your blood pressure  when you are not at a hospital or clinic, you can use:  An automated blood pressure machine at a pharmacy.  A home blood pressure monitor.  If you are between 55 years and 79 years old, ask your health care provider if you should take aspirin to prevent strokes.  Have regular diabetes screenings. This involves taking a blood sample to check your fasting blood sugar level.  If you are at a normal weight and have a low risk for diabetes, have this test once every three years after 81 years of age.  If you are overweight and have a high risk for diabetes, consider being tested at a younger age or more often. Preventing infection Hepatitis B  If you have a higher risk for hepatitis B, you should be screened for this virus. You are considered at high risk for hepatitis B if:  You were born in a country where hepatitis B is common. Ask your health care provider which countries are   considered high risk.  Your parents were born in a high-risk country, and you have not been immunized against hepatitis B (hepatitis B vaccine).  You have HIV or AIDS.  You use needles to inject street drugs.  You live with someone who has hepatitis B.  You have had sex with someone who has hepatitis B.  You get hemodialysis treatment.  You take certain medicines for conditions, including cancer, organ transplantation, and autoimmune conditions. Hepatitis C  Blood testing is recommended for:  Everyone born from 1945 through 1965.  Anyone with known risk factors for hepatitis C. Osteoporosis and menopause  Osteoporosis is a disease in which the bones lose minerals and strength with aging. This can result in serious bone fractures. Your risk for osteoporosis can be identified using a bone density scan.  If you are 65 years of age or older, or if you are at risk for osteoporosis and fractures, ask your health care provider if you should be screened.  Ask your health care provider whether you should take  a calcium or vitamin D supplement to lower your risk for osteoporosis.  Menopause may have certain physical symptoms and risks.  Hormone replacement therapy may reduce some of these symptoms and risks. Talk to your health care provider about whether hormone replacement therapy is right for you. Follow these instructions at home:  Schedule regular health, dental, and eye exams.  Stay current with your immunizations.  Do not use any tobacco products including cigarettes, chewing tobacco, or electronic cigarettes.  If you are pregnant, do not drink alcohol.  If you are breastfeeding, limit how much and how often you drink alcohol.  Limit alcohol intake to no more than 1 drink per day for nonpregnant women. One drink equals 12 ounces of beer, 5 ounces of wine, or 1 ounces of hard liquor.  Do not use street drugs.  Do not share needles.  Ask your health care provider for help if you need support or information about quitting drugs.  Tell your health care provider if you often feel depressed.  Tell your health care provider if you have ever been abused or do not feel safe at home. This information is not intended to replace advice given to you by your health care provider. Make sure you discuss any questions you have with your health care provider. Document Released: 06/01/2011 Document Revised: 04/23/2016 Document Reviewed: 08/20/2015  2017 [employer]vier  

## 2017-01-08 NOTE — Progress Notes (Signed)
Subjective:   Lori Johnson is a 83 y.o. female who presents for Medicare Annual (Subsequent) preventive examination.  Review of Systems:  N/A  Cardiac Risk Factors include: advanced age (>53men, >27 women);dyslipidemia;hypertension;obesity (BMI >30kg/m2)     Objective:     Vitals: BP (!) 148/52 (BP Location: Right Arm)   Pulse 64   Temp 99.1 F (37.3 C) (Oral)   Ht 5\' 4"  (1.626 m)   Wt 186 lb 6.4 oz (84.6 kg)   BMI 32.00 kg/m   Body mass index is 32 kg/m.   Tobacco History  Smoking Status  . Former Smoker  . Types: Cigarettes  Smokeless Tobacco  . Never Used    Comment: quit >30-40 years ago     Counseling given: Not Answered   Past Medical History:  Diagnosis Date  . Angiopathy, peripheral ([place])   . ASCVD (arteriosclerotic cardiovascular disease)    with stent RCA  . Bilateral bunions   . Cervical lymphadenitis   . Chronic gout of left ankle due to renal impairment without tophus   . Chronic infection June 2014   Has chronic infection of right femoral graft with history of extensive debridement in June 2014. Was on oral Clindamycin for 3 weeks for culture positive for anerobes and staph lugdunensis. Vascular Surgeon at [facility] Remonia Richter, MD) recommended indefinite suppressive therapy with Cephalexin 500 mg qd and reassessment every 3 months. Will refill Cephalexin.)  . Chronic kidney disease   . CRF (chronic renal failure)   . GERD (gastroesophageal reflux disease)   . Hiatal hernia   . History of arterial bypass of lower extremity   . History of DVT (deep vein thrombosis)   . Hyperlipidemia   . Hypertension   . Hyperuricemia   . Macular degeneration   . Mass of right side of neck   . Nocturia   . OA (osteoarthritis)   . PVD (peripheral vascular disease) ([place])   . Renal disorder    Past Surgical History:  Procedure Laterality Date  . ABDOMINAL HYSTERECTOMY  1981  . arterial bypass right leg Right   . broken left foot Left 01/2002  . CHOLECYSTECTOMY     . stent in leg     Family History  Problem Relation Age of Onset  . Heart disease Mother   . Heart disease Father    History  Sexual Activity  . Sexual activity: Not on file    Outpatient Encounter Prescriptions as of 01/08/2017  Medication Sig  . Acetaminophen (TYLENOL ARTHRITIS PAIN PO) Take by mouth.  [name] albuterol (PROAIR HFA) 108 (90 BASE) MCG/ACT inhaler Inhale into the lungs.  . cephALEXin (KEFLEX) 500 MG capsule TAKE 1 CAPSULE EVERY DAY  . cholecalciferol (VITAMIN D) 1000 units tablet Take by mouth daily.  . diclofenac sodium (VOLTAREN) 1 % GEL Apply 2 g topically 4 (four) times daily. (Patient taking differently: Apply 2 g topically 4 (four) times daily. )  . furosemide (LASIX) 40 MG tablet Take 40 mg by mouth.   . loratadine (CLARITIN) 10 MG tablet Take 10 mg by mouth. Reported on 12/30/2015  . losartan-hydrochlorothiazide (HYZAAR) 50-12.5 MG tablet TAKE 1 TABLET EVERY DAY  . mometasone-formoterol (DULERA) 100-5 MCG/ACT AERO Inhale into the lungs.   . Multiple Vitamins-Minerals (ICAPS AREDS 2 PO) Take by mouth.  . pravastatin (PRAVACHOL) 20 MG tablet TAKE 1 TABLET AT BEDTIME  . ranitidine (ZANTAC) 150 MG tablet Take 150 mg by mouth 2 (two) times daily.  [name] warfarin (COUMADIN) 5 MG  tablet TAKE 1 TABLET EVERY DAY  EXCEPT TAKE 1 AND 1/2 TABLETS  ON  FRIDAYS (Patient taking differently: TAKE 1 TABLET EVERY DAY  EXCEPT TAKE 1/2 TABLETS  (2.5 mg) ON MONDAY AND THURSDAYS)  . albuterol (PROVENTIL HFA;VENTOLIN HFA) 108 (90 BASE) MCG/ACT inhaler Inhale into the lungs.  [name] amLODipine (NORVASC) 10 MG tablet TAKE 1 TABLET EVERY DAY  . Calcium Carb-Cholecalciferol (CALCIUM + D3 PO) Take by mouth.  . hydrocortisone valerate cream (WESTCORT) 0.2 %   . lansoprazole (PREVACID) 30 MG capsule Take 30 mg by mouth.  . Multiple Vitamin (MULTIVITAMIN) capsule Take by mouth. Reported on 12/30/2015  . nitroGLYCERIN (NITROSTAT) 0.4 MG SL tablet Place 0.4 mg under the tongue.   No facility-administered  encounter medications on file as of 01/08/2017.     Activities of Daily Living In your present state of health, do you have any difficulty performing the following activities: 01/08/2017 02/28/2016  Hearing? Y N  Vision? Y N  Difficulty concentrating or making decisions? Y N  Walking or climbing stairs? N N  Dressing or bathing? N N  Doing errands, shopping? Y N  Preparing Food and eating ? N -  Using the Toilet? N -  In the past six months, have you accidently leaked urine? Y -  Do you have problems with loss of bowel control? N -  Managing your Medications? N -  Managing your Finances? N -  Housekeeping or managing your Housekeeping? N -  Some recent data might be hidden    Patient Care Team: Margo Common, PA as PCP - General (Physician Assistant)    Assessment:     Exercise Activities and Dietary recommendations Current Exercise Habits: Home exercise routine, Type of exercise: walking, Time (Minutes): 10, Frequency (Times/Week): 1 (-2 days), Weekly Exercise (Minutes/Week): 10, Intensity: Mild, Exercise limited by: None identified  Goals    . Increase water intake          Starting 01/08/17, I will continue to drink 6 or more glasses of water a day.      Fall Risk Fall Risk  01/08/2017 02/28/2016  Falls in the past year? No No   Depression Screen PHQ 2/9 Scores 01/08/2017 02/28/2016  PHQ - 2 Score 0 0     Cognitive Function        Immunization History  Administered Date(s) Administered  . Influenza Nasal 01/22/2013  . Influenza Split 09/13/2012  . Influenza, High Dose Seasonal PF 10/01/2014, 08/14/2016  . Influenza,inj,Quad PF,36+ Mos 12/06/2015  . Pneumococcal Conjugate-13 11/13/2014   Screening Tests Health Maintenance  Topic Date Due  . TETANUS/TDAP  03/16/1949  . ZOSTAVAX  03/15/1992  . PNA vac Low Risk Adult (2 of 2 - PPSV23) 11/14/2015  . INFLUENZA VACCINE  Completed  . DEXA SCAN  Completed      Plan:  I have personally reviewed and addressed the  Medicare Annual Wellness questionnaire and have noted the following in the patient's chart:  A. Medical and social history B. Use of alcohol, tobacco or illicit drugs  C. Current medications and supplements D. Functional ability and status E.  Nutritional status F.  Physical activity G. Advance directives H. List of other physicians I.  Hospitalizations, surgeries, and ER visits in previous 12 months J.  [place] such as hearing and vision if needed, cognitive and depression L. Referrals and appointments - none  In addition, I have reviewed and discussed with patient certain preventive protocols, quality metrics, and best practice  recommendations. A written personalized care plan for preventive services as well as general preventive health recommendations were provided to patient.  See attached scanned questionnaire for additional information.   Signed,  Fabio Neighbors, LPN Nurse Health Advisor   MD Recommendations: None  Reviewed the Health Advisor's note and agree with documentation and plan. Reviewed with patient and daughter today.

## 2017-01-11 DIAGNOSIS — X32XXXA Exposure to sunlight, initial encounter: Secondary | ICD-10-CM | POA: Diagnosis not present

## 2017-01-11 DIAGNOSIS — L308 Other specified dermatitis: Secondary | ICD-10-CM | POA: Diagnosis not present

## 2017-01-11 DIAGNOSIS — L570 Actinic keratosis: Secondary | ICD-10-CM | POA: Diagnosis not present

## 2017-01-11 DIAGNOSIS — L821 Other seborrheic keratosis: Secondary | ICD-10-CM | POA: Diagnosis not present

## 2017-01-11 DIAGNOSIS — Z85828 Personal history of other malignant neoplasm of skin: Secondary | ICD-10-CM | POA: Diagnosis not present

## 2017-01-19 ENCOUNTER — Ambulatory Visit (INDEPENDENT_AMBULATORY_CARE_PROVIDER_SITE_OTHER): Payer: Medicare PPO | Admitting: Family Medicine

## 2017-01-19 ENCOUNTER — Encounter: Payer: Self-pay | Admitting: Family Medicine

## 2017-01-19 VITALS — BP 150/60 | HR 58 | Temp 98.7°F | Resp 14 | Wt 186.4 lb

## 2017-01-19 DIAGNOSIS — H8142 Vertigo of central origin, left ear: Secondary | ICD-10-CM | POA: Diagnosis not present

## 2017-01-19 DIAGNOSIS — IMO0001 Reserved for inherently not codable concepts without codable children: Secondary | ICD-10-CM

## 2017-01-19 NOTE — Patient Instructions (Signed)
Benign Positional Vertigo Introduction Vertigo is the feeling that you or your surroundings are moving when they are not. Benign positional vertigo is the most common form of vertigo. The cause of this condition is not serious (is benign). This condition is triggered by certain movements and positions (is positional). This condition can be dangerous if it occurs while you are doing something that could endanger you or others, such as driving. What are the causes? In many cases, the cause of this condition is not known. It may be caused by a disturbance in an area of the inner ear that helps your brain to sense movement and balance. This disturbance can be caused by a viral infection (labyrinthitis), head injury, or repetitive motion. What increases the risk? This condition is more likely to develop in:  Women.  People who are 50 years of age or older. What are the signs or symptoms? Symptoms of this condition usually happen when you move your head or your eyes in different directions. Symptoms may start suddenly, and they usually last for less than a minute. Symptoms may include:  Loss of balance and falling.  Feeling like you are spinning or moving.  Feeling like your surroundings are spinning or moving.  Nausea and vomiting.  Blurred vision.  Dizziness.  Involuntary eye movement (nystagmus). Symptoms can be mild and cause only slight annoyance, or they can be severe and interfere with daily life. Episodes of benign positional vertigo may return (recur) over time, and they may be triggered by certain movements. Symptoms may improve over time. How is this diagnosed? This condition is usually diagnosed by medical history and a physical exam of the head, neck, and ears. You may be referred to a health care provider who specializes in ear, nose, and throat (ENT) problems (otolaryngologist) or a provider who specializes in disorders of the nervous system (neurologist). You may have  additional testing, including:  MRI.  A CT scan.  Eye movement tests. Your health care provider may ask you to change positions quickly while he or she watches you for symptoms of benign positional vertigo, such as nystagmus. Eye movement may be tested with an electronystagmogram (ENG), caloric stimulation, the Dix-Hallpike test, or the roll test.  An electroencephalogram (EEG). This records electrical activity in your brain.  Hearing tests. How is this treated? Usually, your health care provider will treat this by moving your head in specific positions to adjust your inner ear back to normal. Surgery may be needed in severe cases, but this is rare. In some cases, benign positional vertigo may resolve on its own in 2-4 weeks. Follow these instructions at home: Safety  Move slowly.Avoid sudden body or head movements.  Avoid driving.  Avoid operating heavy machinery.  Avoid doing any tasks that would be dangerous to you or others if a vertigo episode would occur.  If you have trouble walking or keeping your balance, try using a cane for stability. If you feel dizzy or unstable, sit down right away.  Return to your normal activities as told by your health care provider. Ask your health care provider what activities are safe for you. General instructions  Take over-the-counter and prescription medicines only as told by your health care provider.  Avoid certain positions or movements as told by your health care provider.  Drink enough fluid to keep your urine clear or pale yellow.  Keep all follow-up visits as told by your health care provider. This is important. Contact a health care provider if:    You have a fever.  Your condition gets worse or you develop new symptoms.  Your family or friends notice any behavioral changes.  Your nausea or vomiting gets worse.  You have numbness or a "pins and needles" sensation. Get help right away if:  You have difficulty speaking or  moving.  You are always dizzy.  You faint.  You develop severe headaches.  You have weakness in your legs or arms.  You have changes in your hearing or vision.  You develop a stiff neck.  You develop sensitivity to light. This information is not intended to replace advice given to you by your health care provider. Make sure you discuss any questions you have with your health care provider. Document Released: 08/24/2006 Document Revised: 04/23/2016 Document Reviewed: 03/11/2015  2017 Elsevier  

## 2017-01-19 NOTE — Progress Notes (Signed)
Patient: Lori Johnson Female    DOB: 1930/08/12   83 y.o.   MRN: [id number] Visit Date: 01/19/2017  Today's Provider: [name], PA   Chief Complaint  Patient presents with  . Dizziness  . Nausea   Subjective:    Dizziness  This is a new problem. Episode onset: 1 week ago. The problem occurs intermittently. Progression since onset: worse during the night if she has to get out of bed. Associated symptoms include nausea. Exacerbated by: certain movements. Treatments tried: Dramamine. The treatment provided no relief.   Past Medical History:  Diagnosis Date  . Angiopathy, peripheral ([place])   . ASCVD (arteriosclerotic cardiovascular disease)    with stent RCA  . Bilateral bunions   . Cervical lymphadenitis   . Chronic gout of left ankle due to renal impairment without tophus   . Chronic infection June 2014   Has chronic infection of right femoral graft with history of extensive debridement in June 2014. Was on oral Clindamycin for 3 weeks for culture positive for anerobes and staph lugdunensis. Vascular Surgeon at [facility] Remonia Richter, MD) recommended indefinite suppressive therapy with Cephalexin 500 mg qd and reassessment every 3 months. Will refill Cephalexin.)  . Chronic kidney disease   . CRF (chronic renal failure)   . GERD (gastroesophageal reflux disease)   . Hiatal hernia   . History of arterial bypass of lower extremity   . History of DVT (deep vein thrombosis)   . Hyperlipidemia   . Hypertension   . Hyperuricemia   . Macular degeneration   . Mass of right side of neck   . Nocturia   . OA (osteoarthritis)   . PVD (peripheral vascular disease) ([place])   . Renal disorder    Past Surgical History:  Procedure Laterality Date  . ABDOMINAL HYSTERECTOMY  1981  . arterial bypass right leg Right   . broken left foot Left 01/2002  . CHOLECYSTECTOMY    . stent in leg     Family History  Problem Relation Age of Onset  . Heart disease Mother   . Heart disease Father     Allergies  Allergen Reactions  . Pletal [Cilostazol] Swelling  . Erythromycin Hives  . Codeine Nausea Only and Nausea And Vomiting  . Erythromycin Base Itching and Rash  . Penicillins Hives, Itching and Rash  . Sulfa Antibiotics Rash     Previous Medications   ACETAMINOPHEN (TYLENOL ARTHRITIS PAIN PO)    Take by mouth.   ALBUTEROL (PROAIR HFA) 108 (90 BASE) MCG/ACT INHALER    Inhale into the lungs.   ALBUTEROL (PROVENTIL HFA;VENTOLIN HFA) 108 (90 BASE) MCG/ACT INHALER    Inhale into the lungs.   AMLODIPINE (NORVASC) 10 MG TABLET    TAKE 1 TABLET EVERY DAY   CALCIUM CARB-CHOLECALCIFEROL (CALCIUM + D3 PO)    Take by mouth.   CEPHALEXIN (KEFLEX) 500 MG CAPSULE    TAKE 1 CAPSULE EVERY DAY   CHOLECALCIFEROL (VITAMIN D) 1000 UNITS TABLET    Take by mouth daily.   DICLOFENAC SODIUM (VOLTAREN) 1 % GEL    Apply 2 g topically 4 (four) times daily.   FUROSEMIDE (LASIX) 40 MG TABLET    Take 40 mg by mouth.    HYDROCORTISONE VALERATE CREAM (WESTCORT) 0.2 %       LANSOPRAZOLE (PREVACID) 30 MG CAPSULE    Take 30 mg by mouth.   LORATADINE (CLARITIN) 10 MG TABLET    Take 10 mg by mouth. Reported on 12/30/2015  LOSARTAN-HYDROCHLOROTHIAZIDE (HYZAAR) 50-12.5 MG TABLET    TAKE 1 TABLET EVERY DAY   MOMETASONE-FORMOTEROL (DULERA) 100-5 MCG/ACT AERO    Inhale into the lungs.    MULTIPLE VITAMIN (MULTIVITAMIN) CAPSULE    Take by mouth. Reported on 12/30/2015   MULTIPLE VITAMINS-MINERALS (ICAPS AREDS 2 PO)    Take by mouth.   NITROGLYCERIN (NITROSTAT) 0.4 MG SL TABLET    Place 0.4 mg under the tongue.   PRAVASTATIN (PRAVACHOL) 20 MG TABLET    TAKE 1 TABLET AT BEDTIME   RANITIDINE (ZANTAC) 150 MG TABLET    Take 150 mg by mouth 2 (two) times daily.   WARFARIN (COUMADIN) 5 MG TABLET    TAKE 1 TABLET EVERY DAY  EXCEPT TAKE 1 AND 1/2 TABLETS  ON  FRIDAYS    Review of Systems  Constitutional: Negative.   Respiratory: Negative.   Cardiovascular: Negative.   Gastrointestinal: Positive for nausea.   Neurological: Positive for dizziness.    Social History  Substance Use Topics  . Smoking status: Former Smoker    Types: Cigarettes  . Smokeless tobacco: Never Used     Comment: quit >30-40 years ago  . Alcohol use No   Objective:   BP (!) 150/60 (BP Location: Right Arm, Patient Position: Sitting, Cuff Size: Normal)   Pulse (!) 58   Temp 98.7 F (37.1 C) (Oral)   Resp 14   Wt 186 lb 6.4 oz (84.6 kg)   SpO2 95%   BMI 32.00 kg/m   Physical Exam  Constitutional: She is oriented to person, place, and time. She appears well-developed and well-nourished.  HENT:  Head: Normocephalic.  Right Ear: External ear normal.  Left Ear: External ear normal.  Nose: Nose normal.  Mouth/Throat: Oropharynx is clear and moist.  Eyes: Conjunctivae are normal.  History of macular edema with eye sight so poor considered legally blind now.  Neck: Neck supple.  Cardiovascular: Normal rate and regular rhythm.   Pulmonary/Chest: Effort normal and breath sounds normal.  Abdominal: Soft.  Neurological: She is alert and oriented to person, place, and time.  Slightly poor balance today. No significant vertigo a the present.      Assessment & Plan:     1. Vertigo of central origin of left ear Onset a week ago during the night when she tried to get up. Did not fall or have any loss of consciousness. Spinning stopped after sitting still on the bed for a few minutes. Has tried Meclizine 25 mg once a day with some relief. No vomiting, congestion or headache today. Normal carotid exam. History of poor balance and macular degeneration. May use Meclizine 25 mg 1/2-1 tablet TID prn. States she is not dizzy "right now". Continue routine follow up with vascular surgeon. Recent follow up with nephrologist reported normal CBC and renal function tests. Recheck if recurring. May need referral to physical therapy or ENT (has had long term hearing deficit).

## 2017-01-25 ENCOUNTER — Encounter: Payer: Self-pay | Admitting: Family Medicine

## 2017-01-29 ENCOUNTER — Other Ambulatory Visit: Payer: Self-pay | Admitting: Family Medicine

## 2017-02-04 ENCOUNTER — Other Ambulatory Visit: Payer: Self-pay | Admitting: Emergency Medicine

## 2017-02-04 LAB — POCT INR
INR: 1.9
PT: 23

## 2017-02-04 NOTE — Progress Notes (Signed)
See office note on 01-08-17 to see instructions and plan for follow up.

## 2017-02-04 NOTE — Addendum Note (Signed)
Addended by: Ermalinda Barrios on: 02/04/2017 10:17 AM   Modules accepted: Orders

## 2017-02-05 ENCOUNTER — Ambulatory Visit (INDEPENDENT_AMBULATORY_CARE_PROVIDER_SITE_OTHER): Payer: Medicare PPO

## 2017-02-05 DIAGNOSIS — Z86718 Personal history of other venous thrombosis and embolism: Secondary | ICD-10-CM

## 2017-02-05 LAB — POCT INR
INR: 2.2
PT: 26.6

## 2017-02-05 NOTE — Patient Instructions (Signed)
Anticoagulation Dose Instructions as of 02/05/2017      Lori Johnson Tue Wed Thu Fri Sat   New Dose 5 mg 2.5 mg 5 mg 5 mg 2.5 mg 5 mg 5 mg    Description   Take 5 mg daily except on Monday and Thursday take 2.5 mg and recheck 4 weeks

## 2017-02-13 ENCOUNTER — Other Ambulatory Visit: Payer: Self-pay | Admitting: Family Medicine

## 2017-03-02 DIAGNOSIS — R609 Edema, unspecified: Secondary | ICD-10-CM | POA: Diagnosis not present

## 2017-03-02 DIAGNOSIS — E872 Acidosis: Secondary | ICD-10-CM | POA: Diagnosis not present

## 2017-03-02 DIAGNOSIS — I129 Hypertensive chronic kidney disease with stage 1 through stage 4 chronic kidney disease, or unspecified chronic kidney disease: Secondary | ICD-10-CM | POA: Diagnosis not present

## 2017-03-02 DIAGNOSIS — E211 Secondary hyperparathyroidism, not elsewhere classified: Secondary | ICD-10-CM | POA: Diagnosis not present

## 2017-03-02 DIAGNOSIS — I10 Essential (primary) hypertension: Secondary | ICD-10-CM | POA: Diagnosis not present

## 2017-03-02 DIAGNOSIS — N184 Chronic kidney disease, stage 4 (severe): Secondary | ICD-10-CM | POA: Diagnosis not present

## 2017-03-05 ENCOUNTER — Ambulatory Visit (INDEPENDENT_AMBULATORY_CARE_PROVIDER_SITE_OTHER): Payer: Medicare PPO

## 2017-03-05 DIAGNOSIS — Z86718 Personal history of other venous thrombosis and embolism: Secondary | ICD-10-CM

## 2017-03-05 LAB — POCT INR
INR: 2.7
PT: 32.5

## 2017-03-05 NOTE — Patient Instructions (Signed)
Anticoagulation Dose Instructions as of 03/05/2017      Dorene Grebe Tue Wed Thu Fri Sat   New Dose 5 mg 2.5 mg 5 mg 5 mg 2.5 mg 5 mg 5 mg    Description   Take 5 mg daily except on Monday and Thursday take 2.5 mg and recheck 4 weeks

## 2017-03-05 NOTE — Progress Notes (Signed)
Very good INR level. Continue present dosage of coumadin and recheck protime in 4 weeks.

## 2017-03-10 DIAGNOSIS — I10 Essential (primary) hypertension: Secondary | ICD-10-CM | POA: Diagnosis not present

## 2017-03-10 DIAGNOSIS — N184 Chronic kidney disease, stage 4 (severe): Secondary | ICD-10-CM | POA: Diagnosis not present

## 2017-03-10 DIAGNOSIS — E872 Acidosis: Secondary | ICD-10-CM | POA: Diagnosis not present

## 2017-03-10 DIAGNOSIS — R600 Localized edema: Secondary | ICD-10-CM | POA: Diagnosis not present

## 2017-04-02 ENCOUNTER — Ambulatory Visit: Payer: Medicare PPO | Admitting: Emergency Medicine

## 2017-04-02 ENCOUNTER — Encounter: Payer: Self-pay | Admitting: Family Medicine

## 2017-04-02 ENCOUNTER — Ambulatory Visit (INDEPENDENT_AMBULATORY_CARE_PROVIDER_SITE_OTHER): Payer: Medicare PPO | Admitting: Family Medicine

## 2017-04-02 VITALS — BP 152/58 | HR 55 | Temp 98.2°F | Wt 190.0 lb

## 2017-04-02 DIAGNOSIS — Z86718 Personal history of other venous thrombosis and embolism: Secondary | ICD-10-CM

## 2017-04-02 DIAGNOSIS — N183 Chronic kidney disease, stage 3 unspecified: Secondary | ICD-10-CM

## 2017-04-02 DIAGNOSIS — R5383 Other fatigue: Secondary | ICD-10-CM | POA: Diagnosis not present

## 2017-04-02 DIAGNOSIS — I10 Essential (primary) hypertension: Secondary | ICD-10-CM

## 2017-04-02 DIAGNOSIS — D649 Anemia, unspecified: Secondary | ICD-10-CM | POA: Diagnosis not present

## 2017-04-02 LAB — POCT INR
INR: 1.8
PT: 21.1

## 2017-04-02 NOTE — Progress Notes (Signed)
Patient: Lori Johnson Female    DOB: 18-Jan-1930   83 y.o.   MRN: [id number] Visit Date: 04/02/2017  Today's Provider: [name], PA   Chief Complaint  Patient presents with  . Dizziness   Subjective:    HPI  Dizziness/Lightheadedness: Patient states she had a episode of dizziness/lightheadedness and sweats yesterday afternoon. The episode lasted about 5 minutes. Patient states she just didn't feel right. Symptoms have improved today.   Patient Active Problem List   Diagnosis Date Noted  . Closed fracture of tibial plateau 06/24/2015  . Current tear knee, medial meniscus 06/24/2015  . Arthritis of knee, degenerative 06/24/2015  . Angiopathy, peripheral ([place]) 06/06/2015  . Arterial vascular disease 06/06/2015  . Bunion 06/06/2015  . Cervical adenitis 06/06/2015  . Gout due to renal impairment of multiple sites 06/06/2015  . Chronic infection 06/06/2015  . Contusion of forearm 06/06/2015  . Arm bruise 06/06/2015  . Asthma, cough variant 06/06/2015  . Gastroesophageal reflux disease with hiatal hernia 06/06/2015  . History of DVT of lower extremity 06/06/2015  . Bergmann's syndrome 06/06/2015  . H/O arterial bypass of lower limb 06/06/2015  . HLD (hyperlipidemia) 06/06/2015  . Benign hypertension 06/06/2015  . Elevated blood uric acid level 06/06/2015  . Disorder of kidney 06/06/2015  . Lump in neck 06/06/2015  . Excessive urination at night 06/06/2015  . Arthritis, degenerative 06/06/2015  . Poor balance 06/06/2015  . Peripheral blood vessel disorder ([employer]) 06/06/2015  . Acid reflux 03/08/2013  . Atherosclerosis of native artery of extremity ([place]) 01/22/2013  . Chronic kidney disease 01/22/2013  . CAD in native artery 01/22/2013  . Essential (primary) hypertension 01/22/2013  . Atherosclerosis of native arteries of extremity with rest pain ([place]) 01/22/2013  . Arthralgia of hip or thigh 03/29/2006   Past Surgical History:  Procedure Laterality Date  . ABDOMINAL  HYSTERECTOMY  1981  . arterial bypass right leg Right   . broken left foot Left 01/2002  . CHOLECYSTECTOMY    . stent in leg     Family History  Problem Relation Age of Onset  . Heart disease Mother   . Heart disease Father   ' Allergies  Allergen Reactions  . Pletal [Cilostazol] Swelling  . Erythromycin Hives  . Codeine Nausea Only and Nausea And Vomiting  . Erythromycin Base Itching and Rash  . Penicillins Hives, Itching and Rash  . Sulfa Antibiotics Rash     Previous Medications   ACETAMINOPHEN (TYLENOL ARTHRITIS PAIN PO)    Take by mouth.   ALBUTEROL (PROAIR HFA) 108 (90 BASE) MCG/ACT INHALER    Inhale into the lungs.   ALBUTEROL (PROVENTIL HFA;VENTOLIN HFA) 108 (90 BASE) MCG/ACT INHALER    Inhale into the lungs.   AMLODIPINE (NORVASC) 10 MG TABLET    TAKE 1 TABLET EVERY DAY   CALCIUM CARB-CHOLECALCIFEROL (CALCIUM + D3 PO)    Take by mouth.   CEPHALEXIN (KEFLEX) 500 MG CAPSULE    TAKE 1 CAPSULE EVERY DAY   CHOLECALCIFEROL (VITAMIN D) 1000 UNITS TABLET    Take by mouth daily.   DICLOFENAC SODIUM (VOLTAREN) 1 % GEL    Apply 2 g topically 4 (four) times daily.   FUROSEMIDE (LASIX) 40 MG TABLET    Take 40 mg by mouth.    HYDROCORTISONE VALERATE CREAM (WESTCORT) 0.2 %       LANSOPRAZOLE (PREVACID) 30 MG CAPSULE    Take 30 mg by mouth.   LORATADINE (CLARITIN) 10 MG TABLET    Take  10 mg by mouth. Reported on 12/30/2015   LOSARTAN-HYDROCHLOROTHIAZIDE (HYZAAR) 50-12.5 MG TABLET    TAKE 1 TABLET EVERY DAY   MOMETASONE-FORMOTEROL (DULERA) 100-5 MCG/ACT AERO    Inhale into the lungs.    MULTIPLE VITAMIN (MULTIVITAMIN) CAPSULE    Take by mouth. Reported on 12/30/2015   MULTIPLE VITAMINS-MINERALS (ICAPS AREDS 2 PO)    Take by mouth.   NITROGLYCERIN (NITROSTAT) 0.4 MG SL TABLET    Place 0.4 mg under the tongue.   PRAVASTATIN (PRAVACHOL) 20 MG TABLET    TAKE 1 TABLET AT BEDTIME   RANITIDINE (ZANTAC) 150 MG TABLET    Take 150 mg by mouth 2 (two) times daily.   WARFARIN (COUMADIN) 5 MG  TABLET    TAKE 1 TABLET EVERY DAY  EXCEPT TAKE 1 AND 1/2 TABLETS  ON  FRIDAYS    Review of Systems  Constitutional: Negative.   Respiratory: Negative.   Cardiovascular: Negative.   Neurological: Positive for light-headedness.    Social History  Substance Use Topics  . Smoking status: Former Smoker    Types: Cigarettes  . Smokeless tobacco: Never Used     Comment: quit >30-40 years ago  . Alcohol use No   Objective:   BP (!) 152/58 (BP Location: Right Arm, Patient Position: Sitting, Cuff Size: Normal)   Pulse (!) 55   Temp 98.2 F (36.8 C) (Oral)   Wt 190 lb (86.2 kg)   SpO2 95%   BMI 32.61 kg/m   Physical Exam  Constitutional: She is oriented to person, place, and time. She appears well-developed and well-nourished. No distress.  HENT:  Head: Normocephalic and atraumatic.  Right Ear: Hearing normal.  Left Ear: Hearing normal.  Nose: Nose normal.  Eyes: Conjunctivae and lids are normal. Right eye exhibits no discharge. Left eye exhibits no discharge. No scleral icterus.  Neck: Neck supple.  Cardiovascular: Normal rate and regular rhythm.   Pulmonary/Chest: Effort normal and breath sounds normal. No respiratory distress.  Abdominal: Soft. Bowel sounds are normal.  Musculoskeletal: Normal range of motion.  Neurological: She is alert and oriented to person, place, and time.  Skin: Skin is intact. No lesion and no rash noted.  Psychiatric: She has a normal mood and affect. Her speech is normal and behavior is normal. Thought content normal.      Assessment & Plan:     1. Other fatigue Last episode was yesterday with a lightheaded sensation. Fatigue has been present intermittently for several months. Denies hematemesis, melena, GI upset, headaches, palpitations or dyspnea. Will receive copies of recent labs done by nephrologist for review and get Methylmalonic Acid, Folate and Ferritin levels. May start B-Complex vitamin daily and recheck pending lab reports. -  Methylmalonic Acid - Folate - Ferritin  2. Stage 3 chronic kidney disease Followed by Dr. Candiss Norse (nephrologist) 03-10-17. States she was told labs were normal and follow up will be in 3 months. With recent lightheaded sensation and fatigue, will check Folate, Methylmalonic Acid and Ferritin levels. Recheck pending reports. - Methylmalonic Acid - Folate - Ferritin  3. History of DVT of lower extremity Still on Coumadin 5 mg qd except 1/2 tablet on M&Th. Protime INR is 1.8 today. Will changed dose on Mondays to one whole tablet and recheck level in 2 weeks.  4. Essential (primary) hypertension BP at her normal today. Was elevated yesterday at home. Has been on Amlodipine 10 mg qd with Hyzaar 50-12.5 mg qd regularly. No lightheaded or dizzy sensation today. No palpitations, dyspnea  or chest pains.

## 2017-04-02 NOTE — Patient Instructions (Signed)
Anticoagulation Dose Instructions as of 04/02/2017      Lori Johnson Tue Wed Thu Fri Sat   New Dose 5 mg 2.5 mg 5 mg 5 mg 5 mg 5 mg 5 mg    Description   Take 5 mg daily except on Monday 2.5 mg recheck 2 weeks

## 2017-04-06 ENCOUNTER — Telehealth: Payer: Self-pay

## 2017-04-06 LAB — FERRITIN: FERRITIN: 47 ng/mL (ref 15–150)

## 2017-04-06 LAB — METHYLMALONIC ACID, SERUM: METHYLMALONIC ACID: 349 nmol/L (ref 0–378)

## 2017-04-06 LAB — FOLATE

## 2017-04-06 NOTE — Telephone Encounter (Signed)
-----   Message from [place], [place] sent at 04/06/2017  3:38 PM EDT ----- All iron and B12 tests are normal. Continue vitamin supplements and recheck if any further problems.

## 2017-04-06 NOTE — Telephone Encounter (Signed)
Patient's daughter Janet advised.  

## 2017-04-16 ENCOUNTER — Ambulatory Visit (INDEPENDENT_AMBULATORY_CARE_PROVIDER_SITE_OTHER): Payer: Medicare PPO

## 2017-04-16 DIAGNOSIS — Z86718 Personal history of other venous thrombosis and embolism: Secondary | ICD-10-CM

## 2017-04-16 LAB — POCT INR
INR: 3.5
PT: 41.6

## 2017-04-16 NOTE — Patient Instructions (Signed)
Anticoagulation Warfarin Dose Instructions as of 04/16/2017      Lori Johnson Tue Wed Thu Fri Sat   New Dose 5 mg 2.5 mg 5 mg 5 mg 2.5 mg 5 mg 5 mg    Description   Hold Coumadin for 1 day, then take 5 mg daily, except 2.5 mg on Monday and Thursday. Recheck 4 weeks per [name].

## 2017-05-14 ENCOUNTER — Ambulatory Visit (INDEPENDENT_AMBULATORY_CARE_PROVIDER_SITE_OTHER): Payer: Medicare PPO

## 2017-05-14 DIAGNOSIS — Z86718 Personal history of other venous thrombosis and embolism: Secondary | ICD-10-CM

## 2017-05-14 LAB — POCT INR
INR: 3
PT: 36.4

## 2017-05-14 NOTE — Patient Instructions (Signed)
Anticoagulation Warfarin Dose Instructions as of 05/14/2017      Dorene Grebe Tue Wed Thu Fri Sat   New Dose 5 mg 2.5 mg 5 mg 5 mg 2.5 mg 5 mg 5 mg    Description   5 mg daily, except 2.5 mg on Monday and Thursday. Recheck 4 weeks

## 2017-05-14 NOTE — Progress Notes (Signed)
Stable INR. Continue present dosage of coumadin and recheck level in a month.

## 2017-05-22 ENCOUNTER — Other Ambulatory Visit: Payer: Self-pay | Admitting: Family Medicine

## 2017-05-29 ENCOUNTER — Other Ambulatory Visit: Payer: Self-pay | Admitting: Family Medicine

## 2017-05-31 ENCOUNTER — Other Ambulatory Visit: Payer: Self-pay | Admitting: Family Medicine

## 2017-05-31 DIAGNOSIS — B999 Unspecified infectious disease: Secondary | ICD-10-CM

## 2017-05-31 DIAGNOSIS — I739 Peripheral vascular disease, unspecified: Secondary | ICD-10-CM

## 2017-05-31 MED ORDER — CEPHALEXIN 500 MG PO CAPS: 500.0000 mg | ORAL_CAPSULE | Freq: Every day | ORAL | 1 refills | Status: DC

## 2017-06-11 ENCOUNTER — Ambulatory Visit (INDEPENDENT_AMBULATORY_CARE_PROVIDER_SITE_OTHER): Payer: Medicare PPO

## 2017-06-11 DIAGNOSIS — Z86718 Personal history of other venous thrombosis and embolism: Secondary | ICD-10-CM | POA: Diagnosis not present

## 2017-06-11 LAB — POCT INR
INR: 2.4
PT: 29.1

## 2017-06-11 NOTE — Patient Instructions (Signed)
Anticoagulation Warfarin Dose Instructions as of 06/11/2017      Dorene Grebe Tue Wed Thu Fri Sat   New Dose 5 mg 2.5 mg 5 mg 5 mg 2.5 mg 5 mg 5 mg    Description   5 mg daily, except 2.5 mg on Monday and Thursday. Recheck 4 weeks  Cut back a little on the amount of greens in diet

## 2017-07-07 ENCOUNTER — Ambulatory Visit (INDEPENDENT_AMBULATORY_CARE_PROVIDER_SITE_OTHER): Payer: Medicare PPO

## 2017-07-07 DIAGNOSIS — Z86718 Personal history of other venous thrombosis and embolism: Secondary | ICD-10-CM

## 2017-07-07 LAB — POCT INR
INR: 2.4
PT: 29.2

## 2017-07-07 NOTE — Patient Instructions (Signed)
Anticoagulation Warfarin Dose Instructions as of 07/07/2017      Lori Johnson Tue Wed Thu Fri Sat   New Dose 5 mg 2.5 mg 5 mg 5 mg 2.5 mg 5 mg 5 mg    Description   5 mg daily, except 2.5 mg on Monday and Thursday. Recheck 4 weeks

## 2017-07-12 DIAGNOSIS — R609 Edema, unspecified: Secondary | ICD-10-CM | POA: Diagnosis not present

## 2017-07-12 DIAGNOSIS — I10 Essential (primary) hypertension: Secondary | ICD-10-CM | POA: Diagnosis not present

## 2017-07-12 DIAGNOSIS — E872 Acidosis: Secondary | ICD-10-CM | POA: Diagnosis not present

## 2017-07-12 DIAGNOSIS — E211 Secondary hyperparathyroidism, not elsewhere classified: Secondary | ICD-10-CM | POA: Diagnosis not present

## 2017-07-12 DIAGNOSIS — N184 Chronic kidney disease, stage 4 (severe): Secondary | ICD-10-CM | POA: Diagnosis not present

## 2017-07-12 LAB — BASIC METABOLIC PANEL
BUN: 37 — AB (ref 4–21)
Creatinine: 2.4 — AB (ref 0.5–1.1)
Glucose: 96
POTASSIUM: 4.2 (ref 3.4–5.3)
Sodium: 142 (ref 137–147)

## 2017-07-12 LAB — CBC AND DIFFERENTIAL
HEMATOCRIT: 39 (ref 36–46)
HEMOGLOBIN: 12.5 (ref 12.0–16.0)
NEUTROS ABS: 2
PLATELETS: 191 (ref 150–399)
WBC: 5.5

## 2017-07-15 DIAGNOSIS — R609 Edema, unspecified: Secondary | ICD-10-CM | POA: Diagnosis not present

## 2017-07-15 DIAGNOSIS — I10 Essential (primary) hypertension: Secondary | ICD-10-CM | POA: Diagnosis not present

## 2017-07-15 DIAGNOSIS — N184 Chronic kidney disease, stage 4 (severe): Secondary | ICD-10-CM | POA: Diagnosis not present

## 2017-07-15 DIAGNOSIS — E211 Secondary hyperparathyroidism, not elsewhere classified: Secondary | ICD-10-CM | POA: Diagnosis not present

## 2017-08-03 ENCOUNTER — Encounter: Payer: Self-pay | Admitting: Family Medicine

## 2017-08-06 ENCOUNTER — Ambulatory Visit (INDEPENDENT_AMBULATORY_CARE_PROVIDER_SITE_OTHER): Payer: Medicare PPO

## 2017-08-06 DIAGNOSIS — Z86718 Personal history of other venous thrombosis and embolism: Secondary | ICD-10-CM

## 2017-08-06 LAB — POCT INR
INR: 2.3
PT: 27.2

## 2017-08-06 NOTE — Patient Instructions (Signed)
INR as of 08/06/2017 and Previous Warfarin Dosing Information    INR Dt INR Goal Madilyn Fireman Sun Mon Tue Wed Thu Fri Sat   08/06/2017 2.3 2.0-3.0 30 mg 5 mg 2.5 mg 5 mg 5 mg 2.5 mg 5 mg 5 mg    Previous description   5 mg daily, except 2.5 mg on Monday and Thursday. Recheck 4 weeks     Anticoagulation Warfarin Dose Instructions as of 08/06/2017      Total Sun Mon Tue Wed Thu Fri Sat   New Dose 30 mg 5 mg 2.5 mg 5 mg 5 mg 2.5 mg 5 mg 5 mg     -  (2.5 mg x 1)  -  -  (2.5 mg x 1)  -  -      (5 mg x 1)  -  (5 mg x 1)  (5 mg x 1)  -  (5 mg x 1)  (5 mg x 1)                         Description   5 mg daily, except 2.5 mg on Monday and Thursday. Recheck 4 weeks

## 2017-09-03 ENCOUNTER — Ambulatory Visit (INDEPENDENT_AMBULATORY_CARE_PROVIDER_SITE_OTHER): Payer: Medicare PPO | Admitting: Emergency Medicine

## 2017-09-03 DIAGNOSIS — Z23 Encounter for immunization: Secondary | ICD-10-CM | POA: Diagnosis not present

## 2017-09-03 DIAGNOSIS — Z86718 Personal history of other venous thrombosis and embolism: Secondary | ICD-10-CM | POA: Diagnosis not present

## 2017-09-03 LAB — POCT INR
INR: 3.9
PT: 47

## 2017-09-17 ENCOUNTER — Ambulatory Visit (INDEPENDENT_AMBULATORY_CARE_PROVIDER_SITE_OTHER): Payer: Medicare PPO

## 2017-09-17 DIAGNOSIS — Z86718 Personal history of other venous thrombosis and embolism: Secondary | ICD-10-CM

## 2017-09-17 LAB — POCT INR
INR: 4.5
PT: 54.3

## 2017-09-17 NOTE — Patient Instructions (Signed)
Hold for 2 days continue current dose of 5mg  daily except 2.5mg  on Mondays and Thursdays. Pt also advised to increase foods in Vitamin K some.  Recheck in two weeks.

## 2017-09-21 DIAGNOSIS — Z85828 Personal history of other malignant neoplasm of skin: Secondary | ICD-10-CM | POA: Diagnosis not present

## 2017-09-21 DIAGNOSIS — Z08 Encounter for follow-up examination after completed treatment for malignant neoplasm: Secondary | ICD-10-CM | POA: Diagnosis not present

## 2017-09-21 DIAGNOSIS — X32XXXA Exposure to sunlight, initial encounter: Secondary | ICD-10-CM | POA: Diagnosis not present

## 2017-09-21 DIAGNOSIS — L308 Other specified dermatitis: Secondary | ICD-10-CM | POA: Diagnosis not present

## 2017-09-21 DIAGNOSIS — L821 Other seborrheic keratosis: Secondary | ICD-10-CM | POA: Diagnosis not present

## 2017-09-21 DIAGNOSIS — L570 Actinic keratosis: Secondary | ICD-10-CM | POA: Diagnosis not present

## 2017-09-21 DIAGNOSIS — D485 Neoplasm of uncertain behavior of skin: Secondary | ICD-10-CM | POA: Diagnosis not present

## 2017-09-21 DIAGNOSIS — C44729 Squamous cell carcinoma of skin of left lower limb, including hip: Secondary | ICD-10-CM | POA: Diagnosis not present

## 2017-10-01 ENCOUNTER — Ambulatory Visit (INDEPENDENT_AMBULATORY_CARE_PROVIDER_SITE_OTHER): Payer: Medicare PPO | Admitting: Emergency Medicine

## 2017-10-01 DIAGNOSIS — Z86718 Personal history of other venous thrombosis and embolism: Secondary | ICD-10-CM | POA: Diagnosis not present

## 2017-10-01 LAB — POCT INR
INR: 2.7
PT: 31.8

## 2017-10-01 NOTE — Progress Notes (Signed)
Continue present dosage of coumadin and recheck in a month.

## 2017-10-16 ENCOUNTER — Inpatient Hospital Stay
Admission: EM | Admit: 2017-10-16 | Discharge: 2017-10-20 | DRG: 377 | Disposition: A | Discharge status: Home / Self Care | Payer: Medicare PPO | Attending: Internal Medicine | Admitting: Internal Medicine

## 2017-10-16 ENCOUNTER — Other Ambulatory Visit: Payer: Self-pay

## 2017-10-16 ENCOUNTER — Emergency Department: Payer: Medicare PPO

## 2017-10-16 ENCOUNTER — Encounter: Payer: Self-pay | Admitting: *Deleted

## 2017-10-16 DIAGNOSIS — J449 Chronic obstructive pulmonary disease, unspecified: Secondary | ICD-10-CM | POA: Diagnosis present

## 2017-10-16 DIAGNOSIS — R791 Abnormal coagulation profile: Secondary | ICD-10-CM | POA: Diagnosis present

## 2017-10-16 DIAGNOSIS — D649 Anemia, unspecified: Secondary | ICD-10-CM | POA: Diagnosis not present

## 2017-10-16 DIAGNOSIS — Z01810 Encounter for preprocedural cardiovascular examination: Secondary | ICD-10-CM | POA: Diagnosis not present

## 2017-10-16 DIAGNOSIS — R944 Abnormal results of kidney function studies: Secondary | ICD-10-CM | POA: Diagnosis present

## 2017-10-16 DIAGNOSIS — K920 Hematemesis: Secondary | ICD-10-CM | POA: Diagnosis not present

## 2017-10-16 DIAGNOSIS — K219 Gastro-esophageal reflux disease without esophagitis: Secondary | ICD-10-CM | POA: Diagnosis present

## 2017-10-16 DIAGNOSIS — I129 Hypertensive chronic kidney disease with stage 1 through stage 4 chronic kidney disease, or unspecified chronic kidney disease: Secondary | ICD-10-CM | POA: Diagnosis present

## 2017-10-16 DIAGNOSIS — R112 Nausea with vomiting, unspecified: Secondary | ICD-10-CM | POA: Diagnosis not present

## 2017-10-16 DIAGNOSIS — K922 Gastrointestinal hemorrhage, unspecified: Secondary | ICD-10-CM | POA: Diagnosis not present

## 2017-10-16 DIAGNOSIS — I739 Peripheral vascular disease, unspecified: Secondary | ICD-10-CM | POA: Diagnosis present

## 2017-10-16 DIAGNOSIS — H3530 Unspecified macular degeneration: Secondary | ICD-10-CM | POA: Diagnosis present

## 2017-10-16 DIAGNOSIS — M1A9XX0 Chronic gout, unspecified, without tophus (tophi): Secondary | ICD-10-CM | POA: Diagnosis present

## 2017-10-16 DIAGNOSIS — I2510 Atherosclerotic heart disease of native coronary artery without angina pectoris: Secondary | ICD-10-CM | POA: Diagnosis not present

## 2017-10-16 DIAGNOSIS — M6281 Muscle weakness (generalized): Secondary | ICD-10-CM | POA: Diagnosis not present

## 2017-10-16 DIAGNOSIS — R0902 Hypoxemia: Secondary | ICD-10-CM

## 2017-10-16 DIAGNOSIS — Z23 Encounter for immunization: Secondary | ICD-10-CM | POA: Diagnosis not present

## 2017-10-16 DIAGNOSIS — I350 Nonrheumatic aortic (valve) stenosis: Secondary | ICD-10-CM | POA: Diagnosis present

## 2017-10-16 DIAGNOSIS — Z7901 Long term (current) use of anticoagulants: Secondary | ICD-10-CM

## 2017-10-16 DIAGNOSIS — R7989 Other specified abnormal findings of blood chemistry: Secondary | ICD-10-CM | POA: Diagnosis not present

## 2017-10-16 DIAGNOSIS — I248 Other forms of acute ischemic heart disease: Secondary | ICD-10-CM | POA: Diagnosis not present

## 2017-10-16 DIAGNOSIS — I214 Non-ST elevation (NSTEMI) myocardial infarction: Secondary | ICD-10-CM | POA: Diagnosis not present

## 2017-10-16 DIAGNOSIS — Z79899 Other long term (current) drug therapy: Secondary | ICD-10-CM | POA: Diagnosis not present

## 2017-10-16 DIAGNOSIS — R110 Nausea: Secondary | ICD-10-CM | POA: Diagnosis not present

## 2017-10-16 DIAGNOSIS — Z87891 Personal history of nicotine dependence: Secondary | ICD-10-CM | POA: Diagnosis not present

## 2017-10-16 DIAGNOSIS — R748 Abnormal levels of other serum enzymes: Secondary | ICD-10-CM | POA: Diagnosis not present

## 2017-10-16 DIAGNOSIS — E785 Hyperlipidemia, unspecified: Secondary | ICD-10-CM | POA: Diagnosis present

## 2017-10-16 DIAGNOSIS — D62 Acute posthemorrhagic anemia: Secondary | ICD-10-CM | POA: Diagnosis not present

## 2017-10-16 DIAGNOSIS — R42 Dizziness and giddiness: Secondary | ICD-10-CM | POA: Diagnosis not present

## 2017-10-16 DIAGNOSIS — N189 Chronic kidney disease, unspecified: Secondary | ICD-10-CM | POA: Diagnosis not present

## 2017-10-16 DIAGNOSIS — N184 Chronic kidney disease, stage 4 (severe): Secondary | ICD-10-CM | POA: Diagnosis not present

## 2017-10-16 DIAGNOSIS — R079 Chest pain, unspecified: Secondary | ICD-10-CM | POA: Diagnosis not present

## 2017-10-16 DIAGNOSIS — Z955 Presence of coronary angioplasty implant and graft: Secondary | ICD-10-CM

## 2017-10-16 HISTORY — DX: Embolism and thrombosis of unspecified artery: I74.9

## 2017-10-16 LAB — CBC
HCT: 22.5 % — ABNORMAL LOW (ref 35.0–47.0)
HEMOGLOBIN: 7.6 g/dL — AB (ref 12.0–16.0)
MCH: 31 pg (ref 26.0–34.0)
MCHC: 33.9 g/dL (ref 32.0–36.0)
MCV: 91.5 fL (ref 80.0–100.0)
PLATELETS: 160 10*3/uL (ref 150–440)
RBC: 2.46 MIL/uL — AB (ref 3.80–5.20)
RDW: 14.5 % (ref 11.5–14.5)
WBC: 5.2 10*3/uL (ref 3.6–11.0)

## 2017-10-16 LAB — CBC WITH DIFFERENTIAL/PLATELET
Basophils Absolute: 0 10*3/uL (ref 0–0.1)
Basophils Relative: 0 %
EOS ABS: 0.3 10*3/uL (ref 0–0.7)
EOS PCT: 3 %
HCT: 26.3 % — ABNORMAL LOW (ref 35.0–47.0)
Hemoglobin: 8.4 g/dL — ABNORMAL LOW (ref 12.0–16.0)
LYMPHS ABS: 2.4 10*3/uL (ref 1.0–3.6)
LYMPHS PCT: 24 %
MCH: 30.4 pg (ref 26.0–34.0)
MCHC: 31.8 g/dL — AB (ref 32.0–36.0)
MCV: 95.4 fL (ref 80.0–100.0)
MONO ABS: 0.9 10*3/uL (ref 0.2–0.9)
MONOS PCT: 9 %
Neutro Abs: 6.3 10*3/uL (ref 1.4–6.5)
Neutrophils Relative %: 64 %
PLATELETS: 219 10*3/uL (ref 150–440)
RBC: 2.76 MIL/uL — ABNORMAL LOW (ref 3.80–5.20)
RDW: 13.1 % (ref 11.5–14.5)
WBC: 9.9 10*3/uL (ref 3.6–11.0)

## 2017-10-16 LAB — COMPREHENSIVE METABOLIC PANEL
ALT: 12 U/L — AB (ref 14–54)
ANION GAP: 9 (ref 5–15)
AST: 24 U/L (ref 15–41)
Albumin: 2.8 g/dL — ABNORMAL LOW (ref 3.5–5.0)
Alkaline Phosphatase: 46 U/L (ref 38–126)
BUN: 49 mg/dL — ABNORMAL HIGH (ref 6–20)
CALCIUM: 9.4 mg/dL (ref 8.9–10.3)
CHLORIDE: 104 mmol/L (ref 101–111)
CO2: 24 mmol/L (ref 22–32)
CREATININE: 1.96 mg/dL — AB (ref 0.44–1.00)
GFR, EST AFRICAN AMERICAN: 26 mL/min — AB (ref >60)
GFR, EST NON AFRICAN AMERICAN: 22 mL/min — AB (ref >60)
Glucose, Bld: 146 mg/dL — ABNORMAL HIGH (ref 65–99)
Potassium: 4.1 mmol/L (ref 3.5–5.1)
SODIUM: 137 mmol/L (ref 135–145)
Total Bilirubin: 0.9 mg/dL (ref 0.3–1.2)
Total Protein: 5.7 g/dL — ABNORMAL LOW (ref 6.5–8.1)

## 2017-10-16 LAB — PROTIME-INR
INR: 3.22
INR: 1.34
PROTHROMBIN TIME: 32.7 s — AB (ref 11.4–15.2)
Prothrombin Time: 16.5 seconds — ABNORMAL HIGH (ref 11.4–15.2)

## 2017-10-16 LAB — TROPONIN I
TROPONIN I: 0.04 ng/mL — AB (ref <0.03)
Troponin I: 0.03 ng/mL (ref <0.03)
Troponin I: 0.97 ng/mL (ref <0.03)

## 2017-10-16 LAB — APTT: aPTT: 33 seconds (ref 24–36)

## 2017-10-16 LAB — MRSA PCR SCREENING: MRSA by PCR: NEGATIVE

## 2017-10-16 LAB — PREPARE RBC (CROSSMATCH)

## 2017-10-16 LAB — ABO/RH: ABO/RH(D): O POS

## 2017-10-16 LAB — HEMOGLOBIN: HEMOGLOBIN: 7 g/dL — AB (ref 12.0–16.0)

## 2017-10-16 LAB — LIPASE, BLOOD: LIPASE: 33 U/L (ref 11–51)

## 2017-10-16 MED ORDER — ACETAMINOPHEN 650 MG RE SUPP
650.0000 mg | Freq: Four times a day (QID) | RECTAL | Status: DC | PRN
Start: 2017-10-16 — End: 2017-10-20

## 2017-10-16 MED ORDER — ACETAMINOPHEN 325 MG PO TABS
650.0000 mg | ORAL_TABLET | Freq: Four times a day (QID) | ORAL | Status: DC | PRN
  Administered 2017-10-16 – 2017-10-19 (×4): 650 mg via ORAL
  Filled 2017-10-16 (×4): qty 2

## 2017-10-16 MED ORDER — SODIUM CHLORIDE 0.9 % IV SOLN
80.0000 mg | Freq: Once | INTRAVENOUS | Status: AC
  Administered 2017-10-16: 07:00:00 80 mg via INTRAVENOUS
  Filled 2017-10-16: qty 80

## 2017-10-16 MED ORDER — ONDANSETRON HCL 4 MG/2ML IJ SOLN
4.0000 mg | Freq: Once | INTRAMUSCULAR | Status: AC
  Administered 2017-10-16: 4 mg via INTRAVENOUS
  Filled 2017-10-16: qty 2

## 2017-10-16 MED ORDER — SODIUM CHLORIDE 0.9 % IV SOLN: Freq: Once | INTRAVENOUS | Status: DC

## 2017-10-16 MED ORDER — SODIUM CHLORIDE 0.9 % IV SOLN
INTRAVENOUS | Status: DC
  Administered 2017-10-16 – 2017-10-18 (×3): via INTRAVENOUS

## 2017-10-16 MED ORDER — SODIUM CHLORIDE 0.9 % IV SOLN: Freq: Once | INTRAVENOUS | Status: AC

## 2017-10-16 MED ORDER — ONDANSETRON HCL 4 MG/2ML IJ SOLN: 4.0000 mg | Freq: Four times a day (QID) | INTRAMUSCULAR | Status: DC | PRN

## 2017-10-16 MED ORDER — MORPHINE SULFATE (PF) 2 MG/ML IV SOLN
1.0000 mg | Freq: Once | INTRAVENOUS | Status: AC
  Administered 2017-10-16: 1 mg via INTRAVENOUS
  Filled 2017-10-16: qty 1

## 2017-10-16 MED ORDER — VITAMIN K1 10 MG/ML IJ SOLN
5.0000 mg | Freq: Once | INTRAMUSCULAR | Status: AC
  Administered 2017-10-16: 5 mg via INTRAVENOUS
  Filled 2017-10-16: qty 0.5

## 2017-10-16 MED ORDER — MOMETASONE FURO-FORMOTEROL FUM 100-5 MCG/ACT IN AERO
2.0000 | INHALATION_SPRAY | Freq: Two times a day (BID) | RESPIRATORY_TRACT | Status: DC
  Administered 2017-10-16 – 2017-10-20 (×8): 2 via RESPIRATORY_TRACT
  Filled 2017-10-16: qty 8.8

## 2017-10-16 MED ORDER — ONDANSETRON HCL 4 MG PO TABS: 4.0000 mg | ORAL_TABLET | Freq: Four times a day (QID) | ORAL | Status: DC | PRN

## 2017-10-16 MED ORDER — NITROGLYCERIN 2 % TD OINT
0.5000 [in_us] | TOPICAL_OINTMENT | Freq: Four times a day (QID) | TRANSDERMAL | Status: DC
  Administered 2017-10-16 – 2017-10-19 (×12): 0.5 [in_us] via TOPICAL
  Filled 2017-10-16 (×12): qty 1

## 2017-10-16 MED ORDER — PNEUMOCOCCAL VAC POLYVALENT 25 MCG/0.5ML IJ INJ
0.5000 mL | INJECTION | INTRAMUSCULAR | Status: AC
  Administered 2017-10-17: 0.5 mL via INTRAMUSCULAR
  Filled 2017-10-16: qty 0.5

## 2017-10-16 MED ORDER — NITROGLYCERIN 2 % TD OINT: 0.5000 [in_us] | TOPICAL_OINTMENT | Freq: Four times a day (QID) | TRANSDERMAL | Status: DC

## 2017-10-16 MED ORDER — SODIUM CHLORIDE 0.9 % IV SOLN
8.0000 mg/h | INTRAVENOUS | Status: DC
  Administered 2017-10-16 – 2017-10-18 (×6): 8 mg/h via INTRAVENOUS
  Filled 2017-10-16 (×7): qty 80

## 2017-10-16 MED ORDER — SODIUM CHLORIDE 0.9 % IV BOLUS (SEPSIS)
1000.0000 mL | Freq: Once | INTRAVENOUS | Status: AC
  Administered 2017-10-16: 1000 mL via INTRAVENOUS

## 2017-10-16 NOTE — Progress Notes (Signed)
Pt transferred to CCU from ED. She is a&o x 4 with no complaints this afternoon. First unit of FFP completed. Family with pt at bedside. VSS: BP (!) 160/48   Pulse 65   Temp 98.4 F (36.9 C) (Oral)   Resp (!) 23   Ht 5\' 4"  (1.626 m)   Wt 84.2 kg (185 lb 10 oz)   SpO2 95%   BMI 31.86 kg/m . MD awaiting results of repeat hemoglobin. GI consult called.

## 2017-10-16 NOTE — ED Notes (Signed)
Called report to [facility], stated bed is dirty and will call when it's cleaned.

## 2017-10-16 NOTE — ED Triage Notes (Signed)
Pt states she awoke in the night and vomitted and then again this am and called family and 911. Per ems pt vomitted a large amount with clots of blood while on stretcher

## 2017-10-16 NOTE — ED Notes (Signed)
Removed patient from bedpan

## 2017-10-16 NOTE — Progress Notes (Signed)
Pt continues to have chest pain that has not improved and she has started to develop multiple PACs. Dr. Jefferson Fuel notified ane he stated to order 0.5" of nitro paste topically and to check another troponin once her PRBC has completed.

## 2017-10-16 NOTE — Consult Note (Signed)
Lori Johnson    ASSESSMENT/PLAN   Patient with underlying history of peripheral vascular disease, status post bilateral lower extremity stenting with residual thrombus in the right lower extremity, on chronic anticoagulation therapy, presented with upper GI bleed with elevated INR 3.22. Will reverse INR with FFP, transfuse as needed, place on Protonix and consult gastroenterology  Elevated BUN/creatinine. Some of this may be proximal bleed with resorption, will follow closely if progresses will obtain renal ultrasound  Elevated troponin. Troponin at 0.04, EKG does not reveal any ischemic changes. Most likely supply demand ischemia secondary to anemia. We'll transfuse 1 unit of packed red blood cells since hemoglobin has decreased to 7 will monitor closely   Critical care time 40 minutes  Name: Lori Johnson MRN: [id number] DOB: 1930-06-23    ADMISSION DATE:  10/16/2017 Johnson DATE:  10/16/17  REFERRING MD :  Hospitalist Service  CHIEF COMPLAINT:  Vomiting up blood   HISTORY OF PRESENT ILLNESS:  Lori Johnson is a very pleasant 83 year old female with a past medical history remarkable for hypertension, hyperlipidemia, hyperuricemia, osteoarthritis, severe peripheral vascular disease, status post bilateral stenting with residual arterial thrombus on chronic anticoagulation therapy. Presented to the emergency department after multiple episodes of hematemesis and near syncope. Around 5 AM this morning patient woke up not feeling well, felt like she was going to pass out. Then she complained of nausea and threw up a cup full of blood which was mostly fresh and some dark blood in it. After that she had 3 more of those episodes just less quantity. He is the hemoglobin is at 8.4, baseline around 11-12.  Patient's initial hemoglobin was 8.4, INR was 3.22. Is admitted for upper GI bleed, reversal of anticoagulation and monitoring in the intensive care unit   PAST  MEDICAL HISTORY :  Past Medical History:  Diagnosis Date  . Angiopathy, peripheral ([place])   . Arterial embolus and thrombosis ([place])   . ASCVD (arteriosclerotic cardiovascular disease)    with stent RCA  . Bilateral bunions   . Cervical lymphadenitis   . Chronic gout of left ankle due to renal impairment without tophus   . Chronic infection June 2014   Has chronic infection of right femoral graft with history of extensive debridement in June 2014. Was on oral Clindamycin for 3 weeks for culture positive for anerobes and staph lugdunensis. Vascular Surgeon at [facility] Remonia Richter, MD) recommended indefinite suppressive therapy with Cephalexin 500 mg qd and reassessment every 3 months. Will refill Cephalexin.)  . Chronic kidney disease   . CRF (chronic renal failure)   . GERD (gastroesophageal reflux disease)   . Hiatal hernia   . History of arterial bypass of lower extremity   . Hyperlipidemia   . Hypertension   . Hyperuricemia   . Macular degeneration   . Mass of right side of neck   . Nocturia   . OA (osteoarthritis)   . PVD (peripheral vascular disease) ([place])   . Renal disorder    Past Surgical History:  Procedure Laterality Date  . ABDOMINAL HYSTERECTOMY  1981  . arterial bypass right leg Right   . broken left foot Left 01/2002  . CHOLECYSTECTOMY    . CORONARY ANGIOPLASTY WITH STENT PLACEMENT    . stent in leg     Prior to Admission medications   Medication Sig Start Date End Date Taking? Authorizing Provider  amLODipine (NORVASC) 10 MG tablet TAKE 1 TABLET EVERY DAY 01/29/17  Yes Chrismon, Vickki Muff, PA  cephALEXin (KEFLEX) 500 MG capsule Take 1 capsule (500 mg total) by mouth daily. 05/31/17  Yes Chrismon, Vickki Muff, PA  furosemide (LASIX) 40 MG tablet Take 40 mg every other day by mouth.  04/02/15  Yes [provider]  losartan-hydrochlorothiazide (HYZAAR) 50-12.5 MG tablet TAKE 1 TABLET EVERY DAY 01/29/17  Yes Chrismon, Vickki Muff, PA  Multiple Vitamin (MULTIVITAMIN) capsule  Take by mouth. Reported on 12/30/2015   Yes [provider]  Multiple Vitamins-Minerals (ICAPS AREDS 2 PO) Take by mouth.   Yes [provider]  pravastatin (PRAVACHOL) 20 MG tablet TAKE 1 TABLET AT BEDTIME 02/15/17  Yes Chrismon, Vickki Muff, PA  warfarin (COUMADIN) 5 MG tablet Take 5 mg by mouth daily except 2.5 mg on Monday and Thursdays. 05/24/17  Yes Chrismon, Vickki Muff, PA  Acetaminophen (TYLENOL ARTHRITIS PAIN PO) Take by mouth.    [provider]  cholecalciferol (VITAMIN D) 1000 units tablet Take by mouth daily.    [provider]  diclofenac sodium (VOLTAREN) 1 % GEL Apply 2 g topically 4 (four) times daily. Patient not taking: Reported on 10/16/2017 10/23/16   Chrismon, Vickki Muff, PA  hydrocortisone valerate cream (WESTCORT) 0.2 %  08/26/16   [provider]  ranitidine (ZANTAC) 150 MG tablet Take 150 mg by mouth 2 (two) times daily.    [provider]   Allergies  Allergen Reactions  . Pletal [Cilostazol] Swelling  . Erythromycin Hives  . Codeine Nausea Only and Nausea And Vomiting  . Erythromycin Base Itching and Rash  . Penicillins Hives, Itching and Rash  . Sulfa Antibiotics Rash    FAMILY HISTORY:  Family History  Problem Relation Age of Onset  . Heart disease Mother   . Heart disease Father    SOCIAL HISTORY:  reports that she has quit smoking. Her smoking use included cigarettes. she has never used smokeless tobacco. She reports that she does not drink alcohol or use drugs.  REVIEW OF SYSTEMS:   Constitutional: Feels well. Complaining of some mild chest pain.  Pulmonary: Denies dyspnea.   The remainder of systems were reviewed and were found to be negative other than what is documented in the HPI.    VITAL SIGNS: Temp:  [98 F (36.7 C)-98.6 F (37 C)] 98.4 F (36.9 C) (11/17 1336) Pulse Rate:  [46-70] 65 (11/17 1336) Resp:  [13-23] 23 (11/17 1336) BP: (112-197)/(37-103) 160/48 (11/17 1336) SpO2:  [91 %-100 %]  95 % (11/17 1336) Weight:  [78.9 kg (174 lb)-84.2 kg (185 lb 10 oz)] 84.2 kg (185 lb 10 oz) (11/17 1237) HEMODYNAMICS:  INTAKE / OUTPUT:  Intake/Output Summary (Last 24 hours) at 10/16/2017 1408 Last data filed at 10/16/2017 1336 Gross per 24 hour  Intake 476.67 ml  Output -  Net 476.67 ml    Physical Examination:   VS: BP (!) 160/48   Pulse 65   Temp 98.4 F (36.9 C) (Oral)   Resp (!) 23   Ht 5\' 4"  (1.626 m)   Wt 84.2 kg (185 lb 10 oz)   SpO2 95%   BMI 31.86 kg/m   General Appearance: No distress  Neuro:without focal findings, mental status, speech normal,. HEENT: PERRLA, EOM intact, no ptosis, no other lesions noticed;  Pulmonary: normal breath sounds., diaphragmatic excursion normal. CardiovascularNormal S1,S2.  No m/r/g.    Abdomen: Benign, Soft, non-tender, No masses, hepatosplenomegaly, No lymphadenopathy Endoc: No evident thyromegaly, no signs of acromegaly. Skin:   warm, no rashes, no ecchymosis  Extremities: normal, no  cyanosis, clubbing, no edema, warm with normal capillary refill.    LABS: Reviewed   LABORATORY PANEL:   CBC Recent Labs  Lab 10/16/17 0604 10/16/17 1256  WBC 9.9  --   HGB 8.4* 7.0*  HCT 26.3*  --   PLT 219  --     Chemistries  Recent Labs  Lab 10/16/17 0604  NA 137  K 4.1  CL 104  CO2 24  GLUCOSE 146*  BUN 49*  CREATININE 1.96*  CALCIUM 9.4  AST 24  ALT 12*  ALKPHOS 46  BILITOT 0.9    No results for input(s): GLUCAP in the last 168 hours. No results for input(s): PHART, PCO2ART, PO2ART in the last 168 hours. Recent Labs  Lab 10/16/17 0604  AST 24  ALT 12*  ALKPHOS 46  BILITOT 0.9  ALBUMIN 2.8*    Cardiac Enzymes Recent Labs  Lab 10/16/17 1256  TROPONINI 0.04*    RADIOLOGY:  Dg Chest Port 1 View  Result Date: 10/16/2017 CLINICAL DATA:  Acute onset of vomiting.  Hematemesis. EXAM: PORTABLE CHEST 1 VIEW COMPARISON:  Chest radiograph performed 10/27/2013, and CT of the chest performed 11/15/2013  FINDINGS: The lungs are well-aerated. Minimal right basilar scarring is noted. Peribronchial thickening is seen. There is no evidence of pleural effusion or pneumothorax. The cardiomediastinal silhouette is within normal limits. No acute osseous abnormalities are seen. IMPRESSION: Minimal right basilar scarring.  Peribronchial thickening seen. Electronically Signed   By: Garald Balding M.D.   On: 10/16/2017 06:41    Hermelinda Dellen, DO  10/16/2017, 2:08 PM

## 2017-10-16 NOTE — ED Notes (Signed)
Provided bedpan to patient

## 2017-10-16 NOTE — Consult Note (Addendum)
Vonda Antigua, MD [address], [place], [place], [place], [address] [address], [place], [place], [place], [address] Phone: [phone]  Fax: [phone]  Consultation  Referring Provider:   Dr. Tressia Miners   Primary Care Physician:  Margo Common, [place] Primary Gastroenterologist:  Virgel Manifold, MD        Reason for Consultation:     Hematemesis  Date of Admission:  10/16/2017 Date of Consultation:  10/16/2017         HPI:   Caeley Dohrmann is a 83 y.o. female who presents with 3-4 episodes of hematemesis at home.  No previous history of the same.  Patient is on anticoagulation, Coumadin, due to peripheral vascular disease and stenting INR was 3.2 on presentation.  Not taking any NSAIDs.  No history of cirrhosis.  Patient had an EGD in 2013 for heartburn that showed a hiatal hernia and no other abnormality. patient has never had a colonoscopy.  No altered bowel habits or blood in stool.  No abdominal pain.  Has good appetite.  No weight loss  Past Medical History:  Diagnosis Date  . Angiopathy, peripheral ([place])   . Arterial embolus and thrombosis ([place])   . ASCVD (arteriosclerotic cardiovascular disease)    with stent RCA  . Bilateral bunions   . Cervical lymphadenitis   . Chronic gout of left ankle due to renal impairment without tophus   . Chronic infection June 2014   Has chronic infection of right femoral graft with history of extensive debridement in June 2014. Was on oral Clindamycin for 3 weeks for culture positive for anerobes and staph lugdunensis. Vascular Surgeon at [facility] Remonia Richter, MD) recommended indefinite suppressive therapy with Cephalexin 500 mg qd and reassessment every 3 months. Will refill Cephalexin.)  . Chronic kidney disease   . CRF (chronic renal failure)   . GERD (gastroesophageal reflux disease)   . Hiatal hernia   . History of arterial bypass of lower extremity   . Hyperlipidemia   . Hypertension   . Hyperuricemia   . Macular  degeneration   . Mass of right side of neck   . Nocturia   . OA (osteoarthritis)   . PVD (peripheral vascular disease) ([place])   . Renal disorder     Past Surgical History:  Procedure Laterality Date  . ABDOMINAL HYSTERECTOMY  1981  . arterial bypass right leg Right   . broken left foot Left 01/2002  . CHOLECYSTECTOMY    . CORONARY ANGIOPLASTY WITH STENT PLACEMENT    . stent in leg      Prior to Admission medications   Medication Sig Start Date End Date Taking? Authorizing Provider  amLODipine (NORVASC) 10 MG tablet TAKE 1 TABLET EVERY DAY 01/29/17  Yes Chrismon, Vickki Muff, PA  cephALEXin (KEFLEX) 500 MG capsule Take 1 capsule (500 mg total) by mouth daily. 05/31/17  Yes Chrismon, Vickki Muff, PA  furosemide (LASIX) 40 MG tablet Take 40 mg every other day by mouth.  04/02/15  Yes [provider]  losartan-hydrochlorothiazide (HYZAAR) 50-12.5 MG tablet TAKE 1 TABLET EVERY DAY 01/29/17  Yes Chrismon, Vickki Muff, PA  Multiple Vitamin (MULTIVITAMIN) capsule Take by mouth. Reported on 12/30/2015   Yes [provider]  Multiple Vitamins-Minerals (ICAPS AREDS 2 PO) Take by mouth.   Yes [provider]  pravastatin (PRAVACHOL) 20 MG tablet TAKE 1 TABLET AT BEDTIME 02/15/17  Yes Chrismon, Vickki Muff, PA  warfarin (COUMADIN) 5 MG tablet Take 5 mg by mouth daily  except 2.5 mg on Monday and Thursdays. 05/24/17  Yes Chrismon, Vickki Muff, PA  Acetaminophen (TYLENOL ARTHRITIS PAIN PO) Take by mouth.    [provider]  cholecalciferol (VITAMIN D) 1000 units tablet Take by mouth daily.    [provider]  diclofenac sodium (VOLTAREN) 1 % GEL Apply 2 g topically 4 (four) times daily. Patient not taking: Reported on 10/16/2017 10/23/16   Chrismon, Vickki Muff, PA  hydrocortisone valerate cream (WESTCORT) 0.2 %  08/26/16   [provider]  ranitidine (ZANTAC) 150 MG tablet Take 150 mg by mouth 2 (two) times daily.    [provider]    Family History  Problem  Relation Age of Onset  . Heart disease Mother   . Heart disease Father      Social History   Tobacco Use  . Smoking status: Former Smoker    Types: Cigarettes  . Smokeless tobacco: Never Used  . Tobacco comment: quit >30-40 years ago  Substance Use Topics  . Alcohol use: No  . Drug use: No    Allergies as of 10/16/2017 - Review Complete 10/16/2017  Allergen Reaction Noted  . Pletal [cilostazol] Swelling 03/31/2015  . Erythromycin Hives 03/31/2015  . Codeine Nausea Only and Nausea And Vomiting 03/31/2015  . Erythromycin base Itching and Rash 05/06/2015  . Penicillins Hives, Itching, and Rash 03/31/2015  . Sulfa antibiotics Rash 03/31/2015    Review of Systems:    All systems reviewed and negative except where noted in HPI.   Physical Exam:  Vital signs in last 24 hours: Temp:  [98 F (36.7 C)-98.8 F (37.1 C)] 98.8 F (37.1 C) (11/17 1529) Pulse Rate:  [46-76] 75 (11/17 1700) Resp:  [13-23] 20 (11/17 1700) BP: (112-197)/(33-103) 145/46 (11/17 1700) SpO2:  [90 %-100 %] 90 % (11/17 1700) Weight:  [78.9 kg (174 lb)-84.2 kg (185 lb 10 oz)] 84.2 kg (185 lb 10 oz) (11/17 1237) Last BM Date: 10/15/17 General:   Pleasant, cooperative in NAD Head:  Normocephalic and atraumatic. Eyes:   No icterus.   Conjunctiva pink. PERRLA. Ears:  Normal auditory acuity. Neck:  Supple; no masses or thyroidomegaly Lungs: Respirations even and unlabored. Lungs clear to auscultation bilaterally.   No wheezes, crackles, or rhonchi.  Heart:  Regular rate and rhythm;  Without murmur, clicks, rubs or gallops Abdomen:  Soft, nondistended, nontender. Normal bowel sounds. No appreciable masses or hepatomegaly.  No rebound or guarding.  Neurologic:  Alert and oriented x3;  grossly normal neurologically. Skin:  Intact without significant lesions or rashes. Cervical Nodes:  No significant cervical adenopathy. Psych:  Alert and cooperative. Normal affect.  LAB RESULTS: Recent Labs    10/16/17 0604  10/16/17 1256  WBC 9.9  --   HGB 8.4* 7.0*  HCT 26.3*  --   PLT 219  --    BMET Recent Labs    10/16/17 0604  NA 137  K 4.1  CL 104  CO2 24  GLUCOSE 146*  BUN 49*  CREATININE 1.96*  CALCIUM 9.4   LFT Recent Labs    10/16/17 0604  PROT 5.7*  ALBUMIN 2.8*  AST 24  ALT 12*  ALKPHOS 46  BILITOT 0.9   PT/INR Recent Labs    10/16/17 0604  LABPROT 32.7*  INR 3.22    STUDIES: Dg Chest Port 1 View  Result Date: 10/16/2017 CLINICAL DATA:  Acute onset of vomiting.  Hematemesis. EXAM: PORTABLE CHEST 1 VIEW COMPARISON:  Chest radiograph performed 10/27/2013, and CT of  the chest performed 11/15/2013 FINDINGS: The lungs are well-aerated. Minimal right basilar scarring is noted. Peribronchial thickening is seen. There is no evidence of pleural effusion or pneumothorax. The cardiomediastinal silhouette is within normal limits. No acute osseous abnormalities are seen. IMPRESSION: Minimal right basilar scarring.  Peribronchial thickening seen. Electronically Signed   By: Garald Balding M.D.   On: 10/16/2017 06:41      Impression / Plan:   Dorthia Tout is a 83 y.o. y/o female with history of peripheral vascular disease and stenting and is on Coumadin, presents with hematemesis and supratherapeutic INR  Upper GI bleed due to elevated INR Differential includes peptic ulcer disease, esophagitis Patient has not had any episodes of hematemesis since in the hospital No signs of active GI bleeding at this time Patient will need an EGD to evaluate the source for bleeding.  However, she will need further medical optimization to do the procedure safely.  This can be done in the next 1-2 days depending on clinical status and cardiac clearance. Please correct INR with vitamin K, FFP for a goal of INR less than 1.5 Continue serial CBCs and transfuse as needed.  Her troponin is mildly elevated and she complained of chest pain on presentation.  Please obtain cardiology consult for clearance for  the procedure  Continue Protonix 40 mg IV twice daily or Protonix drip Avoid NSAIDs Maintain 2 large bore IV lines Correct any electrolyte abnormalities  Please page GI with any signs of active GI bleeding to evaluate for need for emergent endoscopy at that time   Thank you for involving me in the care of this patient.      LOS: 0 days   Virgel Manifold, MD  10/16/2017, 5:40 PM

## 2017-10-16 NOTE — Progress Notes (Signed)
Pt started complaining of mild chest burning "5/10". Hemoglobin level came back at 7.0 and troponin level came back at 0.04. Dr. Jefferson Fuel notified of the above. Orders to obtain EKG and administer 1 unit of PRBC received.

## 2017-10-16 NOTE — H&P (Addendum)
[facility] [place] NAME: Lori Johnson    MR#:  [id number]  DATE OF BIRTH:  1930/05/16  DATE OF ADMISSION:  10/16/2017  PRIMARY CARE PHYSICIAN: Margo Common, PA   REQUESTING/REFERRING PHYSICIAN: Dr. Lurline Hare  CHIEF COMPLAINT:   Chief Complaint  Patient presents with  . GI Bleeding    HISTORY OF PRESENT ILLNESS:  Lori Johnson  is a 83 y.o. female with a known history of CAD status post stent, peripheral arterial disease status post bypass surgery after an arterial clot and on Coumadin, chronic gout, CKD, hypertension, arthritis, hyperlipidemia, GERD presents to hospital secondary to several episodes of hematemesis that started early this morning. Patient lives with her daughter, and she has been normal all day yesterday. Ate her supper around 6 PM last night. Denied any abdominal pain, nausea or vomiting. Hasn't had any dark stools or blood in stools recently. Last upper endoscopy was few years ago according to daughter. Around 5 AM this morning patient woke up not feeling well, felt like she was going to pass out. Then she complained of nausea and threw up a cup full of blood which was mostly fresh and some dark blood in it. After that she had 3 more of those episodes just less quantity. He is the hemoglobin is at 8.4, baseline around 11-12. Denies any active chest pain, nausea or vomiting. No fevers or chills. Did not eat anything outside yesterday. No recent travel. INR is at 3.2 today. BP is soft and improving with fluids.  PAST MEDICAL HISTORY:   Past Medical History:  Diagnosis Date  . Angiopathy, peripheral ([place])   . Arterial embolus and thrombosis ([place])   . ASCVD (arteriosclerotic cardiovascular disease)    with stent RCA  . Bilateral bunions   . Cervical lymphadenitis   . Chronic gout of left ankle due to renal impairment without tophus   . Chronic infection June 2014   Has chronic infection of right femoral graft with  history of extensive debridement in June 2014. Was on oral Clindamycin for 3 weeks for culture positive for anerobes and staph lugdunensis. Vascular Surgeon at [facility] Remonia Richter, MD) recommended indefinite suppressive therapy with Cephalexin 500 mg qd and reassessment every 3 months. Will refill Cephalexin.)  . Chronic kidney disease   . CRF (chronic renal failure)   . GERD (gastroesophageal reflux disease)   . Hiatal hernia   . History of arterial bypass of lower extremity   . Hyperlipidemia   . Hypertension   . Hyperuricemia   . Macular degeneration   . Mass of right side of neck   . Nocturia   . OA (osteoarthritis)   . PVD (peripheral vascular disease) ([place])   . Renal disorder     PAST SURGICAL HISTORY:   Past Surgical History:  Procedure Laterality Date  . ABDOMINAL HYSTERECTOMY  1981  . arterial bypass right leg Right   . broken left foot Left 01/2002  . CHOLECYSTECTOMY    . CORONARY ANGIOPLASTY WITH STENT PLACEMENT    . stent in leg      SOCIAL HISTORY:   Social History   Tobacco Use  . Smoking status: Former Smoker    Types: Cigarettes  . Smokeless tobacco: Never Used  . Tobacco comment: quit >30-40 years ago  Substance Use Topics  . Alcohol use: No    FAMILY HISTORY:   Family History  Problem Relation Age of Onset  . Heart disease Mother   .  Heart disease Father     DRUG ALLERGIES:   Allergies  Allergen Reactions  . Pletal [Cilostazol] Swelling  . Erythromycin Hives  . Codeine Nausea Only and Nausea And Vomiting  . Erythromycin Base Itching and Rash  . Penicillins Hives, Itching and Rash  . Sulfa Antibiotics Rash    REVIEW OF SYSTEMS:   Review of Systems  Constitutional: Negative for chills, fever, malaise/fatigue and weight loss.  HENT: Negative for ear discharge, ear pain, hearing loss, nosebleeds and tinnitus.   Eyes: Negative for blurred vision, double vision and photophobia.  Respiratory: Negative for cough, hemoptysis, shortness of  breath and wheezing.   Cardiovascular: Negative for chest pain, palpitations, orthopnea and leg swelling.  Gastrointestinal: Positive for nausea and vomiting. Negative for abdominal pain, constipation, diarrhea, heartburn and melena.       Hematemesis  Genitourinary: Negative for dysuria, frequency, hematuria and urgency.  Musculoskeletal: Negative for back pain, myalgias and neck pain.  Skin: Negative for rash.  Neurological: Negative for dizziness, tingling, tremors, sensory change, speech change, focal weakness and headaches.  Endo/Heme/Allergies: Does not bruise/bleed easily.  Psychiatric/Behavioral: Negative for depression.    MEDICATIONS AT HOME:   Prior to Admission medications   Medication Sig Start Date End Date Taking? Authorizing Provider  Acetaminophen (TYLENOL ARTHRITIS PAIN PO) Take by mouth.    [provider]  albuterol (PROAIR HFA) 108 (90 BASE) MCG/ACT inhaler Inhale into the lungs. 07/05/14   [provider]  albuterol (PROVENTIL HFA;VENTOLIN HFA) 108 (90 BASE) MCG/ACT inhaler Inhale into the lungs. 01/22/13   [provider]  amLODipine (NORVASC) 10 MG tablet TAKE 1 TABLET EVERY DAY 01/29/17   Chrismon, Vickki Muff, PA  Calcium Carb-Cholecalciferol (CALCIUM + D3 PO) Take by mouth.    [provider]  cephALEXin (KEFLEX) 500 MG capsule Take 1 capsule (500 mg total) by mouth daily. 05/31/17   Chrismon, Vickki Muff, PA  cholecalciferol (VITAMIN D) 1000 units tablet Take by mouth daily.    [provider]  diclofenac sodium (VOLTAREN) 1 % GEL Apply 2 g topically 4 (four) times daily. Patient taking differently: Apply 2 g topically 4 (four) times daily.  10/23/16   Chrismon, Vickki Muff, PA  furosemide (LASIX) 40 MG tablet Take 40 mg by mouth.  04/02/15   [provider]  hydrocortisone valerate cream (WESTCORT) 0.2 %  08/26/16   [provider]  lansoprazole (PREVACID) 30 MG capsule Take 30 mg by mouth. 01/22/13   [provider]  loratadine (CLARITIN) 10 MG tablet Take 10 mg by mouth. Reported on 12/30/2015 01/22/13   [provider]  losartan-hydrochlorothiazide (HYZAAR) 50-12.5 MG tablet TAKE 1 TABLET EVERY DAY 01/29/17   Chrismon, Vickki Muff, PA  mometasone-formoterol (DULERA) 100-5 MCG/ACT AERO Inhale into the lungs.     [provider]  Multiple Vitamin (MULTIVITAMIN) capsule Take by mouth. Reported on 12/30/2015    [provider]  Multiple Vitamins-Minerals (ICAPS AREDS 2 PO) Take by mouth.    [provider]  nitroGLYCERIN (NITROSTAT) 0.4 MG SL tablet Place 0.4 mg under the tongue.    [provider]  pravastatin (PRAVACHOL) 20 MG tablet TAKE 1 TABLET AT BEDTIME 02/15/17   Chrismon, Vickki Muff, PA  ranitidine (ZANTAC) 150 MG tablet Take 150 mg by mouth 2 (two) times daily.    [provider]  warfarin (COUMADIN) 5 MG tablet Take 5 mg by mouth daily except 2.5 mg on Monday and Thursdays. 05/24/17   Chrismon, Vickki Muff, PA  VITAL SIGNS:  Blood pressure (!) 112/42, pulse (!) 46, temperature 98 F (36.7 C), temperature source Oral, resp. rate 13, height 5\' 2"  (1.575 m), weight 78.9 kg (174 lb), SpO2 91 %.  PHYSICAL EXAMINATION:   Physical Exam  GENERAL:  83 y.o.-year-old patient lying in the bed with no acute distress.  EYES: Pupils equal, round, reactive to light and accommodation. No scleral icterus. Extraocular muscles intact.  HEENT: Head atraumatic, normocephalic. Oropharynx and nasopharynx clear.  NECK:  Supple, no jugular venous distention. No thyroid enlargement, no tenderness.  LUNGS: Normal breath sounds bilaterally, no wheezing, rales,rhonchi or crepitation. No use of accessory muscles of respiration.  CARDIOVASCULAR: S1, S2 normal. No  rubs, or gallops. 3/6 systolic murmur present ABDOMEN: Soft, nontender, nondistended. Bowel sounds present. No organomegaly or mass.  EXTREMITIES: No pedal edema, cyanosis, or clubbing.  NEUROLOGIC:  Cranial nerves II through XII are intact. Muscle strength 5/5 in all extremities. Sensation intact. Gait not checked.  PSYCHIATRIC: The patient is alert and oriented x 3.  SKIN: No obvious rash, lesion, or ulcer.   LABORATORY PANEL:   CBC Recent Labs  Lab 10/16/17 0604  WBC 9.9  HGB 8.4*  HCT 26.3*  PLT 219   ------------------------------------------------------------------------------------------------------------------  Chemistries  Recent Labs  Lab 10/16/17 0604  NA 137  K 4.1  CL 104  CO2 24  GLUCOSE 146*  BUN 49*  CREATININE 1.96*  CALCIUM 9.4  AST 24  ALT 12*  ALKPHOS 46  BILITOT 0.9   ------------------------------------------------------------------------------------------------------------------  Cardiac Enzymes Recent Labs  Lab 10/16/17 0604  TROPONINI 0.03*   ------------------------------------------------------------------------------------------------------------------  RADIOLOGY:  Dg Chest Port 1 View  Result Date: 10/16/2017 CLINICAL DATA:  Acute onset of vomiting.  Hematemesis. EXAM: PORTABLE CHEST 1 VIEW COMPARISON:  Chest radiograph performed 10/27/2013, and CT of the chest performed 11/15/2013 FINDINGS: The lungs are well-aerated. Minimal right basilar scarring is noted. Peribronchial thickening is seen. There is no evidence of pleural effusion or pneumothorax. The cardiomediastinal silhouette is within normal limits. No acute osseous abnormalities are seen. IMPRESSION: Minimal right basilar scarring.  Peribronchial thickening seen. Electronically Signed   By: Garald Balding M.D.   On: 10/16/2017 06:41    EKG:   Orders placed or performed during the hospital encounter of 10/16/17  . ED EKG  . ED EKG  . EKG 12-Lead  . EKG 12-Lead    IMPRESSION AND PLAN:   Lori Johnson  is a 83 y.o. female with a known history of CAD status post stent, peripheral arterial disease status post bypass surgery after an arterial clot and on Coumadin, chronic  gout, CKD, hypertension, arthritis, hyperlipidemia, GERD presents to hospital secondary to several episodes of hematemesis that started early this morning.  #1 Upper GI bleed- hematemesis 4 episodes -We will admit to stepdown, monitor hemoglobin -Continue Protonix drip. GI consult -No active bleeding at this time -coumadin will be held and FFP ordered  #2 acute blood loss anemia-drop in hemoglobin noted from 12-8 -Monitor hemoglobin every 8 hours and transfuse if hemodynamically unstable or hemoglobin less than 7. -Hold Coumadin. FFP ordered to reverse the INR  #3 peripheral vascular disease-had arterial thrombus on Coumadin-about 4 years ago -Since then has had bypass of lower extremities and decent arterial duplex on last year exam. -We will hold Coumadin for now. Follow up with vascular as outpatient  #4 hypertension-hold blood pressure medications due to acute bleed and hypotension at this time  #5 DVT prophylaxis-Ted's and SCDs only at this time  #6  CK D stage IV-stable. Baseline creatinine seems to be around 2    All the records are reviewed and case discussed with ED provider. Management plans discussed with the patient, family and they are in agreement.  CODE STATUS: Full Code  TOTAL CRITICAL CARE TIME SPENT IN  TAKING CARE OF THIS PATIENT: 58 minutes.    Gladstone Lighter M.D on 10/16/2017 at 9:05 AM  Between 7am to 6pm - Pager - [phone]  After 6pm go to [email or web address] - password [email or web address] [facility]  Office  [phone]  CC: Primary care physician; Chrismon, Vickki Muff, PA

## 2017-10-16 NOTE — ED Notes (Signed)
Date and time results received: 10/16/17 0645 (use smartphrase ".now" to insert current time)  Test:Troponin Critical Value: 0.03  Name of Provider Notified: Dr Beather Arbour  Orders Received? Or Actions Taken?: admit plan

## 2017-10-16 NOTE — ED Provider Notes (Signed)
[facility] Provider Note   ____________________________________________   First MD Initiated Contact with Patient 10/16/17 [phone]     (approximate)  I have reviewed the triage vital signs and the nursing notes.   HISTORY  Chief Complaint Hematemesis  Level V caveat: limited by distress  HPI Lori Johnson is a 83 y.o. female brought to the ED from home via EMS with a chief complaint of hematemesis.  Patient is on Coumadin; awoke in the night and vomited blood.  EMS reports large bloody emesis with blood clots.  Systolic blood pressure initially 110s and dipped down to the 70s.  Patient denies chest pain, shortness of breath, abdominal pain, lightheadedness.   Past Medical History:  Diagnosis Date  . Angiopathy, peripheral ()   . ASCVD (arteriosclerotic cardiovascular disease)    with stent RCA  . Bilateral bunions   . Cervical lymphadenitis   . Chronic gout of left ankle due to renal impairment without tophus   . Chronic infection June 2014   Has chronic infection of right femoral graft with history of extensive debridement in June 2014. Was on oral Clindamycin for 3 weeks for culture positive for anerobes and staph lugdunensis. Vascular Surgeon at [facility] Remonia Richter, MD) recommended indefinite suppressive therapy with Cephalexin 500 mg qd and reassessment every 3 months. Will refill Cephalexin.)  . Chronic kidney disease   . CRF (chronic renal failure)   . GERD (gastroesophageal reflux disease)   . Hiatal hernia   . History of arterial bypass of lower extremity   . History of DVT (deep vein thrombosis)   . Hyperlipidemia   . Hypertension   . Hyperuricemia   . Macular degeneration   . Mass of right side of neck   . Nocturia   . OA (osteoarthritis)   . PVD (peripheral vascular disease) ([place])   . Renal disorder     Patient Active Problem List   Diagnosis Date Noted  . Closed fracture of tibial plateau 06/24/2015  . Current  tear knee, medial meniscus 06/24/2015  . Arthritis of knee, degenerative 06/24/2015  . Angiopathy, peripheral ([place]) 06/06/2015  . Arterial vascular disease 06/06/2015  . Bunion 06/06/2015  . Cervical adenitis 06/06/2015  . Gout due to renal impairment of multiple sites 06/06/2015  . Chronic infection 06/06/2015  . Contusion of forearm 06/06/2015  . Arm bruise 06/06/2015  . Asthma, cough variant 06/06/2015  . Gastroesophageal reflux disease with hiatal hernia 06/06/2015  . History of DVT of lower extremity 06/06/2015  . Bergmann's syndrome 06/06/2015  . H/O arterial bypass of lower limb 06/06/2015  . HLD (hyperlipidemia) 06/06/2015  . Benign hypertension 06/06/2015  . Elevated blood uric acid level 06/06/2015  . Disorder of kidney 06/06/2015  . Lump in neck 06/06/2015  . Excessive urination at night 06/06/2015  . Arthritis, degenerative 06/06/2015  . Poor balance 06/06/2015  . Peripheral blood vessel disorder ([place]) 06/06/2015  . Acid reflux 03/08/2013  . Atherosclerosis of native artery of extremity ([place]) 01/22/2013  . Chronic kidney disease 01/22/2013  . CAD in native artery 01/22/2013  . Essential (primary) hypertension 01/22/2013  . Atherosclerosis of native arteries of extremity with rest pain ([place]) 01/22/2013  . Arthralgia of hip or thigh 03/29/2006    Past Surgical History:  Procedure Laterality Date  . ABDOMINAL HYSTERECTOMY  1981  . arterial bypass right leg Right   . broken left foot Left 01/2002  . CHOLECYSTECTOMY    . stent in leg  Prior to Admission medications   Medication Sig Start Date End Date Taking? Authorizing Provider  Acetaminophen (TYLENOL ARTHRITIS PAIN PO) Take by mouth.    [provider]  albuterol (PROAIR HFA) 108 (90 BASE) MCG/ACT inhaler Inhale into the lungs. 07/05/14   [provider]  albuterol (PROVENTIL HFA;VENTOLIN HFA) 108 (90 BASE) MCG/ACT inhaler Inhale into the lungs. 01/22/13   [provider]    amLODipine (NORVASC) 10 MG tablet TAKE 1 TABLET EVERY DAY 01/29/17   Chrismon, Vickki Muff, PA  Calcium Carb-Cholecalciferol (CALCIUM + D3 PO) Take by mouth.    [provider]  cephALEXin (KEFLEX) 500 MG capsule Take 1 capsule (500 mg total) by mouth daily. 05/31/17   Chrismon, Vickki Muff, PA  cholecalciferol (VITAMIN D) 1000 units tablet Take by mouth daily.    [provider]  diclofenac sodium (VOLTAREN) 1 % GEL Apply 2 g topically 4 (four) times daily. Patient taking differently: Apply 2 g topically 4 (four) times daily.  10/23/16   Chrismon, Vickki Muff, PA  furosemide (LASIX) 40 MG tablet Take 40 mg by mouth.  04/02/15   [provider]  hydrocortisone valerate cream (WESTCORT) 0.2 %  08/26/16   [provider]  lansoprazole (PREVACID) 30 MG capsule Take 30 mg by mouth. 01/22/13   [provider]  loratadine (CLARITIN) 10 MG tablet Take 10 mg by mouth. Reported on 12/30/2015 01/22/13   [provider]  losartan-hydrochlorothiazide (HYZAAR) 50-12.5 MG tablet TAKE 1 TABLET EVERY DAY 01/29/17   Chrismon, Vickki Muff, PA  mometasone-formoterol (DULERA) 100-5 MCG/ACT AERO Inhale into the lungs.     [provider]  Multiple Vitamin (MULTIVITAMIN) capsule Take by mouth. Reported on 12/30/2015    [provider]  Multiple Vitamins-Minerals (ICAPS AREDS 2 PO) Take by mouth.    [provider]  nitroGLYCERIN (NITROSTAT) 0.4 MG SL tablet Place 0.4 mg under the tongue.    [provider]  pravastatin (PRAVACHOL) 20 MG tablet TAKE 1 TABLET AT BEDTIME 02/15/17   Chrismon, Vickki Muff, PA  ranitidine (ZANTAC) 150 MG tablet Take 150 mg by mouth 2 (two) times daily.    [provider]  warfarin (COUMADIN) 5 MG tablet Take 5 mg by mouth daily except 2.5 mg on Monday and Thursdays. 05/24/17   Chrismon, Vickki Muff, PA    Allergies Pletal [cilostazol]; Erythromycin; Codeine; Erythromycin base; Penicillins; and Sulfa antibiotics  Family  History  Problem Relation Age of Onset  . Heart disease Mother   . Heart disease Father     Social History Social History   Tobacco Use  . Smoking status: Former Smoker    Types: Cigarettes  . Smokeless tobacco: Never Used  . Tobacco comment: quit >30-40 years ago  Substance Use Topics  . Alcohol use: No  . Drug use: No    Review of Systems   Constitutional: No fever/chills. Eyes: No visual changes. ENT: No sore throat. Cardiovascular: Denies chest pain. Respiratory: Denies shortness of breath. Gastrointestinal: No abdominal pain.  Positive for vomiting blood.  No diarrhea.  No constipation. Genitourinary: Negative for dysuria. Musculoskeletal: Negative for back pain. Skin: Negative for rash. Neurological: Negative for headaches, focal weakness or numbness.   ____________________________________________   PHYSICAL EXAM:  VITAL SIGNS: ED Triage Vitals  Enc Vitals Group     BP      Pulse      Resp      Temp      Temp src  SpO2      Weight      Height      Head Circumference      Peak Flow      Pain Score      Pain Loc      Pain Edu?      Excl. in [place]?      Constitutional: Alert and oriented. Ill appearing and in mild acute distress. Eyes: Conjunctivae are normal. PERRL. EOMI. Head: Atraumatic. Nose: No congestion/rhinnorhea. Mouth/Throat: Evidence of bloody emesis with blood clots.   Neck: No stridor.   Cardiovascular: Normal rate, regular rhythm. Grossly normal heart sounds.  Good peripheral circulation. Respiratory: Normal respiratory effort.  No retractions. Lungs CTAB. Gastrointestinal: Soft and nontender. No distention. No abdominal bruits. No CVA tenderness. Musculoskeletal: No lower extremity tenderness nor edema.  No joint effusions. Neurologic:  Normal speech and language. No gross focal neurologic deficits are appreciated.  Skin:  Skin is warm, dry and intact. No rash noted. Psychiatric: Mood and affect are normal. Speech and behavior  are normal.  ____________________________________________   LABS (all labs ordered are listed, but only abnormal results are displayed)  Labs Reviewed  CBC WITH DIFFERENTIAL/PLATELET - Abnormal; Notable for the following components:      Result Value   RBC 2.76 (*)    Hemoglobin 8.4 (*)    HCT 26.3 (*)    MCHC 31.8 (*)    All other components within normal limits  COMPREHENSIVE METABOLIC PANEL - Abnormal; Notable for the following components:   Glucose, Bld 146 (*)    BUN 49 (*)    Creatinine, Ser 1.96 (*)    Total Protein 5.7 (*)    Albumin 2.8 (*)    ALT 12 (*)    GFR calc non Af Amer 22 (*)    GFR calc Af Amer 26 (*)    All other components within normal limits  PROTIME-INR - Abnormal; Notable for the following components:   Prothrombin Time 32.7 (*)    All other components within normal limits  TROPONIN I - Abnormal; Notable for the following components:   Troponin I 0.03 (*)    All other components within normal limits  LIPASE, BLOOD  TYPE AND SCREEN   ____________________________________________  EKG  ED ECG REPORT I, SUNG,JADE J, the attending physician, personally viewed and interpreted this ECG.   Date: 10/16/2017  EKG Time: 0613  Rate: 59  Rhythm: normal EKG, normal sinus rhythm  Axis: Normal  Intervals:none  ST&T Change: Nonspecific  ____________________________________________  RADIOLOGY  Dg Chest Port 1 View  Result Date: 10/16/2017 CLINICAL DATA:  Acute onset of vomiting.  Hematemesis. EXAM: PORTABLE CHEST 1 VIEW COMPARISON:  Chest radiograph performed 10/27/2013, and CT of the chest performed 11/15/2013 FINDINGS: The lungs are well-aerated. Minimal right basilar scarring is noted. Peribronchial thickening is seen. There is no evidence of pleural effusion or pneumothorax. The cardiomediastinal silhouette is within normal limits. No acute osseous abnormalities are seen. IMPRESSION: Minimal right basilar scarring.  Peribronchial thickening seen.  Electronically Signed   By: Garald Balding M.D.   On: 10/16/2017 06:41    ____________________________________________   PROCEDURES  Procedure(s) performed:   Rectal exam: External exam within normal limits without external hemorrhoids.  Tan stool on gloved finger which is immediately heme positive.  Procedures  Critical Care performed: Yes, see critical care note(s)   CRITICAL CARE Performed by: Paulette Blanch   Total critical care time: 45 minutes  Critical care time was exclusive of separately  billable procedures and treating other patients.  Critical care was necessary to treat or prevent imminent or life-threatening deterioration.  Critical care was time spent personally by me on the following activities: development of treatment plan with patient and/or surrogate as well as nursing, discussions with consultants, evaluation of patient's response to treatment, examination of patient, obtaining history from patient or surrogate, ordering and performing treatments and interventions, ordering and review of laboratory studies, ordering and review of radiographic studies, pulse oximetry and re-evaluation of patient's condition.  ____________________________________________   INITIAL IMPRESSION / ASSESSMENT AND PLAN / ED COURSE  As part of my medical decision making, I reviewed the following data within the [place] History obtained from family, Nursing notes reviewed and incorporated, Labs reviewed, EKG interpreted, Old chart reviewed, Discussed with radiologist and Notes from prior ED visits.   83 year old female on Coumadin who presents with hematemesis.  No prior history of GI bleed.  Differential diagnosis includes but is not limited to upper GI bleed, esophageal varices, Mallory-Weiss tear.  Blood pressure currently 159/46.  Will place 2 large-bore peripheral IVs, obtain screening lab work including INR, type and screen, initiate IV fluid resuscitation.   Anticipate hospitalization.  Clinical Course as of Oct 16 708  Sat Oct 16, 2017  0707 Patient was admitted to the hospitalist service (Dr. Marcille Blanco). Remains hemodynamically stable. Will hold off pRBC and FFP at this time although I suspect patient would require pRBCs at a later point.  [JS]    Clinical Course User Index [JS] Paulette Blanch, MD     ____________________________________________   FINAL CLINICAL IMPRESSION(S) / ED DIAGNOSES  Final diagnoses:  Hematemesis with nausea  Gastrointestinal hemorrhage, unspecified gastrointestinal hemorrhage type  Anemia, unspecified type     ED Discharge Orders    None       Note:  This document was prepared using Dragon voice recognition software and may include unintentional dictation errors.    Paulette Blanch, MD 10/16/17 [phone]

## 2017-10-16 NOTE — Progress Notes (Signed)
Tukov NP informed of increased troponin of 0.97.

## 2017-10-17 ENCOUNTER — Inpatient Hospital Stay
Admit: 2017-10-17 | Discharge: 2017-10-17 | Disposition: A | Discharge status: Home / Self Care | Payer: Medicare PPO | Attending: Internal Medicine | Admitting: Internal Medicine

## 2017-10-17 LAB — TYPE AND SCREEN
ABO/RH(D): O POS
Antibody Screen: NEGATIVE
Unit division: 0
Unit division: 0

## 2017-10-17 LAB — CBC
HEMATOCRIT: 27 % — AB (ref 35.0–47.0)
HEMATOCRIT: 26.8 % — AB (ref 35.0–47.0)
Hemoglobin: 9 g/dL — ABNORMAL LOW (ref 12.0–16.0)
Hemoglobin: 9.1 g/dL — ABNORMAL LOW (ref 12.0–16.0)
MCH: 29.9 pg (ref 26.0–34.0)
MCH: 30 pg (ref 26.0–34.0)
MCHC: 33.3 g/dL (ref 32.0–36.0)
MCHC: 33.8 g/dL (ref 32.0–36.0)
MCV: 89.7 fL (ref 80.0–100.0)
MCV: 88.7 fL (ref 80.0–100.0)
PLATELETS: 157 10*3/uL (ref 150–440)
Platelets: 139 10*3/uL — ABNORMAL LOW (ref 150–440)
RBC: 3.01 MIL/uL — ABNORMAL LOW (ref 3.80–5.20)
RBC: 3.03 MIL/uL — ABNORMAL LOW (ref 3.80–5.20)
RDW: 16.7 % — AB (ref 11.5–14.5)
RDW: 17.1 % — AB (ref 11.5–14.5)
WBC: 7.9 10*3/uL (ref 3.6–11.0)
WBC: 6.6 10*3/uL (ref 3.6–11.0)

## 2017-10-17 LAB — BASIC METABOLIC PANEL
Anion gap: 6 (ref 5–15)
BUN: 45 mg/dL — AB (ref 6–20)
CHLORIDE: 110 mmol/L (ref 101–111)
CO2: 23 mmol/L (ref 22–32)
CREATININE: 1.65 mg/dL — AB (ref 0.44–1.00)
Calcium: 8.6 mg/dL — ABNORMAL LOW (ref 8.9–10.3)
GFR calc non Af Amer: 27 mL/min — ABNORMAL LOW (ref >60)
GFR, EST AFRICAN AMERICAN: 32 mL/min — AB (ref >60)
GLUCOSE: 96 mg/dL (ref 65–99)
Potassium: 4.1 mmol/L (ref 3.5–5.1)
Sodium: 139 mmol/L (ref 135–145)

## 2017-10-17 LAB — BPAM RBC
BLOOD PRODUCT EXPIRATION DATE: 201812062359
Blood Product Expiration Date: 201811262359
ISSUE DATE / TIME: 201811171501
ISSUE DATE / TIME: 201811172222
UNIT TYPE AND RH: 5100
Unit Type and Rh: 9500

## 2017-10-17 LAB — BPAM FFP
BLOOD PRODUCT EXPIRATION DATE: 201811222359
BLOOD PRODUCT EXPIRATION DATE: 201811222359
ISSUE DATE / TIME: 201811170938
ISSUE DATE / TIME: 201811171312
UNIT TYPE AND RH: 5100
Unit Type and Rh: 5100

## 2017-10-17 LAB — PREPARE FRESH FROZEN PLASMA
UNIT DIVISION: 0
Unit division: 0

## 2017-10-17 LAB — PHOSPHORUS: PHOSPHORUS: 3 mg/dL (ref 2.5–4.6)

## 2017-10-17 LAB — HEMOGLOBIN
HEMOGLOBIN: 9.4 g/dL — AB (ref 12.0–16.0)
HEMOGLOBIN: 9.4 g/dL — AB (ref 12.0–16.0)

## 2017-10-17 LAB — TROPONIN I
Troponin I: 4.54 ng/mL (ref <0.03)
Troponin I: 4.39 ng/mL (ref <0.03)

## 2017-10-17 LAB — MAGNESIUM: MAGNESIUM: 1.5 mg/dL — AB (ref 1.7–2.4)

## 2017-10-17 MED ORDER — MAGNESIUM SULFATE 2 GM/50ML IV SOLN
2.0000 g | Freq: Once | INTRAVENOUS | Status: AC
  Administered 2017-10-17: 2 g via INTRAVENOUS
  Filled 2017-10-17: qty 50

## 2017-10-17 MED ORDER — AMLODIPINE BESYLATE 10 MG PO TABS
10.0000 mg | ORAL_TABLET | Freq: Every day | ORAL | Status: DC
  Administered 2017-10-17 – 2017-10-20 (×4): 10 mg via ORAL
  Filled 2017-10-17 (×4): qty 1

## 2017-10-17 MED ORDER — ZOLPIDEM TARTRATE 5 MG PO TABS
5.0000 mg | ORAL_TABLET | Freq: Once | ORAL | Status: AC
  Administered 2017-10-17: 5 mg via ORAL
  Filled 2017-10-17: qty 1

## 2017-10-17 NOTE — Progress Notes (Signed)
Lab called with critical troponin. Dr. Jefferson Fuel made aware. No new orders at this time.

## 2017-10-17 NOTE — Progress Notes (Signed)
Tukov NP notified of increased troponin of 4.54.

## 2017-10-17 NOTE — Progress Notes (Addendum)
[facility] Progess Note    SYNOPSIS   peripheral vascular disease, status post bilateral lower extremity stenting with residual thrombus in the right lower extremity, on chronic anticoagulation therapy, presented with upper GI bleed with elevated INR 3.22.     ASSESSMENT/PLAN   Gastrointestinal bleed. Hemoglobin is 9.1. Appreciate GIs input will wait to hear if she is going to have endoscopy. Presently on Protonix, has received packed red blood cell transfusion  Chest discomfort. EKG does not reveal any acute ST segment changes. Her troponin went from 0.04, 0.97, 4.94. We'll repeat value today. This may be secondary to her anemic state with supply demand ischemia. Has been transfused with hemoglobin of 9. Cannot give aspirin, cannot anticoagulate, blood pressure has been borderline so hesitant to give beta blocker, ACE/ARB. Will put in for echocardiogram, if any EKG changes we'll consult cardiology although limited therapeutics at this time  Renal insufficiency. Slowly improving with supporting hemodynamics  Name: Lori Johnson MRN: [id number] DOB: 09/20/1930    ADMISSION DATE:  10/16/2017  SUBJECTIVE:   Patient had no further episodes of hematemesis last night. Received additional unit of packed red blood cells. Presently hemodynamically stable denies any significant chest pain does complain of gastric reflux  VITAL SIGNS: Temp:  [97.9 F (36.6 C)-98.8 F (37.1 C)] 98.1 F (36.7 C) (11/18 0200) Pulse Rate:  [57-78] 61 (11/18 0600) Resp:  [14-25] 23 (11/18 0600) BP: (120-197)/(32-81) 153/48 (11/18 0600) SpO2:  [86 %-99 %] 94 % (11/18 0600) Weight:  [84.2 kg (185 lb 10 oz)] 84.2 kg (185 lb 10 oz) (11/17 1237)   PHYSICAL EXAMINATION: Physical Examination:   VS: BP (!) 153/48   Pulse 61   Temp 98.1 F (36.7 C) (Oral)   Resp (!) 23   Ht 5\' 4"  (1.626 m)   Wt 84.2 kg (185 lb 10 oz)   SpO2 94%   BMI 31.86 kg/m   General Appearance: No distress    Neuro:without focal findings, mental status normal. HEENT: PERRLA, EOM intact. Pulmonary: normal breath sounds   CardiovascularNormal S1,S2.  No m/r/g.   Abdomen: Benign, Soft, non-tender. Renal:  No costovertebral tenderness  GU:  Not performed at this time. Endocrine: No evident thyromegaly. Skin:   warm, no rashes, no ecchymosis  Extremities: normal, no cyanosis, clubbing.    LABORATORY PANEL:   CBC Recent Labs  Lab 10/17/17 0649  WBC 6.6  HGB 9.1*  HCT 26.8*  PLT 157    Chemistries  Recent Labs  Lab 10/16/17 0604 10/17/17 0513  NA 137 139  K 4.1 4.1  CL 104 110  CO2 24 23  GLUCOSE 146* 96  BUN 49* 45*  CREATININE 1.96* 1.65*  CALCIUM 9.4 8.6*  MG  --  1.5*  PHOS  --  3.0  AST 24  --   ALT 12*  --   ALKPHOS 46  --   BILITOT 0.9  --     No results for input(s): GLUCAP in the last 168 hours. No results for input(s): PHART, PCO2ART, PO2ART in the last 168 hours. Recent Labs  Lab 10/16/17 0604  AST 24  ALT 12*  ALKPHOS 46  BILITOT 0.9  ALBUMIN 2.8*    Cardiac Enzymes Recent Labs  Lab 10/17/17 0120  TROPONINI 4.54*    RADIOLOGY:  Dg Chest Port 1 View  Result Date: 10/16/2017 CLINICAL DATA:  Acute onset of vomiting.  Hematemesis. EXAM: PORTABLE CHEST 1 VIEW COMPARISON:  Chest radiograph performed 10/27/2013, and CT of the  chest performed 11/15/2013 FINDINGS: The lungs are well-aerated. Minimal right basilar scarring is noted. Peribronchial thickening is seen. There is no evidence of pleural effusion or pneumothorax. The cardiomediastinal silhouette is within normal limits. No acute osseous abnormalities are seen. IMPRESSION: Minimal right basilar scarring.  Peribronchial thickening seen. Electronically Signed   By: Garald Balding M.D.   On: 10/16/2017 06:41    10/17/2017

## 2017-10-17 NOTE — Progress Notes (Signed)
*  PRELIMINARY RESULTS* Echocardiogram 2D Echocardiogram has been performed.  Lori Johnson 10/17/2017, 11:47 AM

## 2017-10-17 NOTE — Progress Notes (Signed)
[facility] NAME: Lori Johnson    MR#:  [id number]  DATE OF BIRTH:  September 17, 1930  SUBJECTIVE:  CHIEF COMPLAINT:   Chief Complaint  Patient presents with  . GI Bleeding   - Came in after hematemesis and chest pain. -Received 1 unit packed RBC transfusion. Remains nothing by mouth. Troponins significantly elevated yesterday. -Denies any chest pain this morning  REVIEW OF SYSTEMS:  Review of Systems  Constitutional: Positive for malaise/fatigue. Negative for chills and fever.  HENT: Negative for congestion, ear discharge, hearing loss and nosebleeds.   Eyes: Negative for blurred vision and double vision.  Respiratory: Negative for cough, shortness of breath and wheezing.   Cardiovascular: Negative for chest pain and palpitations.  Gastrointestinal: Negative for abdominal pain, constipation, diarrhea, nausea and vomiting.  Genitourinary: Negative for dysuria.  Musculoskeletal: Negative for myalgias.  Neurological: Negative for dizziness, speech change, focal weakness, seizures and headaches.  Psychiatric/Behavioral: Negative for depression.    DRUG ALLERGIES:   Allergies  Allergen Reactions  . Pletal [Cilostazol] Swelling  . Erythromycin Hives  . Codeine Nausea Only and Nausea And Vomiting  . Erythromycin Base Itching and Rash  . Penicillins Hives, Itching and Rash  . Sulfa Antibiotics Rash    VITALS:  Blood pressure (!) 153/48, pulse 61, temperature 98.1 F (36.7 C), temperature source Oral, resp. rate (!) 23, height 5\' 4"  (1.626 m), weight 84.2 kg (185 lb 10 oz), SpO2 94 %.  PHYSICAL EXAMINATION:  Physical Exam  GENERAL:  83 y.o.-year-old pleasant patient lying in the bed with no acute distress.  EYES: Pupils equal, round, reactive to light and accommodation. No scleral icterus. Extraocular muscles intact.  HEENT: Head atraumatic, normocephalic. Oropharynx and nasopharynx clear.  NECK:  Supple, no jugular venous  distention. No thyroid enlargement, no tenderness.  LUNGS: Normal breath sounds bilaterally, no wheezing, rales,rhonchi or crepitation. No use of accessory muscles of respiration.  CARDIOVASCULAR: S1, S2 normal. No  rubs, or gallops. 3/6 systolic murmur present ABDOMEN: Soft, nontender, nondistended. Bowel sounds present. No organomegaly or mass.  EXTREMITIES: No pedal edema, cyanosis, or clubbing.  NEUROLOGIC: Cranial nerves II through XII are intact. Muscle strength 5/5 in all extremities. Sensation intact. Gait not checked.  PSYCHIATRIC: The patient is alert and oriented x 3.  SKIN: No obvious rash, lesion, or ulcer.      LABORATORY PANEL:   CBC Recent Labs  Lab 10/17/17 0649  WBC 6.6  HGB 9.1*  HCT 26.8*  PLT 157   ------------------------------------------------------------------------------------------------------------------  Chemistries  Recent Labs  Lab 10/16/17 0604 10/17/17 0513  NA 137 139  K 4.1 4.1  CL 104 110  CO2 24 23  GLUCOSE 146* 96  BUN 49* 45*  CREATININE 1.96* 1.65*  CALCIUM 9.4 8.6*  MG  --  1.5*  AST 24  --   ALT 12*  --   ALKPHOS 46  --   BILITOT 0.9  --    ------------------------------------------------------------------------------------------------------------------  Cardiac Enzymes Recent Labs  Lab 10/17/17 0120  TROPONINI 4.54*   ------------------------------------------------------------------------------------------------------------------  RADIOLOGY:  Dg Chest Port 1 View  Result Date: 10/16/2017 CLINICAL DATA:  Acute onset of vomiting.  Hematemesis. EXAM: PORTABLE CHEST 1 VIEW COMPARISON:  Chest radiograph performed 10/27/2013, and CT of the chest performed 11/15/2013 FINDINGS: The lungs are well-aerated. Minimal right basilar scarring is noted. Peribronchial thickening is seen. There is no evidence of pleural effusion or pneumothorax. The cardiomediastinal silhouette is within normal limits. No acute osseous  abnormalities  are seen. IMPRESSION: Minimal right basilar scarring.  Peribronchial thickening seen. Electronically Signed   By: Garald Balding M.D.   On: 10/16/2017 06:41    EKG:   Orders placed or performed during the hospital encounter of 10/16/17  . ED EKG  . ED EKG  . EKG 12-Lead  . EKG 12-Lead  . EKG 12-Lead  . EKG 12-Lead    ASSESSMENT AND PLAN:   Lori Johnson  is a 83 y.o. female with a known history of CAD status post stent, peripheral arterial disease status post bypass surgery after an arterial clot and on Coumadin, chronic gout, CKD, hypertension, arthritis, hyperlipidemia, GERD presents to hospital secondary to several episodes of hematemesis that started early this morning.  #1 Upper GI bleed- hematemesis 4 episodes -admitted to stepdown, drop in hemoglobin with 1 unit packed RBC transfusion received -Continue Protonix drip. GI consult is appreciated. Will need EGD once cleared by cardiology -Continue on clear except sips of water until then -No active bleeding at this time -coumadin will be held  #2 acute blood loss anemia-drop in hemoglobin noted from 12- 7 -Received 1 unit packed RBC chance fusion yesterday, Coumadin held. Hemoglobin is stable at 9. Monitor closely -Monitor hemoglobin and transfuse if hemodynamically unstable or hemoglobin less than 7. -Hold Coumadin. FFP given to reverse the INR, now at 1.3  #3 NSTEMI- troponin slightly elevated, patient also presented with chest pain. Could be demand ischemia from rapid drop in hemoglobin. -Denies any chest pain at this time. EKG without any acute changes -Echocardiogram ordered to check wall motion abnormalities. Also cardiology consulted to see if patient can have upper GI endoscopy. -Follow troponin levels - replace magnesium  #4 peripheral vascular disease-had arterial thrombus on Coumadin-about 4 years ago -Since then has had bypass of lower extremities and decent arterial duplex on last year exam. -We will hold  Coumadin for now. Follow up with vascular as outpatient  #5 hypertension-hold blood pressure medications due to acute bleed and hypotension at this time  #6 CK D stage IV-stable. Baseline creatinine seems to be around 2  #7 DVT prophylaxis-Ted's and SCDs only at this time    All the records are reviewed and case discussed with Care Management/Social Workerr. Management plans discussed with the patient, family and they are in agreement.  CODE STATUS: Full code  TOTAL TIME TAKING CARE OF THIS PATIENT: 38 minutes.   POSSIBLE D/C IN 2 DAYS, DEPENDING ON CLINICAL CONDITION.   Gladstone Lighter M.D on 10/17/2017 at 8:35 AM  Between 7am to 6pm - Pager - [phone]  After 6pm go to [email or web address] - password [email or web address] [facility]  Office  [phone]  CC: Primary care physician; Chrismon, Vickki Muff, PA

## 2017-10-17 NOTE — Consult Note (Signed)
Reason for Consult: Elevated troponin non-STEMI Referring Physician: ICU Dr Tressia Miners hospitalist, Fannie Knee primary Cardiologist [facility] Hogen is an 83 y.o. female.  HPI: Patient's a 83 year old female with extensive history of peripheral vascular disease fail incomplete peripheral vascular revascularization with thrombus had been on Coumadin arterial clot history of peripheral artery bypass presents with upper GI bleeding and profound anemia eventually elevated troponins to 4 denies any chest pain but has had reflux symptoms. Patient has been transfused and feels much improved denies any significant previous recent cardiac history. Patient has a daughter was doing well until she started having.stools and then bleeding up coughing up blood vomiting of blood which appeared to be fresh in rapid hemoglobin was found to be 8 baseline is usually around 11. Patient feels somewhat improved since aggressive supportive therapy  Past Medical History:  Diagnosis Date  . Angiopathy, peripheral ([place])   . Arterial embolus and thrombosis ([place])   . ASCVD (arteriosclerotic cardiovascular disease)    with stent RCA  . Bilateral bunions   . Cervical lymphadenitis   . Chronic gout of left ankle due to renal impairment without tophus   . Chronic infection June 2014   Has chronic infection of right femoral graft with history of extensive debridement in June 2014. Was on oral Clindamycin for 3 weeks for culture positive for anerobes and staph lugdunensis. Vascular Surgeon at [facility] Remonia Richter, MD) recommended indefinite suppressive therapy with Cephalexin 500 mg qd and reassessment every 3 months. Will refill Cephalexin.)  . Chronic kidney disease   . CRF (chronic renal failure)   . GERD (gastroesophageal reflux disease)   . Hiatal hernia   . History of arterial bypass of lower extremity   . Hyperlipidemia   . Hypertension   . Hyperuricemia   . Macular degeneration   . Mass of right side of neck   .  Nocturia   . OA (osteoarthritis)   . PVD (peripheral vascular disease) ([place])   . Renal disorder     Past Surgical History:  Procedure Laterality Date  . ABDOMINAL HYSTERECTOMY  1981  . arterial bypass right leg Right   . broken left foot Left 01/2002  . CHOLECYSTECTOMY    . CORONARY ANGIOPLASTY WITH STENT PLACEMENT    . stent in leg      Family History  Problem Relation Age of Onset  . Heart disease Mother   . Heart disease Father     Social History:  reports that she has quit smoking. Her smoking use included cigarettes. she has never used smokeless tobacco. She reports that she does not drink alcohol or use drugs.  Allergies:  Allergies  Allergen Reactions  . Pletal [Cilostazol] Swelling  . Erythromycin Hives  . Codeine Nausea Only and Nausea And Vomiting  . Erythromycin Base Itching and Rash  . Penicillins Hives, Itching and Rash  . Sulfa Antibiotics Rash    Medications: I have reviewed the patient's current medications.  Results for orders placed or performed during the hospital encounter of 10/16/17 (from the past 48 hour(s))  CBC with Differential     Status: Abnormal   Collection Time: 10/16/17  6:04 AM  Result Value Ref Range   WBC 9.9 3.6 - 11.0 K/uL   RBC 2.76 (L) 3.80 - 5.20 MIL/uL   Hemoglobin 8.4 (L) 12.0 - 16.0 g/dL   HCT 26.3 (L) 35.0 - 47.0 %   MCV 95.4 80.0 - 100.0 fL   MCH 30.4 26.0 - 34.0 pg  MCHC 31.8 (L) 32.0 - 36.0 g/dL   RDW 13.1 11.5 - 14.5 %   Platelets 219 150 - 440 K/uL   Neutrophils Relative % 64 %   Neutro Abs 6.3 1.4 - 6.5 K/uL   Lymphocytes Relative 24 %   Lymphs Abs 2.4 1.0 - 3.6 K/uL   Monocytes Relative 9 %   Monocytes Absolute 0.9 0.2 - 0.9 K/uL   Eosinophils Relative 3 %   Eosinophils Absolute 0.3 0 - 0.7 K/uL   Basophils Relative 0 %   Basophils Absolute 0.0 0 - 0.1 K/uL  Comprehensive metabolic panel     Status: Abnormal   Collection Time: 10/16/17  6:04 AM  Result Value Ref Range   Sodium 137 135 - 145 mmol/L    Potassium 4.1 3.5 - 5.1 mmol/L   Chloride 104 101 - 111 mmol/L   CO2 24 22 - 32 mmol/L   Glucose, Bld 146 (H) 65 - 99 mg/dL   BUN 49 (H) 6 - 20 mg/dL   Creatinine, Ser 1.96 (H) 0.44 - 1.00 mg/dL   Calcium 9.4 8.9 - 10.3 mg/dL   Total Protein 5.7 (L) 6.5 - 8.1 g/dL   Albumin 2.8 (L) 3.5 - 5.0 g/dL   AST 24 15 - 41 U/L   ALT 12 (L) 14 - 54 U/L   Alkaline Phosphatase 46 38 - 126 U/L   Total Bilirubin 0.9 0.3 - 1.2 mg/dL   GFR calc non Af Amer 22 (L) >60 mL/min   GFR calc Af Amer 26 (L) >60 mL/min    Comment: (NOTE) The eGFR has been calculated using the CKD EPI equation. This calculation has not been validated in all clinical situations. eGFR's persistently <60 mL/min signify possible Chronic Kidney Disease.    Anion gap 9 5 - 15  Lipase, blood     Status: None   Collection Time: 10/16/17  6:04 AM  Result Value Ref Range   Lipase 33 11 - 51 U/L  Protime-INR     Status: Abnormal   Collection Time: 10/16/17  6:04 AM  Result Value Ref Range   Prothrombin Time 32.7 (H) 11.4 - 15.2 seconds   INR 3.22   Type and screen [place]     Status: None   Collection Time: 10/16/17  6:04 AM  Result Value Ref Range   ABO/RH(D) O POS    Antibody Screen NEG    Sample Expiration 10/19/2017    Unit Number [id number]    Blood Component Type RED CELLS,LR    Unit division 00    Status of Unit ISSUED,FINAL    Transfusion Status OK TO TRANSFUSE    Crossmatch Result Compatible    Unit Number [id number]    Blood Component Type RED CELLS,LR    Unit division 00    Status of Unit ISSUED,FINAL    Transfusion Status OK TO TRANSFUSE    Crossmatch Result Compatible   Troponin I     Status: Abnormal   Collection Time: 10/16/17  6:04 AM  Result Value Ref Range   Troponin I 0.03 (HH) <0.03 ng/mL    Comment: CRITICAL RESULT CALLED TO, R TCHEAD BACK BY AND VERIFIED WITH JENNIFER DALEY @ 0649 10/16/17 [place]   Prepare fresh frozen plasma     Status: None   Collection Time:  10/16/17  8:30 AM  Result Value Ref Range   Unit Number [id number]    Blood Component Type THAWED PLASMA    Unit division 00  Status of Unit ISSUED,FINAL    Transfusion Status OK TO TRANSFUSE    Unit Number [id number]    Blood Component Type THAWED PLASMA    Unit division 00    Status of Unit ISSUED,FINAL    Transfusion Status OK TO TRANSFUSE   MRSA PCR Screening     Status: None   Collection Time: 10/16/17 12:40 PM  Result Value Ref Range   MRSA by PCR NEGATIVE NEGATIVE    Comment:        The GeneXpert MRSA Assay (FDA approved for NASAL specimens only), is one component of a comprehensive MRSA colonization surveillance program. It is not intended to diagnose MRSA infection nor to guide or monitor treatment for MRSA infections.   Troponin I     Status: Abnormal   Collection Time: 10/16/17 12:56 PM  Result Value Ref Range   Troponin I 0.04 (HH) <0.03 ng/mL    Comment: CRITICAL VALUE NOTED. VALUE IS CONSISTENT WITH PREVIOUSLY REPORTED/CALLED VALUE [facility]   Hemoglobin     Status: Abnormal   Collection Time: 10/16/17 12:56 PM  Result Value Ref Range   Hemoglobin 7.0 (L) 12.0 - 16.0 g/dL  ABO/Rh     Status: None   Collection Time: 10/16/17 12:56 PM  Result Value Ref Range   ABO/RH(D) O POS   Prepare RBC     Status: None   Collection Time: 10/16/17  2:30 PM  Result Value Ref Range   Order Confirmation ORDER PROCESSED BY BLOOD BANK   Troponin I     Status: Abnormal   Collection Time: 10/16/17  6:47 PM  Result Value Ref Range   Troponin I 0.97 (HH) <0.03 ng/mL    Comment: CRITICAL RESULT CALLED TO, READ BACK BY AND VERIFIED WITH KELCEY WATTS 10/16/17 @ 1947  [place]   CBC     Status: Abnormal   Collection Time: 10/16/17  6:47 PM  Result Value Ref Range   WBC 5.2 3.6 - 11.0 K/uL   RBC 2.46 (L) 3.80 - 5.20 MIL/uL   Hemoglobin 7.6 (L) 12.0 - 16.0 g/dL   HCT 22.5 (L) 35.0 - 47.0 %   MCV 91.5 80.0 - 100.0 fL   MCH 31.0 26.0 - 34.0 pg   MCHC 33.9 32.0 - 36.0 g/dL    RDW 14.5 11.5 - 14.5 %   Platelets 160 150 - 440 K/uL  APTT     Status: None   Collection Time: 10/16/17  6:47 PM  Result Value Ref Range   aPTT 33 24 - 36 seconds  Protime-INR     Status: Abnormal   Collection Time: 10/16/17  6:47 PM  Result Value Ref Range   Prothrombin Time 16.5 (H) 11.4 - 15.2 seconds   INR 1.34   Prepare RBC     Status: None   Collection Time: 10/16/17  8:30 PM  Result Value Ref Range   Order Confirmation ORDER PROCESSED BY BLOOD BANK   Troponin I     Status: Abnormal   Collection Time: 10/17/17  1:20 AM  Result Value Ref Range   Troponin I 4.54 (HH) <0.03 ng/mL    Comment: CRITICAL VALUE NOTED. VALUE IS CONSISTENT WITH PREVIOUSLY REPORTED/CALLED VALUE [place]  CBC     Status: Abnormal   Collection Time: 10/17/17  1:20 AM  Result Value Ref Range   WBC 7.9 3.6 - 11.0 K/uL   RBC 3.01 (L) 3.80 - 5.20 MIL/uL   Hemoglobin 9.0 (L) 12.0 - 16.0 g/dL   HCT 27.0 (  L) 35.0 - 47.0 %   MCV 89.7 80.0 - 100.0 fL   MCH 29.9 26.0 - 34.0 pg   MCHC 33.3 32.0 - 36.0 g/dL   RDW 16.7 (H) 11.5 - 14.5 %   Platelets 139 (L) 150 - 440 K/uL    Comment: COUNT MAY BE INACCURATE DUE TO FIBRIN CLUMPS.  Basic metabolic panel     Status: Abnormal   Collection Time: 10/17/17  5:13 AM  Result Value Ref Range   Sodium 139 135 - 145 mmol/L   Potassium 4.1 3.5 - 5.1 mmol/L   Chloride 110 101 - 111 mmol/L   CO2 23 22 - 32 mmol/L   Glucose, Bld 96 65 - 99 mg/dL   BUN 45 (H) 6 - 20 mg/dL   Creatinine, Ser 1.65 (H) 0.44 - 1.00 mg/dL   Calcium 8.6 (L) 8.9 - 10.3 mg/dL   GFR calc non Af Amer 27 (L) >60 mL/min   GFR calc Af Amer 32 (L) >60 mL/min    Comment: (NOTE) The eGFR has been calculated using the CKD EPI equation. This calculation has not been validated in all clinical situations. eGFR's persistently <60 mL/min signify possible Chronic Kidney Disease.    Anion gap 6 5 - 15  Magnesium     Status: Abnormal   Collection Time: 10/17/17  5:13 AM  Result Value Ref Range   Magnesium 1.5  (L) 1.7 - 2.4 mg/dL  Phosphorus     Status: None   Collection Time: 10/17/17  5:13 AM  Result Value Ref Range   Phosphorus 3.0 2.5 - 4.6 mg/dL  CBC     Status: Abnormal   Collection Time: 10/17/17  6:49 AM  Result Value Ref Range   WBC 6.6 3.6 - 11.0 K/uL   RBC 3.03 (L) 3.80 - 5.20 MIL/uL   Hemoglobin 9.1 (L) 12.0 - 16.0 g/dL   HCT 26.8 (L) 35.0 - 47.0 %   MCV 88.7 80.0 - 100.0 fL   MCH 30.0 26.0 - 34.0 pg   MCHC 33.8 32.0 - 36.0 g/dL   RDW 17.1 (H) 11.5 - 14.5 %   Platelets 157 150 - 440 K/uL  Hemoglobin     Status: Abnormal   Collection Time: 10/17/17 10:52 AM  Result Value Ref Range   Hemoglobin 9.4 (L) 12.0 - 16.0 g/dL    Dg Chest Port 1 View  Result Date: 10/16/2017 CLINICAL DATA:  Acute onset of vomiting.  Hematemesis. EXAM: PORTABLE CHEST 1 VIEW COMPARISON:  Chest radiograph performed 10/27/2013, and CT of the chest performed 11/15/2013 FINDINGS: The lungs are well-aerated. Minimal right basilar scarring is noted. Peribronchial thickening is seen. There is no evidence of pleural effusion or pneumothorax. The cardiomediastinal silhouette is within normal limits. No acute osseous abnormalities are seen. IMPRESSION: Minimal right basilar scarring.  Peribronchial thickening seen. Electronically Signed   By: Garald Balding M.D.   On: 10/16/2017 06:41    Review of Systems  Constitutional: Positive for malaise/fatigue.  HENT: Negative.   Eyes: Negative.   Respiratory: Positive for shortness of breath.   Cardiovascular: Positive for palpitations and leg swelling.  Gastrointestinal: Positive for abdominal pain, heartburn and nausea.  Genitourinary: Negative.   Musculoskeletal: Positive for myalgias.  Skin: Negative.   Neurological: Positive for weakness.  Endo/Heme/Allergies: Negative.   Psychiatric/Behavioral: Negative.    Blood pressure (!) 153/47, pulse 68, temperature 98.2 F (36.8 C), temperature source Oral, resp. rate 17, height '5\' 4"'  (1.626 m), weight 185 lb 10 oz  (  84.2 kg), SpO2 (!) 88 %. Physical Exam  Nursing note and vitals reviewed. Constitutional: She is oriented to person, place, and time. She appears well-developed and well-nourished.  HENT:  Head: Normocephalic and atraumatic.  Eyes: Conjunctivae and EOM are normal. Pupils are equal, round, and reactive to light.  Neck: Normal range of motion. Neck supple.  Cardiovascular: Normal rate and regular rhythm.  Murmur heard. Respiratory: Effort normal and breath sounds normal.  GI: Soft. Bowel sounds are normal.  Musculoskeletal: Normal range of motion.  Neurological: She is alert and oriented to person, place, and time. She has normal reflexes.  Skin: Skin is warm and dry.  Psychiatric: She has a normal mood and affect.    Assessment/Plan: Elevated troponin  Possible demand ischemia or non-STEMI Acute GI bleeding Anemia Hypertension Acute on chronic renal insufficiency GERD Peripheral vascular disease Arteriosclerotic vascular disease with stent RCA Chronic infection of the right femoral Hyperlipidemia Former smoker . Plan Agree with ICU level care Continue blood pressure support Would recommend low-dose beta-blockade therapy as long as blood pressure tolerates we'll also consider nitrates if the patient has significant chest pain symptoms Agree with transfusion maintain hemoglobin of around  Do not recommend anticoagulation with aspirin or heparin  Follow-up troponins but this appears to be demand ischemia  Recommend further GI evaluation possible EGD as an acceptable risk moderate to high risk  Do not recommend any invasive cardiac studies  Echocardiogram suggests normal overall left ventricular function  Agree with IV Protonix and Zantac possibly Carafate  Dwayne D Callwood 10/17/2017, 11:29 AM

## 2017-10-17 NOTE — Progress Notes (Signed)
Patient's troponin is acutely elevated to 4.5 today.  Endoscopy will does need to be delayed until further cardiac workup is complete and cardiology consult is obtained.  No active GI bleeding since admission.  Continue to monitor serial CBCs and transfuse as needed Continue Protonix 40 mg IV twice daily.  Continue to correct coagulopathy and hold anticoagulation.

## 2017-10-18 DIAGNOSIS — D62 Acute posthemorrhagic anemia: Secondary | ICD-10-CM

## 2017-10-18 LAB — BASIC METABOLIC PANEL
Anion gap: 6 (ref 5–15)
BUN: 34 mg/dL — ABNORMAL HIGH (ref 6–20)
CHLORIDE: 108 mmol/L (ref 101–111)
CO2: 21 mmol/L — ABNORMAL LOW (ref 22–32)
CREATININE: 1.56 mg/dL — AB (ref 0.44–1.00)
Calcium: 8.4 mg/dL — ABNORMAL LOW (ref 8.9–10.3)
GFR, EST AFRICAN AMERICAN: 34 mL/min — AB (ref >60)
GFR, EST NON AFRICAN AMERICAN: 29 mL/min — AB (ref >60)
Glucose, Bld: 119 mg/dL — ABNORMAL HIGH (ref 65–99)
POTASSIUM: 4.2 mmol/L (ref 3.5–5.1)
SODIUM: 135 mmol/L (ref 135–145)

## 2017-10-18 LAB — CBC
HCT: 28.2 % — ABNORMAL LOW (ref 35.0–47.0)
HEMOGLOBIN: 9.6 g/dL — AB (ref 12.0–16.0)
MCH: 30.6 pg (ref 26.0–34.0)
MCHC: 34 g/dL (ref 32.0–36.0)
MCV: 90 fL (ref 80.0–100.0)
Platelets: 177 10*3/uL (ref 150–440)
RBC: 3.14 MIL/uL — AB (ref 3.80–5.20)
RDW: 16.8 % — ABNORMAL HIGH (ref 11.5–14.5)
WBC: 9 10*3/uL (ref 3.6–11.0)

## 2017-10-18 LAB — PROTIME-INR
INR: 1.25
PROTHROMBIN TIME: 15.6 s — AB (ref 11.4–15.2)

## 2017-10-18 LAB — TROPONIN I: Troponin I: 4.56 ng/mL (ref <0.03)

## 2017-10-18 LAB — ECHOCARDIOGRAM COMPLETE
HEIGHTINCHES: 64 in
WEIGHTICAEL: 2970.04 [oz_av]

## 2017-10-18 MED ORDER — TRAZODONE HCL 50 MG PO TABS
50.0000 mg | ORAL_TABLET | Freq: Once | ORAL | Status: AC
  Administered 2017-10-18: 50 mg via ORAL
  Filled 2017-10-18 (×2): qty 1

## 2017-10-18 MED ORDER — ALBUTEROL SULFATE (2.5 MG/3ML) 0.083% IN NEBU
2.5000 mg | INHALATION_SOLUTION | RESPIRATORY_TRACT | Status: DC | PRN
  Administered 2017-10-18 (×2): 2.5 mg via RESPIRATORY_TRACT
  Filled 2017-10-18 (×3): qty 3

## 2017-10-18 NOTE — Evaluation (Signed)
Physical Therapy Evaluation Patient Details Name: Lori Johnson MRN: [id number] DOB: Mar 08, 1930 Today's Date: 10/18/2017   History of Present Illness  Pt is a 83 y/o F who presented after several episodes of hematemesis due to upper GI bleed.  Pt's PMH includes CAD s/p stent, PAD s/p bypass surgery after an arterial clot, gout, chronic infection of R femoral graft.    Clinical Impression  Pt admitted with above diagnosis. Pt currently with functional limitations due to the deficits listed below (see PT Problem List). Lori Johnson was very motivated and eager to work with PT.  She ambulated 100 ft with RW and one seated rest break.  SpO2 remained at or above 93% on RA throughout session.  Pt will benefit from skilled PT to increase their independence and safety with mobility to allow discharge to the venue listed below.      Follow Up Recommendations Home health PT;Supervision for mobility/OOB    Equipment Recommendations  None recommended by PT    Recommendations for Other Services       Precautions / Restrictions Precautions Precautions: Fall;Other (comment) Precaution Comments: Monitor O2 Restrictions Weight Bearing Restrictions: No      Mobility  Bed Mobility Overal bed mobility: Needs Assistance Bed Mobility: Sit to Supine       Sit to supine: Min guard   General bed mobility comments: Min guard for safety.  No physical assist or cues needed.  Transfers Overall transfer level: Needs assistance Equipment used: Rolling walker (2 wheeled) Transfers: Sit to/from Stand Sit to Stand: Min guard         General transfer comment: Min guard for safety.  Pt demonstrates proper technique and hand placement.  Pt stands slowly.   Ambulation/Gait Ambulation/Gait assistance: Min guard Ambulation Distance (Feet): 100 Feet Assistive device: Rolling walker (2 wheeled) Gait Pattern/deviations: Step-through pattern;Trunk flexed Gait velocity: decreased Gait velocity interpretation:  Below normal speed for age/gender General Gait Details: Pt with flexed posture which improves with verbal cues for upright posture.  SpO2 remains at or above 93% on RA throughout session with verbal cues and demonstration throughout for pursed lip breathing.  Pt with one seated rest break after ambulating initial 40 ft.    Stairs            Wheelchair Mobility    Modified Rankin (Stroke Patients Only)       Balance Overall balance assessment: Needs assistance Sitting-balance support: No upper extremity supported;Feet supported Sitting balance-Leahy Scale: Good     Standing balance support: No upper extremity supported;During functional activity Standing balance-Leahy Scale: Fair Standing balance comment: Pt able to stand statically without UE support but relies on RW for support when ambulating.                              Pertinent Vitals/Pain Pain Assessment: No/denies pain    Home Living Family/patient expects to be discharged to:: Private residence Living Arrangements: Children Available Help at Discharge: Family;Available 24 hours/day(daughter lives with pt) Type of Home: House Home Access: Stairs to enter Entrance Stairs-Rails: None Entrance Stairs-Number of Steps: 2 Home Layout: Two level;Able to live on main level with bedroom/bathroom Home Equipment: Walker - 4 wheels;Bedside commode;Cane - single point;Shower seat      Prior Function Level of Independence: Independent with assistive device(s)         Comments: Pt ambulates without AD unless ambulating longer distances and pt uses her rollator.  She denies any  falls in the past 6 months.  Pt independent with all ADLs.  Daughter does the driving due to pt's poor vision.      Hand Dominance        Extremity/Trunk Assessment   Upper Extremity Assessment Upper Extremity Assessment: (BUE strength grossly 4-/5)    Lower Extremity Assessment Lower Extremity Assessment: (BLE strength  grossly 4/5)       Communication   Communication: No difficulties  Cognition Arousal/Alertness: Awake/alert Behavior During Therapy: WFL for tasks assessed/performed Overall Cognitive Status: Within Functional Limits for tasks assessed                                        General Comments General comments (skin integrity, edema, etc.): Daughter present during PT Evaluation.     Exercises General Exercises - Upper Extremity Shoulder Flexion: AROM;Both;10 reps;Seated General Exercises - Lower Extremity Ankle Circles/Pumps: AROM;Both;10 reps;Seated Quad Sets: Strengthening;Both;10 reps;Seated Long Arc Quad: Strengthening;Both;10 reps;Seated   Assessment/Plan    PT Assessment Patient needs continued PT services  PT Problem List Decreased strength;Decreased activity tolerance;Decreased balance;Decreased knowledge of use of DME;Decreased safety awareness;Cardiopulmonary status limiting activity       PT Treatment Interventions DME instruction;Gait training;Stair training;Functional mobility training;Therapeutic activities;Therapeutic exercise;Balance training;Neuromuscular re-education;Patient/family education    PT Goals (Current goals can be found in the Care Plan section)  Acute Rehab PT Goals Patient Stated Goal: to go home and return to PLOF PT Goal Formulation: With patient Time For Goal Achievement: 11/01/17 Potential to Achieve Goals: Good    Frequency Min 2X/week   Barriers to discharge        Co-evaluation               AM-PAC PT "6 Clicks" Daily Activity  Outcome Measure Difficulty turning over in bed (including adjusting bedclothes, sheets and blankets)?: None Difficulty moving from lying on back to sitting on the side of the bed? : A Lot Difficulty sitting down on and standing up from a chair with arms (e.g., wheelchair, bedside commode, etc,.)?: A Lot Help needed moving to and from a bed to chair (including a wheelchair)?: A  Little Help needed walking in hospital room?: A Little Help needed climbing 3-5 steps with a railing? : A Little 6 Click Score: 17    End of Session Equipment Utilized During Treatment: Gait belt;Oxygen Activity Tolerance: Patient tolerated treatment well;Patient limited by fatigue Patient left: in bed;with call bell/phone within reach;with bed alarm set;Other (comment);with family/visitor present(with bed in upright position for improved pulmonary function) Nurse Communication: Mobility status;Other (comment)(SpO2) PT Visit Diagnosis: Muscle weakness (generalized) (M62.81);Unsteadiness on feet (R26.81);Other abnormalities of gait and mobility (R26.89)    Time: 5277-8242 PT Time Calculation (min) (ACUTE ONLY): 36 min   Charges:   PT Evaluation $PT Eval Moderate Complexity: 1 Mod PT Treatments $Gait Training: 8-22 mins   PT G Codes:   PT G-Codes **NOT FOR INPATIENT CLASS** Functional Assessment Tool Used: AM-PAC 6 Clicks Basic Mobility;Clinical judgement Functional Limitation: Mobility: Walking and moving around Mobility: Walking and Moving Around Current Status (P5361): At least 40 percent but less than 60 percent impaired, limited or restricted Mobility: Walking and Moving Around Goal Status [id number]): At least 1 percent but less than 20 percent impaired, limited or restricted    Collie Siad PT, DPT 10/18/2017, 2:25 PM

## 2017-10-18 NOTE — Progress Notes (Signed)
   Vonda Antigua, MD [address] [place], [place], [place], [address] [address], [place], [place], [place], [address] Phone: [phone]  Fax: [phone]   Subjective: Pt. Tolerating PO diet without difficulty. No further episodes of gi bleeding. No emesis or melena.   Objective: Vital signs in last 24 hours: Vitals:   10/18/17 1600 10/18/17 1647 10/18/17 1859 10/18/17 1925  BP:  (!) 152/49  (!) 145/52  Pulse: 65 70  71  Resp: (!) 29 18  18   Temp:    98 F (36.7 C)  TempSrc:    Oral  SpO2: (!) 88% 93% 93% 93%  Weight:      Height:       Weight change:   Intake/Output Summary (Last 24 hours) at 10/18/2017 2215 Last data filed at 10/18/2017 2100 Gross per 24 hour  Intake 2150 ml  Output 950 ml  Net 1200 ml     Exam: Cardiac: +S1, +S2, RRR, No edema Pulm: CTA b/l, Normal Resp Effort Abd: Soft, NT/ND, No HSM Skin: Warm, no rashes Neck: Supple, Trachea midline   Lab Results: @LABTEST2 @ Micro Results: Recent Results (from the past 240 hour(s))  MRSA PCR Screening     Status: None   Collection Time: 10/16/17 12:40 PM  Result Value Ref Range Status   MRSA by PCR NEGATIVE NEGATIVE Final    Comment:        The GeneXpert MRSA Assay (FDA approved for NASAL specimens only), is one component of a comprehensive MRSA colonization surveillance program. It is not intended to diagnose MRSA infection nor to guide or monitor treatment for MRSA infections.    Studies/Results: No results found. Medications:  Scheduled Meds: . amLODipine  10 mg Oral Daily  . mometasone-formoterol  2 puff Inhalation BID  . nitroGLYCERIN  0.5 inch Topical Q6H   Continuous Infusions: . sodium chloride    . sodium chloride Stopped (10/16/17 2016)  . pantoprozole (PROTONIX) infusion 8 mg/hr (10/18/17 1400)   PRN Meds:.acetaminophen **OR** acetaminophen, albuterol, [DISCONTINUED] ondansetron **OR** ondansetron (ZOFRAN) IV   Assessment: Active Problems:   GI  bleed    Plan: Pt. remains at high risk for endoscopic procedure with her elevated Troponin. Cardiology eval suggests a moderate to high risk of procedure.  A procedure with sedation (EGD) carries the risk of cardiac events among other risks in the setting of an elevated Troponin Given that the pt. has no active bleeding at this time, risks of procedure would outweigh benefits at this time. Would recommend continuing Protonix 40 IV BID Avoid NSAIDs Serial CBCs and transfuse PRN   LOS: 2 days   Vonda Antigua, MD 10/18/2017, 10:15 PM

## 2017-10-18 NOTE — Progress Notes (Signed)
I saw patient earlier today on morning rounds.  She was stable for transfer on nasal cannula oxygen.  I have ordered a chest x-ray in a.m. 11/20. PCCM will sign off. Please call if we can be of further assistance  Merton Border, MD PCCM service Mobile [phone] Pager [phone] 10/18/2017 6:21 PM

## 2017-10-18 NOTE — Plan of Care (Signed)
  Progressing Education: Knowledge of General Education information will improve 10/18/2017 1844 - Progressing by Feliberto Gottron, RN Clinical Measurements: Will remain free from infection 10/18/2017 1844 - Progressing by Chriss Czar, Illene Bolus, RN Activity: Risk for activity intolerance will decrease 10/18/2017 1844 - Progressing by Chriss Czar, Illene Bolus, RN Nutrition: Adequate nutrition will be maintained 10/18/2017 1844 - Progressing by Feliberto Gottron, RN

## 2017-10-18 NOTE — Progress Notes (Signed)
Pt being transferred to room 234. Report called to [place], Therapist, sports. Pt and belongings transferred to room 234 without incident.

## 2017-10-18 NOTE — Progress Notes (Signed)
[facility] [place] NAME: Lori Johnson    MR#:  [id number]  DATE OF BIRTH:  30-May-1930  SUBJECTIVE:  CHIEF COMPLAINT:   Chief Complaint  Patient presents with  . GI Bleeding   - No further hematemesis, hemoglobin has remained stable since that 1 unit transfusion on admission -Tolerating clear liquids well. No chest pain. -Troponin elevated but stable. Still acutely requiring 3 L oxygen  REVIEW OF SYSTEMS:  Review of Systems  Constitutional: Positive for malaise/fatigue. Negative for chills and fever.  HENT: Negative for congestion, ear discharge, hearing loss and nosebleeds.   Eyes: Negative for blurred vision and double vision.  Respiratory: Negative for cough, shortness of breath and wheezing.   Cardiovascular: Negative for chest pain and palpitations.  Gastrointestinal: Negative for abdominal pain, constipation, diarrhea, nausea and vomiting.  Genitourinary: Negative for dysuria.  Musculoskeletal: Negative for myalgias.  Neurological: Negative for dizziness, speech change, focal weakness, seizures and headaches.  Psychiatric/Behavioral: Negative for depression.    DRUG ALLERGIES:   Allergies  Allergen Reactions  . Pletal [Cilostazol] Swelling  . Erythromycin Hives  . Codeine Nausea Only and Nausea And Vomiting  . Erythromycin Base Itching and Rash  . Penicillins Hives, Itching and Rash  . Sulfa Antibiotics Rash    VITALS:  Blood pressure 114/69, pulse 69, temperature 98.1 F (36.7 C), temperature source Oral, resp. rate (!) 26, height 5\' 4"  (1.626 m), weight 84.2 kg (185 lb 10 oz), SpO2 96 %.  PHYSICAL EXAMINATION:  Physical Exam  GENERAL:  83 y.o.-year-old pleasant patient lying in the bed with no acute distress.  EYES: Pupils equal, round, reactive to light and accommodation. No scleral icterus. Extraocular muscles intact.  HEENT: Head atraumatic, normocephalic. Oropharynx and nasopharynx clear.  NECK:  Supple, no  jugular venous distention. No thyroid enlargement, no tenderness.  LUNGS: Normal breath sounds bilaterally, no wheezing, rales,rhonchi or crepitation. No use of accessory muscles of respiration. Decreased bibasilar breath sounds CARDIOVASCULAR: S1, S2 normal. No  rubs, or gallops. 3/6 systolic murmur present ABDOMEN: Soft, nontender, nondistended. Bowel sounds present. No organomegaly or mass.  EXTREMITIES: No pedal edema, cyanosis, or clubbing.  NEUROLOGIC: Cranial nerves II through XII are intact. Muscle strength 5/5 in all extremities. Sensation intact. Gait not checked.  PSYCHIATRIC: The patient is alert and oriented x 3.  SKIN: No obvious rash, lesion, or ulcer.      LABORATORY PANEL:   CBC Recent Labs  Lab 10/18/17 0320  WBC 9.0  HGB 9.6*  HCT 28.2*  PLT 177   ------------------------------------------------------------------------------------------------------------------  Chemistries  Recent Labs  Lab 10/16/17 0604 10/17/17 0513 10/18/17 0320  NA 137 139 135  K 4.1 4.1 4.2  CL 104 110 108  CO2 24 23 21*  GLUCOSE 146* 96 119*  BUN 49* 45* 34*  CREATININE 1.96* 1.65* 1.56*  CALCIUM 9.4 8.6* 8.4*  MG  --  1.5*  --   AST 24  --   --   ALT 12*  --   --   ALKPHOS 46  --   --   BILITOT 0.9  --   --    ------------------------------------------------------------------------------------------------------------------  Cardiac Enzymes Recent Labs  Lab 10/18/17 0816  TROPONINI 4.56*   ------------------------------------------------------------------------------------------------------------------  RADIOLOGY:  No results found.  EKG:   Orders placed or performed during the hospital encounter of 10/16/17  . ED EKG  . ED EKG  . EKG 12-Lead  . EKG 12-Lead  . EKG 12-Lead  .  EKG 12-Lead  . EKG 12-Lead  . EKG 12-Lead  . EKG 12-Lead  . EKG 12-Lead    ASSESSMENT AND PLAN:   Lori Johnson  is a 83 y.o. female with a known history of CAD status post stent,  peripheral arterial disease status post bypass surgery after an arterial clot and on Coumadin, chronic gout, CKD, hypertension, arthritis, hyperlipidemia, GERD presents to hospital secondary to several episodes of hematemesis that started early this morning.  #1 Upper GI bleed- hematemesis 4 episodes -admitted to stepdown, drop in hemoglobin with 1 unit packed RBC transfusion received -Continue Protonix drip. Since stable, drip can be changed to IV Protonix twice a day today if okay by GI -GI consult is appreciated. Will need EGD once cleared by cardiology -Tolerating clear liquids well at this time. -No active bleeding at this time -coumadin will be held  #2 acute blood loss anemia-drop in hemoglobin noted from 12- 7 -Received 1 unit packed RBC transfusion on admission, Coumadin held. Hemoglobin is stable at 9. Monitor closely -Monitor hemoglobin and transfuse if hemodynamically unstable or hemoglobin less than 7. -Hold Coumadin.   #3 NSTEMI- troponin slightly elevated, patient also presented with chest pain. Could be demand ischemia from rapid drop in hemoglobin. -Denies any chest pain at this time. EKG without any acute changes -Echocardiogram ordered to check wall motion abnormalities.  - Appreciate cardiology consult. Echocardiogram review showed normal ejection fraction and no wall motion abnormalities. She is a moderate to high risk for procedure at this time according to cardiology. -Troponin is still elevated, but plateaued at this time  #4 peripheral vascular disease-had arterial thrombus on Coumadin-about 4 years ago -Since then has had bypass of lower extremities and decent arterial duplex on last year exam. -We will hold Coumadin for now. Follow up with vascular as outpatient  #5 hypertension-restarted Norvasc as blood pressure is stable  #6 CK D stage IV-stable. Baseline creatinine seems to be around 2  #7 DVT prophylaxis-Ted's and SCDs only at this time  #8 acute  hypoxia-repeat chest x-ray today, has known COPD. Continue inhalers. -Wean oxygen as tolerated.  Can be transferred to floor today   All the records are reviewed and case discussed with Care Management/Social Workerr. Management plans discussed with the patient, family and they are in agreement.  CODE STATUS: Full code  TOTAL TIME TAKING CARE OF THIS PATIENT: 36 minutes.   POSSIBLE D/C IN 2 DAYS, DEPENDING ON CLINICAL CONDITION.   Gladstone Lighter M.D on 10/18/2017 at 9:41 AM  Between 7am to 6pm - Pager - [phone]  After 6pm go to [email or web address] - password [email or web address] [facility]  Office  [phone]  CC: Primary care physician; Chrismon, Vickki Muff, PA

## 2017-10-19 ENCOUNTER — Inpatient Hospital Stay: Payer: Medicare PPO

## 2017-10-19 LAB — CBC
HCT: 26.2 % — ABNORMAL LOW (ref 35.0–47.0)
HEMOGLOBIN: 8.8 g/dL — AB (ref 12.0–16.0)
MCH: 30.1 pg (ref 26.0–34.0)
MCHC: 33.5 g/dL (ref 32.0–36.0)
MCV: 89.8 fL (ref 80.0–100.0)
PLATELETS: 165 10*3/uL (ref 150–440)
RBC: 2.91 MIL/uL — AB (ref 3.80–5.20)
RDW: 15.7 % — ABNORMAL HIGH (ref 11.5–14.5)
WBC: 8 10*3/uL (ref 3.6–11.0)

## 2017-10-19 MED ORDER — PANTOPRAZOLE SODIUM 40 MG IV SOLR
40.0000 mg | Freq: Two times a day (BID) | INTRAVENOUS | Status: DC
  Administered 2017-10-19 – 2017-10-20 (×3): 40 mg via INTRAVENOUS
  Filled 2017-10-19 (×3): qty 40

## 2017-10-19 MED ORDER — FUROSEMIDE 10 MG/ML IJ SOLN
40.0000 mg | Freq: Once | INTRAMUSCULAR | Status: AC
  Administered 2017-10-19: 40 mg via INTRAVENOUS
  Filled 2017-10-19: qty 4

## 2017-10-19 MED ORDER — FUROSEMIDE 20 MG PO TABS
20.0000 mg | ORAL_TABLET | Freq: Every day | ORAL | Status: DC
  Administered 2017-10-19: 20 mg via ORAL
  Filled 2017-10-19: qty 1

## 2017-10-19 MED ORDER — SODIUM CHLORIDE 0.9% FLUSH
3.0000 mL | Freq: Two times a day (BID) | INTRAVENOUS | Status: DC
  Administered 2017-10-19 – 2017-10-20 (×2): 3 mL via INTRAVENOUS

## 2017-10-19 NOTE — Progress Notes (Signed)
Ambulated around nurses station times 1. Pt very winded, HR up to 121, sats down to 80% ambulating.  Pt back to room in chair, oxygenation recovered to 94%.  Will  Monitor.

## 2017-10-19 NOTE — Progress Notes (Signed)
[facility] [place] NAME: Lori Johnson    MR#:  [id number]  DATE OF BIRTH:  05-06-1930  SUBJECTIVE:  CHIEF COMPLAINT:   Chief Complaint  Patient presents with  . GI Bleeding   - Hypoxic this morning, restarted back on 2 L oxygen which is acute. -Tolerating a regular diet today -Slight drop in hemoglobin noted. No plans for endoscopy at this time  REVIEW OF SYSTEMS:  Review of Systems  Constitutional: Positive for malaise/fatigue. Negative for chills and fever.  HENT: Negative for congestion, ear discharge, hearing loss and nosebleeds.   Eyes: Negative for blurred vision and double vision.  Respiratory: Positive for shortness of breath. Negative for cough and wheezing.   Cardiovascular: Negative for chest pain and palpitations.  Gastrointestinal: Negative for abdominal pain, constipation, diarrhea, nausea and vomiting.  Genitourinary: Negative for dysuria.  Musculoskeletal: Negative for myalgias.  Neurological: Negative for dizziness, speech change, focal weakness, seizures and headaches.  Psychiatric/Behavioral: Negative for depression.    DRUG ALLERGIES:   Allergies  Allergen Reactions  . Pletal [Cilostazol] Swelling  . Erythromycin Hives  . Codeine Nausea Only and Nausea And Vomiting  . Erythromycin Base Itching and Rash  . Penicillins Hives, Itching and Rash  . Sulfa Antibiotics Rash    VITALS:  Blood pressure (!) 116/48, pulse 72, temperature 98.3 F (36.8 C), temperature source Oral, resp. rate 18, height 5\' 4"  (1.626 m), weight 84.2 kg (185 lb 10 oz), SpO2 93 %.  PHYSICAL EXAMINATION:  Physical Exam  GENERAL:  83 y.o.-year-old pleasant patient lying in the bed with no acute distress.  EYES: Pupils equal, round, reactive to light and accommodation. No scleral icterus. Extraocular muscles intact.  HEENT: Head atraumatic, normocephalic. Oropharynx and nasopharynx clear.  NECK:  Supple, no jugular venous distention. No  thyroid enlargement, no tenderness.  LUNGS: Normal breath sounds bilaterally, no wheezing,  rhonchi or crepitation. No use of accessory muscles of respiration. Fine bibasilar crackles heard at the bases CARDIOVASCULAR: S1, S2 normal. No  rubs, or gallops. 3/6 systolic murmur present ABDOMEN: Soft, nontender, nondistended. Bowel sounds present. No organomegaly or mass.  EXTREMITIES: No pedal edema, cyanosis, or clubbing.  NEUROLOGIC: Cranial nerves II through XII are intact. Muscle strength 5/5 in all extremities. Sensation intact. Gait not checked.  PSYCHIATRIC: The patient is alert and oriented x 3.  SKIN: No obvious rash, lesion, or ulcer.      LABORATORY PANEL:   CBC Recent Labs  Lab 10/19/17 0253  WBC 8.0  HGB 8.8*  HCT 26.2*  PLT 165   ------------------------------------------------------------------------------------------------------------------  Chemistries  Recent Labs  Lab 10/16/17 0604 10/17/17 0513 10/18/17 0320  NA 137 139 135  K 4.1 4.1 4.2  CL 104 110 108  CO2 24 23 21*  GLUCOSE 146* 96 119*  BUN 49* 45* 34*  CREATININE 1.96* 1.65* 1.56*  CALCIUM 9.4 8.6* 8.4*  MG  --  1.5*  --   AST 24  --   --   ALT 12*  --   --   ALKPHOS 46  --   --   BILITOT 0.9  --   --    ------------------------------------------------------------------------------------------------------------------  Cardiac Enzymes Recent Labs  Lab 10/18/17 0816  TROPONINI 4.56*   ------------------------------------------------------------------------------------------------------------------  RADIOLOGY:  Dg Chest Port 1 View  Result Date: 10/19/2017 CLINICAL DATA:  84 year old female admitted for GI bleeding three days ago. Hypoxia. EXAM: PORTABLE CHEST 1 VIEW COMPARISON:  10/16/2017 and earlier. FINDINGS: Portable AP  upright view at 0700 hours. Cardiomegaly and bilateral pulmonary interstitial opacity. Increased. There is also increased patchy, more confluent opacity at both  medial lung bases. No pneumothorax. No definite pleural effusion. Calcified aortic atherosclerosis. Visualized tracheal air column is within normal limits. IMPRESSION: 1. Cardiomegaly and pulmonary vascularity appear increased since admission. Query volume overload. 2. New patchy opacity at both medial lung bases. This is nonspecific. Aspiration or developing infection difficult to exclude. Electronically Signed   By: Genevie Ann M.D.   On: 10/19/2017 09:19    EKG:   Orders placed or performed during the hospital encounter of 10/16/17  . ED EKG  . ED EKG  . EKG 12-Lead  . EKG 12-Lead  . EKG 12-Lead  . EKG 12-Lead  . EKG 12-Lead  . EKG 12-Lead  . EKG 12-Lead  . EKG 12-Lead    ASSESSMENT AND PLAN:   Lori Johnson  is a 83 y.o. female with a known history of CAD status post stent, peripheral arterial disease status post bypass surgery after an arterial clot and on Coumadin, chronic gout, CKD, hypertension, arthritis, hyperlipidemia, GERD presents to hospital secondary to several episodes of hematemesis that started early this morning.  #1 Upper GI bleed- hematemesis on adm,  -received 1 unit packed RBC transfusion on adm, hb at 9 and was stable- slight drop noted today -On IV Protonix drip.  -GI consult is appreciated. Due to her NSTEMI, she is a moderate to high risk for EGD. So plans to defer EGD at this time since she is stable with no active bleeding -Tolerating clear liquids well at this time. -No active bleeding at this time -coumadin is  held  #2 acute blood loss anemia-drop in hemoglobin noted from 12- 7 on adm -Received 1 unit packed RBC transfusion on admission, Coumadin held.  -Slight drop in hemoglobin noted today. Monitor carefully -Monitor hemoglobin and transfuse if hemodynamically unstable or hemoglobin less than 7. -Hold Coumadin.   #3 NSTEMI- troponin slightly elevated, patient also presented with chest pain. Could be demand ischemia from rapid drop in  hemoglobin. -Denies any chest pain at this time. EKG without any acute changes -Echocardiogram with no wall motion abnormalities.  - Appreciate cardiology consult. . She is a moderate to high risk for procedure at this time according to cardiology. -Troponin is still elevated, but plateaued at this time -Hold off on aspirin, can start statin  #4 peripheral vascular disease-had arterial thrombus on Coumadin-about 4 years ago -Since then has had bypass of lower extremities and decent arterial duplex on last year exam. -We will hold Coumadin for now. Follow up with vascular as outpatient  #5 acute hypoxia-secondary to pulmonary vascular congestion. Likely diastolic dysfunction. Given a dose of Lasix and restarted on Lasix back  #6 CK D stage IV-stable. Baseline creatinine seems to be around 2  #7 DVT prophylaxis-Ted's and SCDs only at this time   Encourage ambulation   All the records are reviewed and case discussed with Care Management/Social Workerr. Management plans discussed with the patient, family and they are in agreement.  CODE STATUS: Full code  TOTAL TIME TAKING CARE OF THIS PATIENT: 37 minutes.   POSSIBLE D/C TOMORROW, DEPENDING ON CLINICAL CONDITION.   Gladstone Lighter M.D on 10/19/2017 at 3:06 PM  Between 7am to 6pm - Pager - [phone]  After 6pm go to [email or web address] - password [email or web address] [facility]  Office  [phone]  CC: Primary care physician; Chrismon, Vickki Muff, PA

## 2017-10-19 NOTE — Progress Notes (Signed)
[]  Hide copied text  [] Hover for details   Ambulated around nurses station times 1. Pt very winded, HR up to 118, sats down to 81% ambulating.  Pt back to room in chair, oxygenation recovered to 93%.  Will  Monitor.

## 2017-10-19 NOTE — Plan of Care (Signed)
  Progressing Education: Knowledge of General Education information will improve 10/19/2017 1229 - Progressing by Etheleen Nicks, RN Health Behavior/Discharge Planning: Ability to manage health-related needs will improve 10/19/2017 1229 - Progressing by Etheleen Nicks, RN Clinical Measurements: Will remain free from infection 10/19/2017 1229 - Progressing by Etheleen Nicks, RN Nutrition: Adequate nutrition will be maintained 10/19/2017 1229 - Progressing by Etheleen Nicks, RN Note Diet advanced to Full liquids Safety: Ability to remain free from injury will improve 10/19/2017 1229 - Progressing by Etheleen Nicks, RN Note Fall precautions in place, non skid socks when oob   Not Progressing Clinical Measurements: Respiratory complications will improve 10/19/2017 1229 - Not Progressing by Etheleen Nicks, RN Note Remains on po Lasix, lungs with crackles today, extra IV dose of Lasix given

## 2017-10-20 ENCOUNTER — Encounter: Payer: Self-pay | Admitting: *Deleted

## 2017-10-20 ENCOUNTER — Telehealth: Payer: Self-pay

## 2017-10-20 LAB — CBC
HEMATOCRIT: 24 % — AB (ref 35.0–47.0)
Hemoglobin: 8 g/dL — ABNORMAL LOW (ref 12.0–16.0)
MCH: 30.3 pg (ref 26.0–34.0)
MCHC: 33.3 g/dL (ref 32.0–36.0)
MCV: 91.1 fL (ref 80.0–100.0)
Platelets: 148 10*3/uL — ABNORMAL LOW (ref 150–440)
RBC: 2.63 MIL/uL — ABNORMAL LOW (ref 3.80–5.20)
RDW: 15.9 % — AB (ref 11.5–14.5)
WBC: 5.2 10*3/uL (ref 3.6–11.0)

## 2017-10-20 LAB — BASIC METABOLIC PANEL
ANION GAP: 7 (ref 5–15)
BUN: 25 mg/dL — ABNORMAL HIGH (ref 6–20)
CALCIUM: 8.1 mg/dL — AB (ref 8.9–10.3)
CHLORIDE: 107 mmol/L (ref 101–111)
CO2: 25 mmol/L (ref 22–32)
CREATININE: 1.75 mg/dL — AB (ref 0.44–1.00)
GFR calc Af Amer: 29 mL/min — ABNORMAL LOW (ref >60)
GFR calc non Af Amer: 25 mL/min — ABNORMAL LOW (ref >60)
GLUCOSE: 105 mg/dL — AB (ref 65–99)
Potassium: 3.6 mmol/L (ref 3.5–5.1)
Sodium: 139 mmol/L (ref 135–145)

## 2017-10-20 LAB — HEMOGLOBIN: Hemoglobin: 9.1 g/dL — ABNORMAL LOW (ref 12.0–16.0)

## 2017-10-20 MED ORDER — FUROSEMIDE 40 MG PO TABS
40.0000 mg | ORAL_TABLET | Freq: Every day | ORAL | Status: DC
  Administered 2017-10-20: 40 mg via ORAL
  Filled 2017-10-20: qty 1

## 2017-10-20 MED ORDER — PANTOPRAZOLE SODIUM 40 MG PO TBEC: 40.0000 mg | DELAYED_RELEASE_TABLET | Freq: Two times a day (BID) | ORAL | 1 refills | Status: DC

## 2017-10-20 NOTE — Progress Notes (Signed)
Patient discharged home via volunteer, and wheelchair, with family. Pt and family aware of follow up appointments, and new medications needing pick up at pharmacy. Pt has no complaints or concerns at this time.

## 2017-10-20 NOTE — Telephone Encounter (Signed)
[facility] called stating that patient was being discharged from hospital today. Hospital follow up has been scheduled for 11/05/17. KW

## 2017-10-20 NOTE — Discharge Summary (Signed)
[facility] NAME: Lori Johnson    MR#:  [id number]  DATE OF BIRTH:  January 16, 1930  DATE OF ADMISSION:  10/16/2017   ADMITTING PHYSICIAN: Gladstone Lighter, MD  DATE OF DISCHARGE: 10/20/2017  3:54 PM  PRIMARY CARE PHYSICIAN: Chrismon, Vickki Muff, PA   ADMISSION DIAGNOSIS:   Gastrointestinal hemorrhage, unspecified gastrointestinal hemorrhage type [K92.2] Hematemesis with nausea [K92.0] Anemia, unspecified type [D64.9]  DISCHARGE DIAGNOSIS:   Active Problems:   GI bleed   SECONDARY DIAGNOSIS:   Past Medical History:  Diagnosis Date  . Angiopathy, peripheral ([place])   . Arterial embolus and thrombosis ([place])   . ASCVD (arteriosclerotic cardiovascular disease)    with stent RCA  . Bilateral bunions   . Cervical lymphadenitis   . Chronic gout of left ankle due to renal impairment without tophus   . Chronic infection June 2014   Has chronic infection of right femoral graft with history of extensive debridement in June 2014. Was on oral Clindamycin for 3 weeks for culture positive for anerobes and staph lugdunensis. Vascular Surgeon at [facility] Remonia Richter, MD) recommended indefinite suppressive therapy with Cephalexin 500 mg qd and reassessment every 3 months. Will refill Cephalexin.)  . Chronic kidney disease   . CRF (chronic renal failure)   . GERD (gastroesophageal reflux disease)   . Hiatal hernia   . History of arterial bypass of lower extremity   . Hyperlipidemia   . Hypertension   . Hyperuricemia   . Macular degeneration   . Mass of right side of neck   . Nocturia   . OA (osteoarthritis)   . PVD (peripheral vascular disease) ([place])   . Renal disorder     HOSPITAL COURSE:   Lori Johnson a83 y.o.femalewith a known history of CAD status post stent, peripheral arterial disease status post bypass surgery after an arterial clot and on Coumadin, chronic gout, CKD, hypertension, arthritis, hyperlipidemia, GERD presents to  hospital secondary to several episodes of hematemesis that started early this morning.  #1 Upper GI bleed-hematemesis on adm,  -received 1 unit packed RBC transfusion on adm,  -Hemoglobin has been stable around 9. No further active bleeding noted.  -GI consult is appreciated. Due to her NSTEMI, she is a moderate to high risk for EGD. So plans to defer EGD at this time since she is stable with no active bleeding -coumadinis  held -Patient's diet has been slowly advanced and she is tolerating solid diet well at this time. -Outpatient follow-up with GI recommended. She will be discharged on Protonix twice a day  #2 acute blood loss anemia- secondary to GI bleed. Did receive 1 unit packed RBC transfusion during this admission. -Baseline hemoglobin is at 12, hemoglobin at 9 at discharge. -Hold Coumadin.   #3 NSTEMI- troponin slightly elevated, patient also presented with chest pain. Could be demand ischemia from rapid drop in hemoglobin. -Denies any chest pain at this time. EKG without any acute changes -Echocardiogram with no wall motion abnormalities. She does have moderate to severe aortic stenosis - Appreciate cardiology consult. . She is a moderate to high risk for procedure at this time according to cardiology. -Troponin is still elevated, but plateaued at this time -Hold off on aspirin, can continue statin  #4 peripheral vascular disease-had arterial thrombus on Coumadin-about 4 years ago -Since then has had bypass of lower extremities and decent arterial duplex on last year exam. -We will hold Coumadin for now. Follow up with vascular as outpatient  #  5 acute hypoxia-secondary to pulmonary vascular congestion. Likely diastolic dysfunction.  -Since her home dose of Lasix was held on admission and she was also received blood and fluids, develop pulmonary vascular congestion. -Received IV Lasix yesterday and oral Lasix today. Restart her home dose of Lasix every other day. She is off  the oxygen and breathing is much better  #6 CK D stage IV-stable. Baseline creatinine seems to be around 2  #7 hypertension-continue home medications of Norvasc, losartan and hydrochlorothiazide  Patient will be discharged today    DISCHARGE CONDITIONS:   Guarded  CONSULTS OBTAINED:   Treatment Team:  Virgel Manifold, MD Yolonda Kida, MD  DRUG ALLERGIES:   Allergies  Allergen Reactions  . Pletal [Cilostazol] Swelling  . Erythromycin Hives  . Codeine Nausea Only and Nausea And Vomiting  . Erythromycin Base Itching and Rash  . Penicillins Hives, Itching and Rash  . Sulfa Antibiotics Rash   DISCHARGE MEDICATIONS:   Allergies as of 10/20/2017      Reactions   Pletal [cilostazol] Swelling   Erythromycin Hives   Codeine Nausea Only, Nausea And Vomiting   Erythromycin Base Itching, Rash   Penicillins Hives, Itching, Rash   Sulfa Antibiotics Rash      Medication List    STOP taking these medications   cephALEXin 500 MG capsule Commonly known as:  KEFLEX   multivitamin capsule   ranitidine 150 MG tablet Commonly known as:  ZANTAC   warfarin 5 MG tablet Commonly known as:  COUMADIN     TAKE these medications   amLODipine 10 MG tablet Commonly known as:  NORVASC TAKE 1 TABLET EVERY DAY   cholecalciferol 1000 units tablet Commonly known as:  VITAMIN D Take by mouth daily.   diclofenac sodium 1 % Gel Commonly known as:  VOLTAREN Apply 2 g topically 4 (four) times daily.   furosemide 40 MG tablet Commonly known as:  LASIX Take 40 mg every other day by mouth.   hydrocortisone valerate cream 0.2 % Commonly known as:  WESTCORT   ICAPS AREDS 2 PO Take by mouth.   losartan-hydrochlorothiazide 50-12.5 MG tablet Commonly known as:  HYZAAR TAKE 1 TABLET EVERY DAY   pantoprazole 40 MG tablet Commonly known as:  PROTONIX Take 1 tablet (40 mg total) by mouth 2 (two) times daily before a meal. Take twice a day for 2 weeks and then once a day     pravastatin 20 MG tablet Commonly known as:  PRAVACHOL TAKE 1 TABLET AT BEDTIME   TYLENOL ARTHRITIS PAIN PO Take by mouth.        DISCHARGE INSTRUCTIONS:   1. PCP follow-up in 1-2 weeks 2. GI follow up in 2 weeks 3. Cardiology follow up in 2-4 weeks, also monitor aortic valve stenosis with outpatient echocardiograms  DIET:   Cardiac diet  ACTIVITY:   Activity as tolerated  OXYGEN:   Home Oxygen: No.  Oxygen Delivery: room air  DISCHARGE LOCATION:   home   If you experience worsening of your admission symptoms, develop shortness of breath, life threatening emergency, suicidal or homicidal thoughts you must seek medical attention immediately by calling 911 or calling your MD immediately  if symptoms less severe.  You Must read complete instructions/literature along with all the possible adverse reactions/side effects for all the Medicines you take and that have been prescribed to you. Take any new Medicines after you have completely understood and accpet all the possible adverse reactions/side effects.   Please note  You were cared for by a hospitalist during your hospital stay. If you have any questions about your discharge medications or the care you received while you were in the hospital after you are discharged, you can call the unit and asked to speak with the hospitalist on call if the hospitalist that took care of you is not available. Once you are discharged, your primary care physician will handle any further medical issues. Please note that NO REFILLS for any discharge medications will be authorized once you are discharged, as it is imperative that you return to your primary care physician (or establish a relationship with a primary care physician if you do not have one) for your aftercare needs so that they can reassess your need for medications and monitor your lab values.    On the day of Discharge:  VITAL SIGNS:   Blood pressure (!) 143/45, pulse 74,  temperature 97.7 F (36.5 C), temperature source Oral, resp. rate 14, height 5\' 4"  (1.626 m), weight 84.2 kg (185 lb 10 oz), SpO2 94 %.  PHYSICAL EXAMINATION:    GENERAL:  83 y.o.-year-old patient lying in the bed with no acute distress.  EYES: Pupils equal, round, reactive to light and accommodation. No scleral icterus. Extraocular muscles intact.  HEENT: Head atraumatic, normocephalic. Oropharynx and nasopharynx clear.  NECK:  Supple, no jugular venous distention. No thyroid enlargement, no tenderness.  LUNGS: Normal breath sounds bilaterally, no wheezing, rales,rhonchi or crepitation. No use of accessory muscles of respiration. Decreased bibasilar breath sounds noted CARDIOVASCULAR: S1, S2 normal. No   rubs, or gallops. 2/6 systolic murmur present ABDOMEN: Soft, non-tender, non-distended. Bowel sounds present. No organomegaly or mass.  EXTREMITIES: No pedal edema, cyanosis, or clubbing.  NEUROLOGIC: Cranial nerves II through XII are intact. Muscle strength 5/5 in all extremities. Sensation intact. Gait not checked.  PSYCHIATRIC: The patient is alert and oriented x 3.  SKIN: No obvious rash, lesion, or ulcer.   DATA REVIEW:   CBC Recent Labs  Lab 10/20/17 0306 10/20/17 0951  WBC 5.2  --   HGB 8.0* 9.1*  HCT 24.0*  --   PLT 148*  --     Chemistries  Recent Labs  Lab 10/16/17 0604 10/17/17 0513  10/20/17 0306  NA 137 139   < > 139  K 4.1 4.1   < > 3.6  CL 104 110   < > 107  CO2 24 23   < > 25  GLUCOSE 146* 96   < > 105*  BUN 49* 45*   < > 25*  CREATININE 1.96* 1.65*   < > 1.75*  CALCIUM 9.4 8.6*   < > 8.1*  MG  --  1.5*  --   --   AST 24  --   --   --   ALT 12*  --   --   --   ALKPHOS 46  --   --   --   BILITOT 0.9  --   --   --    < > = values in this interval not displayed.     Microbiology Results  Results for orders placed or performed during the hospital encounter of 10/16/17  MRSA PCR Screening     Status: None   Collection Time: 10/16/17 12:40 PM    Result Value Ref Range Status   MRSA by PCR NEGATIVE NEGATIVE Final    Comment:        The GeneXpert MRSA Assay (FDA approved for NASAL specimens  only), is one component of a comprehensive MRSA colonization surveillance program. It is not intended to diagnose MRSA infection nor to guide or monitor treatment for MRSA infections.     RADIOLOGY:  No results found.   Management plans discussed with the patient, family and they are in agreement.  CODE STATUS:     Code Status Orders  (From admission, onward)        Start     Ordered   10/16/17 1238  Full code  Continuous     10/16/17 1237    Code Status History    Date Active Date Inactive Code Status Order ID Comments User Context   This patient has a current code status but no historical code status.    Advance Directive Documentation     Most Recent Value  Type of Advance Directive  Living will, Healthcare Power of Attorney  Pre-existing out of facility DNR order (yellow form or pink MOST form)  No data  "MOST" Form in Place?  No data      TOTAL TIME TAKING CARE OF THIS PATIENT: 38 minutes.    Gladstone Lighter M.D on 10/20/2017 at 4:59 PM  Between 7am to 6pm - Pager - [phone]  After 6pm go to [email or web address] - Proofreader  [employer]  Hospitalists  Office  [phone]  CC: Primary care physician; Margo Common, PA   Note: This dictation was prepared with Dragon dictation along with smaller phrase technology. Any transcriptional errors that result from this process are unintentional.

## 2017-10-20 NOTE — Progress Notes (Signed)
Pt. Ambulated around nurses station, x 2 this morning, on room air. Pt. Is slightly short of breath oxygen sats remained above 90%. Pt has no complaints this morning, and VSS with exclusion of elevated SBP.

## 2017-10-20 NOTE — Care Management (Signed)
Attending stated that patient does not have need for home oxygen and ambulating well without need for home physical therapy

## 2017-10-20 NOTE — Care Management Important Message (Signed)
Important Message  Patient Details  Name: Lori Johnson MRN: [id number] Date of Birth: July 22, 1930   Medicare Important Message Given:  Yes Signed IM notice given    Katrina Stack, RN 10/20/2017, 3:07 PM

## 2017-10-28 ENCOUNTER — Other Ambulatory Visit: Payer: Self-pay

## 2017-10-28 ENCOUNTER — Ambulatory Visit: Payer: Medicare PPO | Admitting: Gastroenterology

## 2017-10-28 ENCOUNTER — Encounter: Payer: Self-pay | Admitting: Gastroenterology

## 2017-10-28 VITALS — BP 127/61 | HR 88 | Temp 97.8°F | Ht 64.0 in | Wt 183.4 lb

## 2017-10-28 DIAGNOSIS — K920 Hematemesis: Secondary | ICD-10-CM

## 2017-10-28 DIAGNOSIS — D62 Acute posthemorrhagic anemia: Secondary | ICD-10-CM

## 2017-10-28 MED ORDER — PANTOPRAZOLE SODIUM 40 MG PO TBEC: 40.0000 mg | DELAYED_RELEASE_TABLET | Freq: Two times a day (BID) | ORAL | 1 refills | Status: DC

## 2017-10-28 NOTE — Patient Instructions (Signed)
Take PPI twice daily for 5 more weeks then once a day Follow up with Vascular Surgery, Cardiology and Nephrology as scheduled Please inform us of any signs of blood in stool or black stool, dizziness, fatigue, blood in vomit or go to the ER immediately Follow the anti-reflux recommendations discussed with you

## 2017-10-28 NOTE — Progress Notes (Addendum)
Lori Antigua, MD [address], [place], [place], [place], [address] [address], [place], [place], [place], [address] Phone: [phone]  Fax: [phone]  Consultation  Referring Provider:     Margo Common, PA Primary Care Physician:  Margo Common, PA Primary Gastroenterologist:  Virgel Manifold, MD        Reason for Consultation:   Hospital follow-up, GI bleed  Date of Consultation:  10/28/2017         HPI:   Lori Johnson is a 83 y.o. female who was recently admitted to the hospital on November 17 due to hematemesis.  Patient was on Coumadin, with a supratherapeutic INR at 3.2, due to peripheral vascular disease and stenting.  During the admission, her hematemesis resolved with IV PPI and correction of INR, and due to an elevated troponin to 4.5 acutely during the admission, endoscopy was high risk and was not performed.  Patient's Coumadin has been held since the admission.  Her hemoglobin at discharge was 9.1 and was 7.6 when she was admitted.  She was seen by cardiology who recommended conservative management for N STEMI.  She has outpatient follow-up with vascular surgery next week.  She denies any melena, hematochezia, hematemesis, chest pain since being home.  Past Medical History:  Diagnosis Date  . Angiopathy, peripheral ([place])   . Arterial embolus and thrombosis ([place])   . ASCVD (arteriosclerotic cardiovascular disease)    with stent RCA  . Bilateral bunions   . Cervical lymphadenitis   . Chronic gout of left ankle due to renal impairment without tophus   . Chronic infection June 2014   Has chronic infection of right femoral graft with history of extensive debridement in June 2014. Was on oral Clindamycin for 3 weeks for culture positive for anerobes and staph lugdunensis. Vascular Surgeon at [facility] Remonia Richter, MD) recommended indefinite suppressive therapy with Cephalexin 500 mg qd and reassessment every 3 months. Will refill Cephalexin.)  . Chronic  kidney disease   . CRF (chronic renal failure)   . GERD (gastroesophageal reflux disease)   . Hiatal hernia   . History of arterial bypass of lower extremity   . Hyperlipidemia   . Hypertension   . Hyperuricemia   . Macular degeneration   . Mass of right side of neck   . Nocturia   . OA (osteoarthritis)   . PVD (peripheral vascular disease) ([place])   . Renal disorder     Past Surgical History:  Procedure Laterality Date  . ABDOMINAL HYSTERECTOMY  1981  . arterial bypass right leg Right   . broken left foot Left 01/2002  . CHOLECYSTECTOMY    . CORONARY ANGIOPLASTY WITH STENT PLACEMENT    . stent in leg      Prior to Admission medications   Medication Sig Start Date End Date Taking? Authorizing Provider  Acetaminophen (TYLENOL ARTHRITIS PAIN PO) Take by mouth.   Yes [provider]  amLODipine (NORVASC) 10 MG tablet  09/14/17  Yes [provider]  calcipotriene (DOVONOX) 0.005 % cream Apply twice daily to forehead, temples, cheeks and nose for 4-5 days 09/28/17  Yes [provider]  cephALEXin (KEFLEX) 500 MG capsule  09/04/17  Yes [provider]  fluorouracil (EFUDEX) 5 % cream Apply twice daily to forehead, temples, cheeks and nose for 4-5 days 09/28/17  Yes [provider]  furosemide (LASIX) 40 MG tablet Take 40 mg every other day by mouth.  04/02/15  Yes [provider]  hydrocortisone valerate cream (WESTCORT) 0.2 %  08/26/16  Yes [provider]  losartan-hydrochlorothiazide (HYZAAR) 50-12.5 MG tablet TAKE 1 TABLET EVERY DAY 01/29/17  Yes Chrismon, [name] E, PA  mometasone (ELOCON) 0.1 % ointment APPLY TWICE DAILY TO AFFECTED AREAS ON LEGS 07/23/17  Yes [provider]  Multiple Vitamins-Minerals (ICAPS AREDS 2 PO) Take by mouth.   Yes [provider]  pravastatin (PRAVACHOL) 20 MG tablet TAKE 1 TABLET AT BEDTIME 02/15/17  Yes Chrismon, Vickki Muff, PA  diclofenac sodium (VOLTAREN) 1 % GEL Apply 2 g  topically 4 (four) times daily. Patient not taking: Reported on 10/16/2017 10/23/16   Chrismon, Vickki Muff, PA  pantoprazole (PROTONIX) 40 MG tablet Take 1 tablet (40 mg total) by mouth 2 (two) times daily before a meal. Take twice a day for 5 weeks and then once a day 10/28/17   Virgel Manifold, MD    Family History  Problem Relation Age of Onset  . Heart disease Mother   . Heart disease Father      Social History   Tobacco Use  . Smoking status: Former Smoker    Types: Cigarettes  . Smokeless tobacco: Never Used  . Tobacco comment: quit >30-40 years ago  Substance Use Topics  . Alcohol use: No  . Drug use: No    Allergies as of 10/28/2017 - Review Complete 10/28/2017  Allergen Reaction Noted  . Pletal [cilostazol] Swelling 03/31/2015  . Erythromycin Hives 03/31/2015  . Codeine Nausea Only and Nausea And Vomiting 03/31/2015  . Erythromycin base Itching and Rash 05/06/2015  . Penicillins Hives, Itching, and Rash 03/31/2015  . Sulfa antibiotics Rash 03/31/2015    Review of Systems:    All systems reviewed and negative except where noted in HPI.   Physical Exam:  Vital signs in last 24 hours: Vitals:   10/28/17 0938  BP: 127/61  Pulse: 88  Temp: 97.8 F (36.6 C)  TempSrc: Oral  Weight: 83.2 kg (183 lb 6.4 oz)  Height: 5\' 4"  (1.626 m)     General:   Pleasant, cooperative in NAD Head:  Normocephalic and atraumatic. Eyes:   No icterus.   Conjunctiva pink. PERRLA. Ears:  Normal auditory acuity. Neck:  Supple; no masses or thyroidomegaly Lungs: Respirations even and unlabored. Lungs clear to auscultation bilaterally.   No wheezes, crackles, or rhonchi.  Heart:  Regular rate and rhythm;  Without murmur, clicks, rubs or gallops Abdomen:  Soft, nondistended, nontender. Normal bowel sounds. No appreciable masses or hepatomegaly.  No rebound or guarding.  Neurologic:  Alert and oriented x3;  grossly normal neurologically. Skin:  Intact without significant lesions or  rashes. Cervical Nodes:  No significant cervical adenopathy. Psych:  Alert and cooperative. Normal affect.  LAB RESULTS: Labs from November 21 reviewed hemoglobin 9.1. STUDIES: No results found.    Impression / Plan:   Lori Johnson is a 83 y.o. y/o female presents for hospital follow-up for hematemesis in the setting of supratherapeutic INR from Coumadin for peripheral vascular disease, with GI bleed resolved with conservative management of IV PPI  Patient presents with her 2 daughters who help take care of her, and the entire discussions below occurred in agreement with them.  Endoscopy could not be performed during her inpatient visit due to NSTEMI with troponin of 4.5 She was managed conservatively by cardiology during the admission as well Would recommend continuing PPI twice daily for a total of 8 weeks (5 more weeks left), and then once daily for another  4-6 weeks. However, if patient asked to resume her Coumadin after discussing with vascular surgery next week, she may need lifelong PPI. Patient remains high risk for endoscopic procedures given her recent NSTEMI.  In the setting of no active bleeding, and resolution of her GI bleed, endoscopic procedures would entail higher risk than benefits at this time. If vascular surgery, would like to resume Coumadin, and endoscopic procedures are needed, patient would need cardiac clearance prior to the procedure. I have encouraged patient to definitely follow-up with cardiology, vascular surgery and nephrology as scheduled. She is not taking any NSAIDs, and I have discussed never taking these, except for aspirin if her primary care or cardiology recommends this. Diclofenac gel as listed on her medication list, but family states the last time she used it was months ago and only uses it sparingly.  Her history of CKD is noted.  I extensively discussed risks and benefits of PPI use with the patient, and given the recent GI bleed, PPI use at this  time would have high of benefits and risks.  Goal would be to maintain at lowest dose possible long-term.  If Coumadin is not resumed by vascular surgery, we can eventually take this medication off.  However, if adverse effects occur, for example, if creatinine is worsened, then risks would outweigh benefits at that time and the medication can be discontinued.  At this time twice a day dosing is appropriate for another 5 weeks (which would make a total of 8 weeks) to help any underlying peptic ulcer disease or esophagitis that led to her GI bleed heal. Risks of PPI use were discussed with patient including bone loss, C. Diff diarrhea, pneumonia, infections, CKD, electrolyte abnormalities. Pt. Verbalizes understanding and chooses to continue the medication.  Patient was informed of alarm symptoms to inform us with her to go to the ER with.  These include but are not limited to hematemesis, dizziness, fatigue, blood in stool, melena, abdominal pain, or any other reason for concern   Thank you for involving me in the care of this patient.    Virgel Manifold, MD  10/28/2017, 10:08 AM

## 2017-10-29 ENCOUNTER — Ambulatory Visit: Payer: Medicare PPO | Admitting: Family Medicine

## 2017-10-29 ENCOUNTER — Encounter: Payer: Self-pay | Admitting: Family Medicine

## 2017-10-29 ENCOUNTER — Ambulatory Visit: Payer: Self-pay

## 2017-10-29 VITALS — BP 170/52 | HR 55 | Temp 98.1°F | Wt 183.2 lb

## 2017-10-29 DIAGNOSIS — Z86718 Personal history of other venous thrombosis and embolism: Secondary | ICD-10-CM | POA: Diagnosis not present

## 2017-10-29 DIAGNOSIS — N183 Chronic kidney disease, stage 3 unspecified: Secondary | ICD-10-CM

## 2017-10-29 DIAGNOSIS — K922 Gastrointestinal hemorrhage, unspecified: Secondary | ICD-10-CM | POA: Diagnosis not present

## 2017-10-29 LAB — CBC WITH DIFFERENTIAL/PLATELET
BASOS PCT: 0.5 %
Basophils Absolute: 33 cells/uL (ref 0–200)
EOS PCT: 4 %
Eosinophils Absolute: 260 cells/uL (ref 15–500)
HCT: 28.5 % — ABNORMAL LOW (ref 35.0–45.0)
Hemoglobin: 9.4 g/dL — ABNORMAL LOW (ref 11.7–15.5)
LYMPHS ABS: 1424 {cells}/uL (ref 850–3900)
MCH: 29.7 pg (ref 27.0–33.0)
MCHC: 33 g/dL (ref 32.0–36.0)
MCV: 90.2 fL (ref 80.0–100.0)
MPV: 11.1 fL (ref 7.5–12.5)
Monocytes Relative: 11.4 %
NEUTROS PCT: 62.2 %
Neutro Abs: 4043 cells/uL (ref 1500–7800)
PLATELETS: 314 10*3/uL (ref 140–400)
RBC: 3.16 10*6/uL — AB (ref 3.80–5.10)
RDW: 14 % (ref 11.0–15.0)
TOTAL LYMPHOCYTE: 21.9 %
WBC: 6.5 10*3/uL (ref 3.8–10.8)
WBCMIX: 741 {cells}/uL (ref 200–950)

## 2017-10-29 LAB — COMPLETE METABOLIC PANEL WITH GFR
AG Ratio: 1.4 (calc) (ref 1.0–2.5)
ALBUMIN MSPROF: 3.6 g/dL (ref 3.6–5.1)
ALKALINE PHOSPHATASE (APISO): 62 U/L (ref 33–130)
ALT: 10 U/L (ref 6–29)
AST: 19 U/L (ref 10–35)
BILIRUBIN TOTAL: 0.7 mg/dL (ref 0.2–1.2)
BUN / CREAT RATIO: 12 (calc) (ref 6–22)
BUN: 23 mg/dL (ref 7–25)
CHLORIDE: 100 mmol/L (ref 98–110)
CO2: 26 mmol/L (ref 20–32)
CREATININE: 1.98 mg/dL — AB (ref 0.60–0.88)
Calcium: 9.1 mg/dL (ref 8.6–10.4)
GFR, Est African American: 26 mL/min/{1.73_m2} — ABNORMAL LOW (ref >=60)
GFR, Est Non African American: 22 mL/min/{1.73_m2} — ABNORMAL LOW (ref >=60)
GLUCOSE: 109 mg/dL — AB (ref 65–99)
Globulin: 2.6 g/dL (calc) (ref 1.9–3.7)
Potassium: 4 mmol/L (ref 3.5–5.3)
SODIUM: 141 mmol/L (ref 135–146)
Total Protein: 6.2 g/dL (ref 6.1–8.1)

## 2017-10-29 LAB — POCT INR: INR: 1.3

## 2017-10-29 NOTE — Patient Instructions (Signed)
Description   Stay off Coumadin until follow up with cardiologist. No follow up appointment scheduled at this time.

## 2017-10-29 NOTE — Progress Notes (Signed)
Patient: Lori Johnson Female    DOB: Dec 09, 1929   83 y.o.   MRN: [id number] Visit Date: 10/29/2017  Today's Provider: [name], PA   Chief Complaint  Patient presents with  . Hospitalization Follow-up   Subjective:    HPI  Follow up Hospitalization  Patient was admitted to [facility] on 10/16/17 and discharged on 10/20/17. She was treated for hematemesis with nausea. Treatment for this included started Pantoprazole and discontinue ranitidine,warfarin,multivitamin, and cephalexin. She reports excellent compliance with treatment. She reports this condition is Improved, but feels weak. Patient had a follow up visit with GI on 10/28/17. She changed Pantoprazole 40 mg to BID X 6 weeks then once daily after that.   ------------------------------------------------------------------------------------  Past Medical History:  Diagnosis Date  . Angiopathy, peripheral ([place])   . Arterial embolus and thrombosis ([place])   . ASCVD (arteriosclerotic cardiovascular disease)    with stent RCA  . Bilateral bunions   . Cervical lymphadenitis   . Chronic gout of left ankle due to renal impairment without tophus   . Chronic infection June 2014   Has chronic infection of right femoral graft with history of extensive debridement in June 2014. Was on oral Clindamycin for 3 weeks for culture positive for anerobes and staph lugdunensis. Vascular Surgeon at [facility] Remonia Richter, MD) recommended indefinite suppressive therapy with Cephalexin 500 mg qd and reassessment every 3 months. Will refill Cephalexin.)  . Chronic kidney disease   . CRF (chronic renal failure)   . GERD (gastroesophageal reflux disease)   . Hiatal hernia   . History of arterial bypass of lower extremity   . Hyperlipidemia   . Hypertension   . Hyperuricemia   . Macular degeneration   . Mass of right side of neck   . Nocturia   . OA (osteoarthritis)   . PVD (peripheral vascular disease) ([place])   . Renal disorder    Patient Active  Problem List   Diagnosis Date Noted  . GI bleed 10/16/2017  . Closed fracture of tibial plateau 06/24/2015  . Current tear knee, medial meniscus 06/24/2015  . Arthritis of knee, degenerative 06/24/2015  . Angiopathy, peripheral ([place]) 06/06/2015  . Arterial vascular disease 06/06/2015  . Bunion 06/06/2015  . Cervical adenitis 06/06/2015  . Gout due to renal impairment of multiple sites 06/06/2015  . Chronic infection 06/06/2015  . Contusion of forearm 06/06/2015  . Arm bruise 06/06/2015  . Asthma, cough variant 06/06/2015  . Gastroesophageal reflux disease with hiatal hernia 06/06/2015  . History of DVT of lower extremity 06/06/2015  . Bergmann's syndrome 06/06/2015  . H/O arterial bypass of lower limb 06/06/2015  . HLD (hyperlipidemia) 06/06/2015  . Benign hypertension 06/06/2015  . Elevated blood uric acid level 06/06/2015  . Disorder of kidney 06/06/2015  . Lump in neck 06/06/2015  . Excessive urination at night 06/06/2015  . Arthritis, degenerative 06/06/2015  . Poor balance 06/06/2015  . Peripheral blood vessel disorder ([place]) 06/06/2015  . Acid reflux 03/08/2013  . Atherosclerosis of native artery of extremity ([place]) 01/22/2013  . Chronic kidney disease 01/22/2013  . CAD in native artery 01/22/2013  . Essential (primary) hypertension 01/22/2013  . Atherosclerosis of native arteries of extremity with rest pain ([place]) 01/22/2013  . Arthralgia of hip or thigh 03/29/2006   Past Surgical History:  Procedure Laterality Date  . ABDOMINAL HYSTERECTOMY  1981  . arterial bypass right leg Right   . broken left foot Left 01/2002  . CHOLECYSTECTOMY    .  CORONARY ANGIOPLASTY WITH STENT PLACEMENT    . stent in leg     Family History  Problem Relation Age of Onset  . Heart disease Mother   . Heart disease Father    Allergies  Allergen Reactions  . Pletal [Cilostazol] Swelling  . Erythromycin Hives  . Codeine Nausea Only and Nausea And Vomiting  . Erythromycin Base Itching and  Rash  . Penicillins Hives, Itching and Rash  . Sulfa Antibiotics Rash    Current Outpatient Medications:  .  Acetaminophen (TYLENOL ARTHRITIS PAIN PO), Take by mouth., Disp: , Rfl:  .  amLODipine (NORVASC) 10 MG tablet, , Disp: , Rfl:  .  furosemide (LASIX) 40 MG tablet, Take 40 mg every other day by mouth. , Disp: , Rfl:  .  hydrocortisone valerate cream (WESTCORT) 0.2 %, , Disp: , Rfl:  .  losartan-hydrochlorothiazide (HYZAAR) 50-12.5 MG tablet, TAKE 1 TABLET EVERY DAY, Disp: 90 tablet, Rfl: 3 .  mometasone (ELOCON) 0.1 % ointment, APPLY TWICE DAILY TO AFFECTED AREAS ON LEGS, Disp: , Rfl: 1 .  pantoprazole (PROTONIX) 40 MG tablet, Take 1 tablet (40 mg total) by mouth 2 (two) times daily before a meal. Take twice a day for 5 weeks and then once a day, Disp: 60 tablet, Rfl: 1 .  pravastatin (PRAVACHOL) 20 MG tablet, TAKE 1 TABLET AT BEDTIME, Disp: 90 tablet, Rfl: 3 .  calcipotriene (DOVONOX) 0.005 % cream, Apply twice daily to forehead, temples, cheeks and nose for 4-5 days, Disp: , Rfl: 0 .  diclofenac sodium (VOLTAREN) 1 % GEL, Apply 2 g topically 4 (four) times daily. (Patient not taking: Reported on 10/16/2017), Disp: 100 g, Rfl: 0  Review of Systems  Constitutional: Negative.   HENT: Negative.   Respiratory: Negative.   Cardiovascular: Negative.   Gastrointestinal: Negative.   Genitourinary: Negative.     Social History   Tobacco Use  . Smoking status: Former Smoker    Types: Cigarettes  . Smokeless tobacco: Never Used  . Tobacco comment: quit >30-40 years ago  Substance Use Topics  . Alcohol use: No   Objective:   BP (!) 170/52 (BP Location: Right Arm, Patient Position: Sitting, Cuff Size: Normal) Comment: pt reports not taking any medications this morning  Pulse (!) 55   Temp 98.1 F (36.7 C) (Oral)   Wt 183 lb 3.2 oz (83.1 kg)   SpO2 96%   BMI 31.45 kg/m    Physical Exam  Constitutional: She is oriented to person, place, and time. She appears well-developed and  well-nourished. No distress.  HENT:  Head: Normocephalic and atraumatic.  Right Ear: Hearing normal.  Left Ear: Hearing normal.  Nose: Nose normal.  Eyes: Conjunctivae and lids are normal. Right eye exhibits no discharge. Left eye exhibits no discharge. No scleral icterus.  Pulmonary/Chest: Effort normal. No respiratory distress.  Musculoskeletal: Normal range of motion.  Neurological: She is alert and oriented to person, place, and time.  Skin: Skin is intact. No lesion and no rash noted.  Psychiatric: She has a normal mood and affect. Her speech is normal and behavior is normal. Thought content normal.      Assessment & Plan:     1. Gastrointestinal hemorrhage, unspecified gastrointestinal hemorrhage type Hospitalized 10-16-17 through 10-21-17 for severe GI bleed/hematemesis. Transfused with 2 units and started on Protonix 40 mg BID for 6 weeks then one daily. Was given flu and pneumonia vaccinations in the hospital and schedule for cardiology (Dr. Clayborn Bigness) follow up on 11-17-17. No  melena, hematochezia, hematuria or further hematemesis since hospitalization. Slowly regaining energy level. Will recheck CBC and CMP. Follow up with GI on 12-20-17 and go to ER if any further signs of bleeding. - COMPLETE METABOLIC PANEL WITH GFR - CBC with Differential/Platelet  2. History of DVT (deep vein thrombosis) No longer on Coumadin with recent severe GI bleed. Has an appointment with her [facility] Vascular Surgeon (11-02-17) to re-evaluate right femoral-peroneal bypass with PTFE vein cuff 03-27-13. Protime INR 1.3 today. - POCT INR  3. Stage 3 chronic kidney disease ([place]) Creatinine 1.75 at hospital discharge 10-21-17. Has an appointment to follow up with Dr. Candiss Norse (nephrologist) in the next couple weeks - COMPLETE METABOLIC PANEL WITH GFR - CBC with Differential/Platelet       [name], PA  [place]

## 2017-10-30 ENCOUNTER — Telehealth: Payer: Self-pay

## 2017-10-30 NOTE — Telephone Encounter (Signed)
[name] patient's  Daughter advised as directed below.  Thanks,  -Dory Verdun

## 2017-10-30 NOTE — Telephone Encounter (Signed)
-----   Message from [place], [place] sent at 10/29/2017  7:29 PM EST ----- Creatinine worse indicating more stress on renal function. Slowly improving hemoglobin. Proceed with GI doctor and nephrology follow up.

## 2017-11-02 ENCOUNTER — Encounter: Payer: Self-pay | Admitting: Gastroenterology

## 2017-11-02 ENCOUNTER — Other Ambulatory Visit: Payer: Self-pay

## 2017-11-02 DIAGNOSIS — I739 Peripheral vascular disease, unspecified: Secondary | ICD-10-CM | POA: Diagnosis not present

## 2017-11-02 MED ORDER — PANTOPRAZOLE SODIUM 40 MG PO TBEC: 40.0000 mg | DELAYED_RELEASE_TABLET | Freq: Two times a day (BID) | ORAL | 1 refills | Status: DC

## 2017-11-03 NOTE — Telephone Encounter (Signed)
error 

## 2017-11-04 ENCOUNTER — Inpatient Hospital Stay: Payer: Self-pay | Admitting: Family Medicine

## 2017-11-08 NOTE — Consult Note (Signed)
Reason for Consult: Elevated troponin possible non-STEMI with anemia Referring Physician: Dr. Posey Pronto hospitalist  Lori Johnson is an 83 y.o. female.  HPI: Patient 83 year old with known peripheral vascular disease status post recent peripheral vascular intervention found to have arterial thrombus placed on Coumadin patient has arterial sclerotic vascular disease GERD chronic insufficiency hypertension as well as previous history of smoking.  Patient has significant episodes of claudication.  Patient has known history of coronary bypass surgery in the past denies any angina but was found to have elevated troponin.  Patient was found to have profound anemia with borderline troponins denies any chest pain  Past Medical History:  Diagnosis Date  . Angiopathy, peripheral ([place])   . Arterial embolus and thrombosis ([place])   . ASCVD (arteriosclerotic cardiovascular disease)    with stent RCA  . Bilateral bunions   . Cervical lymphadenitis   . Chronic gout of left ankle due to renal impairment without tophus   . Chronic infection June 2014   Has chronic infection of right femoral graft with history of extensive debridement in June 2014. Was on oral Clindamycin for 3 weeks for culture positive for anerobes and staph lugdunensis. Vascular Surgeon at [facility] Remonia Richter, MD) recommended indefinite suppressive therapy with Cephalexin 500 mg qd and reassessment every 3 months. Will refill Cephalexin.)  . Chronic kidney disease   . CRF (chronic renal failure)   . GERD (gastroesophageal reflux disease)   . Hiatal hernia   . History of arterial bypass of lower extremity   . Hyperlipidemia   . Hypertension   . Hyperuricemia   . Macular degeneration   . Mass of right side of neck   . Nocturia   . OA (osteoarthritis)   . PVD (peripheral vascular disease) ([place])   . Renal disorder     Past Surgical History:  Procedure Laterality Date  . ABDOMINAL HYSTERECTOMY  1981  . arterial bypass right leg Right   .  broken left foot Left 01/2002  . CHOLECYSTECTOMY    . CORONARY ANGIOPLASTY WITH STENT PLACEMENT    . stent in leg      Family History  Problem Relation Age of Onset  . Heart disease Mother   . Heart disease Father     Social History:  reports that she has quit smoking. Her smoking use included cigarettes. she has never used smokeless tobacco. She reports that she does not drink alcohol or use drugs.  Allergies:  Allergies  Allergen Reactions  . Pletal [Cilostazol] Swelling  . Erythromycin Hives  . Codeine Nausea Only and Nausea And Vomiting  . Erythromycin Base Itching and Rash  . Penicillins Hives, Itching and Rash  . Sulfa Antibiotics Rash     Medications: See previous list from H&P  No results found for this or any previous visit (from the past 48 hour(s)).  No results found.  Review of Systems  Constitutional: Positive for malaise/fatigue.  HENT: Negative.   Eyes: Negative.   Respiratory: Negative.   Cardiovascular: Positive for claudication.  Gastrointestinal: Positive for blood in stool, heartburn and melena.  Genitourinary: Negative.   Musculoskeletal: Negative.   Skin: Negative.   Neurological: Positive for weakness.  Endo/Heme/Allergies: Negative.   Psychiatric/Behavioral: Negative.    Blood pressure (!) 143/45, pulse 74, temperature 97.7 F (36.5 C), temperature source Oral, resp. rate 14, height 5\' 4"  (1.626 m), weight 185 lb 10 oz (84.2 kg), SpO2 94 %. Physical Exam  Constitutional: She is oriented to person, place, and time. She appears  well-developed and well-nourished.  HENT:  Head: Normocephalic and atraumatic.  Eyes: Conjunctivae and EOM are normal. Pupils are equal, round, and reactive to light.  Neck: Normal range of motion. Neck supple.  Cardiovascular: Normal rate, regular rhythm and normal heart sounds.  Respiratory: Effort normal and breath sounds normal.  GI: Soft. Bowel sounds are normal.  Musculoskeletal: Normal range of motion.   Neurological: She is alert and oriented to person, place, and time. She has normal reflexes.  Skin: Skin is warm and dry.  Psychiatric: She has a normal mood and affect.    Assessment/Plan: Elevated troponin Anemia HTN PVD DVT CRI Thrombosis Hyperlipidemia GERD . PLAN Follow-up troponins do not recommend anticoagulation Hold Coumadin because of GI bleeding Follow-up with nephrology because of renal insufficiency Recommend vascular input because of recent PVD and arterial thrombosis Continue statin therapy for hyperlipidemia Transfuse 2 hemoglobin of 9 Agree with GI input for further assessment evaluation ASCVD continue current therapy Patient follow-up with cardiology as an outpatient Do not recommend any invasive strategy for elevated troponin Possibly related to demand ischemia in my estimation   Cheralyn Oliver D Lamarco Gudiel 11/08/2017, 2:57 PM

## 2017-11-09 DIAGNOSIS — N184 Chronic kidney disease, stage 4 (severe): Secondary | ICD-10-CM | POA: Diagnosis not present

## 2017-11-09 DIAGNOSIS — R609 Edema, unspecified: Secondary | ICD-10-CM | POA: Diagnosis not present

## 2017-11-09 DIAGNOSIS — I10 Essential (primary) hypertension: Secondary | ICD-10-CM | POA: Diagnosis not present

## 2017-11-09 DIAGNOSIS — E872 Acidosis: Secondary | ICD-10-CM | POA: Diagnosis not present

## 2017-11-09 DIAGNOSIS — E211 Secondary hyperparathyroidism, not elsewhere classified: Secondary | ICD-10-CM | POA: Diagnosis not present

## 2017-11-12 ENCOUNTER — Other Ambulatory Visit: Payer: Self-pay | Admitting: Family Medicine

## 2017-11-12 DIAGNOSIS — R609 Edema, unspecified: Secondary | ICD-10-CM | POA: Diagnosis not present

## 2017-11-12 DIAGNOSIS — N184 Chronic kidney disease, stage 4 (severe): Secondary | ICD-10-CM | POA: Diagnosis not present

## 2017-11-12 DIAGNOSIS — I10 Essential (primary) hypertension: Secondary | ICD-10-CM | POA: Diagnosis not present

## 2017-11-15 ENCOUNTER — Other Ambulatory Visit: Payer: Self-pay

## 2017-11-15 MED ORDER — PANTOPRAZOLE SODIUM 40 MG PO TBEC: 40.0000 mg | DELAYED_RELEASE_TABLET | Freq: Two times a day (BID) | ORAL | 1 refills | Status: DC

## 2017-11-17 DIAGNOSIS — R748 Abnormal levels of other serum enzymes: Secondary | ICD-10-CM | POA: Diagnosis not present

## 2017-11-17 DIAGNOSIS — I2510 Atherosclerotic heart disease of native coronary artery without angina pectoris: Secondary | ICD-10-CM | POA: Diagnosis not present

## 2017-11-17 DIAGNOSIS — D649 Anemia, unspecified: Secondary | ICD-10-CM | POA: Diagnosis not present

## 2017-11-17 DIAGNOSIS — Z8719 Personal history of other diseases of the digestive system: Secondary | ICD-10-CM | POA: Diagnosis not present

## 2017-11-17 DIAGNOSIS — I10 Essential (primary) hypertension: Secondary | ICD-10-CM | POA: Diagnosis not present

## 2017-11-17 DIAGNOSIS — I350 Nonrheumatic aortic (valve) stenosis: Secondary | ICD-10-CM | POA: Diagnosis not present

## 2017-11-17 DIAGNOSIS — I739 Peripheral vascular disease, unspecified: Secondary | ICD-10-CM | POA: Diagnosis not present

## 2017-11-17 DIAGNOSIS — E782 Mixed hyperlipidemia: Secondary | ICD-10-CM | POA: Diagnosis not present

## 2017-11-19 ENCOUNTER — Telehealth: Payer: Self-pay | Admitting: Gastroenterology

## 2017-11-19 ENCOUNTER — Other Ambulatory Visit: Payer: Self-pay

## 2017-11-19 ENCOUNTER — Encounter: Payer: Self-pay | Admitting: Gastroenterology

## 2017-11-19 MED ORDER — PANTOPRAZOLE SODIUM 40 MG PO TBEC: 40.0000 mg | DELAYED_RELEASE_TABLET | Freq: Two times a day (BID) | ORAL | 3 refills | Status: DC

## 2017-11-19 NOTE — Telephone Encounter (Signed)
*  STAT* If patient is at the pharmacy, call can be transferred to refill team.   1. Which medications need to be refilled? (please list name of each medication and dose if known) PANTOPRAZOLE (PROTONIX) 40 MG X 2   2. Which pharmacy/location (including street and city if local pharmacy) is medication to be sent to? [employer].... Last time dosage wasn't sent in.  3. Do they need a 30 day or 90 day supply? 90 day

## 2017-11-24 NOTE — Telephone Encounter (Signed)
error 

## 2017-11-25 ENCOUNTER — Other Ambulatory Visit: Payer: Self-pay | Admitting: Family Medicine

## 2017-11-25 DIAGNOSIS — J45991 Cough variant asthma: Secondary | ICD-10-CM

## 2017-11-25 MED ORDER — ALBUTEROL SULFATE HFA 108 (90 BASE) MCG/ACT IN AERS: 2.0000 | INHALATION_SPRAY | Freq: Four times a day (QID) | RESPIRATORY_TRACT | 0 refills | Status: DC | PRN

## 2017-12-06 DIAGNOSIS — I10 Essential (primary) hypertension: Secondary | ICD-10-CM | POA: Diagnosis not present

## 2017-12-06 DIAGNOSIS — E872 Acidosis: Secondary | ICD-10-CM | POA: Diagnosis not present

## 2017-12-06 DIAGNOSIS — E211 Secondary hyperparathyroidism, not elsewhere classified: Secondary | ICD-10-CM | POA: Diagnosis not present

## 2017-12-06 DIAGNOSIS — R600 Localized edema: Secondary | ICD-10-CM | POA: Diagnosis not present

## 2017-12-06 DIAGNOSIS — N184 Chronic kidney disease, stage 4 (severe): Secondary | ICD-10-CM | POA: Diagnosis not present

## 2017-12-09 DIAGNOSIS — I10 Essential (primary) hypertension: Secondary | ICD-10-CM | POA: Diagnosis not present

## 2017-12-09 DIAGNOSIS — R609 Edema, unspecified: Secondary | ICD-10-CM | POA: Diagnosis not present

## 2017-12-09 DIAGNOSIS — N184 Chronic kidney disease, stage 4 (severe): Secondary | ICD-10-CM | POA: Diagnosis not present

## 2017-12-10 ENCOUNTER — Telehealth: Payer: Self-pay

## 2017-12-15 NOTE — Telephone Encounter (Signed)
Scheduled appointment for 01/03/2018 at 9:15

## 2017-12-17 ENCOUNTER — Other Ambulatory Visit: Payer: Self-pay | Admitting: Family Medicine

## 2017-12-17 DIAGNOSIS — J45991 Cough variant asthma: Secondary | ICD-10-CM

## 2017-12-20 ENCOUNTER — Ambulatory Visit: Payer: Medicare PPO | Admitting: Gastroenterology

## 2017-12-20 ENCOUNTER — Other Ambulatory Visit: Payer: Self-pay

## 2017-12-20 ENCOUNTER — Encounter: Payer: Self-pay | Admitting: Gastroenterology

## 2017-12-20 ENCOUNTER — Encounter (INDEPENDENT_AMBULATORY_CARE_PROVIDER_SITE_OTHER): Payer: Self-pay

## 2017-12-20 VITALS — BP 162/60 | HR 65 | Ht 64.0 in | Wt 173.8 lb

## 2017-12-20 DIAGNOSIS — D62 Acute posthemorrhagic anemia: Secondary | ICD-10-CM | POA: Diagnosis not present

## 2017-12-20 DIAGNOSIS — K920 Hematemesis: Secondary | ICD-10-CM

## 2017-12-20 MED ORDER — PANTOPRAZOLE SODIUM 20 MG PO TBEC: DELAYED_RELEASE_TABLET | ORAL | 3 refills | Status: DC

## 2017-12-20 NOTE — Patient Instructions (Signed)
F/U in 3 months. Bed wedge.

## 2017-12-20 NOTE — Progress Notes (Signed)
Lori Antigua, MD [address]  [place]  [place], [place] [address]  Main: [phone]  Fax: [phone]   Primary Care Physician: Margo Common, [place]  Primary Gastroenterologist:  Dr. Vonda Johnson  Chief Complaint  Patient presents with  . Follow-up  Follow-up for anemia and hematemesis  HPI: Lori Johnson is a 84 y.o. female presents for follow-up of anemia and hematemesis.  Denies any melena, hematochezia, hematemesis.  Tolerating oral diet without difficulty.  No nausea vomiting.  No dysphagia.  Was seen by vascular surgery and per patient and her daughter, they state that vascular surgery elected not to continue Coumadin as they think due to her age she is at high risk for bleeding and falls.  And they state that her cardiologist agreed with this recommendation as well.  Notes in care everywhere from her vascular surgery appointment are consistent with this, and recommend staying off Coumadin and beginning aspirin 81 mg daily.  Patient started taking Protonix 40 mg once daily yesterday, prior to that she was taking it twice a day.  She has taken a total of 8 weeks twice a day course.  Last hemoglobin check was on January 7 by her nephrologist, labs available under media.  And show a hemoglobin of 11.5.  Previous history as per my previous notes: "admitted to the hospital on November 17 due to hematemesis.  Patient was on Coumadin, with a supratherapeutic INR at 3.2, due to peripheral vascular disease and stenting.  During the admission, her hematemesis resolved with IV PPI and correction of INR, and due to an elevated troponin to 4.5 acutely during the admission, endoscopy was high risk and was not performed.  Patient's Coumadin has been held since the admission.  Her hemoglobin at discharge was 9.1 and was 7.6 when she was admitted.  She was seen by cardiology who recommended conservative management for NSTEMI. "  Current Outpatient Medications  Medication Sig  Dispense Refill  . Acetaminophen (TYLENOL ARTHRITIS PAIN PO) Take by mouth.    [name] albuterol (PROVENTIL HFA;VENTOLIN HFA) 108 (90 Base) MCG/ACT inhaler TAKE 2 PUFFS BY MOUTH EVERY 6 HOURS AS NEEDED FOR WHEEZE OR SHORTNESS OF BREATH 18 g 0  . amLODipine (NORVASC) 10 MG tablet     . calcipotriene (DOVONOX) 0.005 % cream Apply twice daily to forehead, temples, cheeks and nose for 4-5 days  0  . diclofenac sodium (VOLTAREN) 1 % GEL Apply 2 g topically 4 (four) times daily. 100 g 0  . furosemide (LASIX) 40 MG tablet Take 40 mg every other day by mouth.     . hydrocortisone valerate cream (WESTCORT) 0.2 %     . losartan-hydrochlorothiazide (HYZAAR) 50-12.5 MG tablet TAKE 1 TABLET EVERY DAY 90 tablet 3  . mometasone (ELOCON) 0.1 % ointment APPLY TWICE DAILY TO AFFECTED AREAS ON LEGS  1  . pravastatin (PRAVACHOL) 20 MG tablet TAKE 1 TABLET AT BEDTIME 90 tablet 3  . pantoprazole (PROTONIX) 20 MG tablet Take 2 caps. Daily in am. 180 tablet 3   No current facility-administered medications for this visit.     Allergies as of 12/20/2017 - Review Complete 12/20/2017  Allergen Reaction Noted  . Pletal [cilostazol] Swelling 03/31/2015  . Erythromycin Hives 03/31/2015  . Codeine Nausea Only and Nausea And Vomiting 03/31/2015  . Erythromycin base Itching and Rash 05/06/2015  . Penicillins Hives, Itching, and Rash 03/31/2015  . Sulfa antibiotics Rash 03/31/2015    ROS:  General: Negative for anorexia, weight loss, fever, chills,  fatigue, weakness. ENT: Negative for hoarseness, difficulty swallowing , nasal congestion. CV: Negative for chest pain, angina, palpitations, dyspnea on exertion, peripheral edema.  Respiratory: Negative for dyspnea at rest, dyspnea on exertion, cough, sputum, wheezing.  GI: See history of present illness. GU:  Negative for dysuria, hematuria, urinary incontinence, urinary frequency, nocturnal urination.  Endo: Negative for unusual weight change.    Physical Examination:    BP (!) 162/60   Pulse 65   Ht 5\' 4"  (1.626 m)   Wt 173 lb 12.8 oz (78.8 kg)   BMI 29.83 kg/m   General: Well-nourished, well-developed in no acute distress.  Eyes: No icterus. Conjunctivae pink. Mouth: Oropharyngeal mucosa moist and pink , no lesions erythema or exudate. Abdomen: Bowel sounds are normal, nontender, nondistended, no hepatosplenomegaly or masses, no abdominal bruits or hernia , no rebound or guarding.   Extremities: No lower extremity edema. No clubbing or deformities. Neuro: Alert and oriented x 3.  Grossly intact. Skin: Warm and dry, no jaundice.   Psych: Alert and cooperative, normal mood and affect.   Labs: CMP     Component Value Date/Time   NA 141 10/29/2017 1104   NA 142 07/12/2017   NA 139 12/06/2014 1828   K 4.0 10/29/2017 1104   K 4.4 12/06/2014 1828   CL 100 10/29/2017 1104   CL 107 12/06/2014 1828   CO2 26 10/29/2017 1104   CO2 25 12/06/2014 1828   GLUCOSE 109 (H) 10/29/2017 1104   GLUCOSE 106 (H) 12/06/2014 1828   BUN 23 10/29/2017 1104   BUN 37 (A) 07/12/2017   BUN 33 (H) 12/06/2014 1828   CREATININE 1.98 (H) 10/29/2017 1104   CALCIUM 9.1 10/29/2017 1104   CALCIUM 8.9 12/06/2014 1828   PROT 6.2 10/29/2017 1104   PROT 7.0 05/15/2016 1040   PROT 7.9 12/06/2014 1828   ALBUMIN 2.8 (L) 10/16/2017 0604   ALBUMIN 4.2 05/15/2016 1040   ALBUMIN 3.9 12/06/2014 1828   AST 19 10/29/2017 1104   AST 29 12/06/2014 1828   ALT 10 10/29/2017 1104   ALT 24 12/06/2014 1828   ALKPHOS 46 10/16/2017 0604   ALKPHOS 97 12/06/2014 1828   BILITOT 0.7 10/29/2017 1104   BILITOT 0.5 05/15/2016 1040   BILITOT 0.5 12/06/2014 1828   GFRNONAA 22 (L) 10/29/2017 1104   GFRAA 26 (L) 10/29/2017 1104   Lab Results  Component Value Date   WBC 6.5 10/29/2017   HGB 9.4 (L) 10/29/2017   HCT 28.5 (L) 10/29/2017   MCV 90.2 10/29/2017   PLT 314 10/29/2017    Imaging Studies: No results found.  Assessment and Plan:   Lori Johnson is a 84 y.o. y/o female here for  follow-up of hematemesis and anemia in November 2018 in the setting of Coumadin use at the time  Vascular surgery have decided not to continue on her Coumadin and have started her on aspirin 81 mg daily instead Since she is not requiring any anticoagulation, would recommend conservative management at this time, and continuing Protonix (as risks of procedure would outweigh benefits at this time due to her recent NSTEMI, and no active GI bleeding since discharge). Patient has no signs of active GI bleeding at this time, and has not had any signs of active GI bleeding since discharge from the hospital.  No indication for urgent endoscopy. If patient is determined to need anticoagulation in the future by cardiology or vascular surgery, endoscopy can be considered after cardiac clearance and anesthesia evaluation is done. Her  hematemesis prior to the hospital admission may have been due to esophagitis or PUD or malignancy. Will order CT Abdomen to rule out any large lesions in her upper GI tract. Will order without IV contrast due to her CKD.   Patient will continue Protonix 40 mg once daily for another 4 weeks which would then be a total of 12 weeks of Protonix (8 weeks of twice daily, 4 weeks of once daily).  After this, she will take 20 mg once daily as she is requiring aspirin indefinitely, and this would be to prevent peptic ulcer disease. (Risks of PPI use were discussed with patient including bone loss, C. Diff diarrhea, pneumonia, infections, CKD, electrolyte abnormalities. Pt. Verbalizes understanding and chooses to continue the medication).  We will try to maintain patient on the lowest dose possible of the PPI.  At this time benefits of the medication would outweigh the risk given her recent history of hematemesis and her requirement of aspirin at this time.  Patient reports 1-2 episodes of breakthrough heartburn at night.  I have asked her to inform us if these worsen with the change in medication  to once daily.  In the meantime I have asked them to buy a bed wedge and educated her on antireflux lifestyle measures.  Patient and daughter verbalized understanding.  We have asked to come to the ER and inform us immediately if she has any signs of active GI bleeding.  She states her cardiologist has also asked her to go to the ER if she develops any signs of chest pain.  She does not have any of these at this time.  I also discussed with the patient and daughter that since patient has never had a colonoscopy before, the possibility of a colonic malignancy cannot be ruled out until direct visualization is performed.  However, she is 84 years old and colonoscopy for colorectal cancer screening is typically not recommended after 82 years of age.  They verbalized understanding of the risks of missing a colon malignancy if colonoscopy is not performed, however, given her age and comorbidities, they elect not to pursue colorectal cancer screening, which is reasonable.  They also state, if the colon malignancy is found, they would not want surgery or chemotherapy, and thus a colonoscopy would be futile if a colonic malignancy is found.  I did discuss, the colonoscopy can show polyps that can be removed to prevent cancer, however, would not like to undergo colonoscopy at this time which is reasonable.  Dr Lori Johnson

## 2017-12-23 DIAGNOSIS — L570 Actinic keratosis: Secondary | ICD-10-CM | POA: Diagnosis not present

## 2017-12-23 DIAGNOSIS — C44729 Squamous cell carcinoma of skin of left lower limb, including hip: Secondary | ICD-10-CM | POA: Diagnosis not present

## 2017-12-25 ENCOUNTER — Other Ambulatory Visit: Payer: Self-pay | Admitting: Family Medicine

## 2018-01-01 ENCOUNTER — Other Ambulatory Visit: Payer: Self-pay | Admitting: Family Medicine

## 2018-01-03 ENCOUNTER — Encounter: Payer: Medicare PPO | Admitting: Family Medicine

## 2018-01-03 ENCOUNTER — Ambulatory Visit: Payer: Medicare PPO

## 2018-01-03 NOTE — Telephone Encounter (Signed)
Pharmacy requesting refills. Thanks!  

## 2018-01-10 ENCOUNTER — Other Ambulatory Visit: Payer: Self-pay | Admitting: Family Medicine

## 2018-01-10 DIAGNOSIS — Z95828 Presence of other vascular implants and grafts: Secondary | ICD-10-CM

## 2018-01-10 DIAGNOSIS — B999 Unspecified infectious disease: Secondary | ICD-10-CM

## 2018-01-10 MED ORDER — CEPHALEXIN 500 MG PO CAPS: 500.0000 mg | ORAL_CAPSULE | Freq: Every day | ORAL | 1 refills | Status: DC

## 2018-01-10 NOTE — Telephone Encounter (Signed)
Left message advising below.  

## 2018-01-10 NOTE — Telephone Encounter (Signed)
Pt requesting refill of Cephalexin 500 MG cap.  States pt has been on it for 5 years and has been getting it form our office.  Requesting this RX be sent to [facility] mail order.  Pt is out of medication.

## 2018-01-10 NOTE — Telephone Encounter (Signed)
Sent order to [facility]. Keep follow up appointment as scheduled.

## 2018-01-14 ENCOUNTER — Telehealth: Payer: Self-pay

## 2018-01-14 ENCOUNTER — Encounter: Payer: Self-pay | Admitting: Family Medicine

## 2018-01-14 ENCOUNTER — Ambulatory Visit (INDEPENDENT_AMBULATORY_CARE_PROVIDER_SITE_OTHER): Payer: Medicare PPO

## 2018-01-14 ENCOUNTER — Ambulatory Visit: Payer: Medicare PPO | Admitting: Family Medicine

## 2018-01-14 ENCOUNTER — Other Ambulatory Visit: Payer: Self-pay | Admitting: Family Medicine

## 2018-01-14 VITALS — BP 138/54 | HR 60 | Temp 98.1°F | Ht 64.0 in | Wt 178.6 lb

## 2018-01-14 DIAGNOSIS — N184 Chronic kidney disease, stage 4 (severe): Secondary | ICD-10-CM | POA: Diagnosis not present

## 2018-01-14 DIAGNOSIS — Z0000 Encounter for general adult medical examination without abnormal findings: Secondary | ICD-10-CM | POA: Diagnosis not present

## 2018-01-14 DIAGNOSIS — I10 Essential (primary) hypertension: Secondary | ICD-10-CM

## 2018-01-14 DIAGNOSIS — G478 Other sleep disorders: Secondary | ICD-10-CM

## 2018-01-14 DIAGNOSIS — Z95828 Presence of other vascular implants and grafts: Secondary | ICD-10-CM | POA: Diagnosis not present

## 2018-01-14 DIAGNOSIS — B999 Unspecified infectious disease: Secondary | ICD-10-CM

## 2018-01-14 DIAGNOSIS — J45991 Cough variant asthma: Secondary | ICD-10-CM

## 2018-01-14 NOTE — Telephone Encounter (Addendum)
-----   Message from Virgel Manifold, MD sent at 12/20/2017 11:03 AM EST ----- Jackelyn Poling can you please let the patient know we would like to do a CT Abd/Pelvis to rule out any lesions in her GI tract. Can you please order this, it will be without IV contrast. Indication: Anemia, Hematemesis, rule out malignancy, rule out peptic ulcer disease lmtco.

## 2018-01-14 NOTE — Progress Notes (Signed)
Subjective:   Lori Johnson is a 84 y.o. female who presents for Medicare Annual (Subsequent) preventive examination.  Review of Systems:  N/A  Cardiac Risk Factors include: advanced age (>55men, >48 women);dyslipidemia;hypertension;obesity (BMI >30kg/m2)     Objective:     Vitals: BP (!) 138/54 (BP Location: Left Arm)   Pulse 60   Temp 98.1 F (36.7 C) (Oral)   Ht 5\' 4"  (1.626 m)   Wt 178 lb 9.6 oz (81 kg)   BMI 30.66 kg/m   Body mass index is 30.66 kg/m.  Advanced Directives 01/14/2018 10/16/2017 01/08/2017 03/31/2015  Does Patient Have a Medical Advance Directive? No Yes No No  Type of Advance Directive - Living will;Healthcare Power of Attorney - -  Does patient want to make changes to medical advance directive? No - Patient declined No - Patient declined - -  Copy of [place] in Chart? - No - copy requested - -  Would patient like information on creating a medical advance directive? - - Yes (ED - Information included in AVS) No - patient declined information    Tobacco Social History   Tobacco Use  Smoking Status Former Smoker  . Types: Cigarettes  Smokeless Tobacco Never Used  Tobacco Comment   quit >30-40 years ago     Counseling given: Not Answered Comment: quit >30-40 years ago   Clinical Intake:  Pre-visit preparation completed: Yes  Pain : No/denies pain Pain Score: 0-No pain     Nutritional Status: BMI > 30  Obese Nutritional Risks: None Diabetes: No  How often do you need to have someone help you when you read instructions, pamphlets, or other written materials from your doctor or pharmacy?: 5 - Always(Due to Macular Degeneration.)  Interpreter Needed?: No  Information entered by :: [facility], LPN  Past Medical History:  Diagnosis Date  . Angiopathy, peripheral ([place])   . Arterial embolus and thrombosis ([place])   . ASCVD (arteriosclerotic cardiovascular disease)    with stent RCA  . Bilateral bunions   . Cancer ([place])    SCC removed off left leg  . Cervical lymphadenitis   . Chronic gout of left ankle due to renal impairment without tophus   . Chronic infection June 2014   Has chronic infection of right femoral graft with history of extensive debridement in June 2014. Was on oral Clindamycin for 3 weeks for culture positive for anerobes and staph lugdunensis. Vascular Surgeon at [facility] Remonia Richter, MD) recommended indefinite suppressive therapy with Cephalexin 500 mg qd and reassessment every 3 months. Will refill Cephalexin.)  . Chronic kidney disease   . CRF (chronic renal failure)   . GERD (gastroesophageal reflux disease)   . Hiatal hernia   . History of arterial bypass of lower extremity   . Hyperlipidemia   . Hypertension   . Hyperuricemia   . Macular degeneration   . Mass of right side of neck   . Nocturia   . OA (osteoarthritis)   . PVD (peripheral vascular disease) ([place])   . Renal disorder    Past Surgical History:  Procedure Laterality Date  . ABDOMINAL HYSTERECTOMY  1981  . arterial bypass right leg Right   . broken left foot Left 01/2002  . CHOLECYSTECTOMY    . CORONARY ANGIOPLASTY WITH STENT PLACEMENT    . stent in leg     Family History  Problem Relation Age of Onset  . Heart disease Mother   . Heart disease Father  Social History   Socioeconomic History  . Marital status: Widowed    Spouse name: None  . Number of children: 3  . Years of education: None  . Highest education level: 9th grade  Social Needs  . Financial resource strain: Not hard at all  . Food insecurity - worry: Never true  . Food insecurity - inability: Never true  . Transportation needs - medical: No  . Transportation needs - non-medical: No  Occupational History  . Occupation: retired  Tobacco Use  . Smoking status: Former Smoker    Types: Cigarettes  . Smokeless tobacco: Never Used  . Tobacco comment: quit >30-40 years ago  Substance and Sexual Activity  . Alcohol use: No  . Drug use: No  .  Sexual activity: None  Other Topics Concern  . None  Social History Narrative   Lives with daughter. Independent at baseline. Uses walker only for outside    Outpatient Encounter Medications as of 01/14/2018  Medication Sig  . Acetaminophen (TYLENOL ARTHRITIS PAIN PO) Take by mouth as needed.   [name] albuterol (PROVENTIL HFA;VENTOLIN HFA) 108 (90 Base) MCG/ACT inhaler TAKE 2 PUFFS BY MOUTH EVERY 6 HOURS AS NEEDED FOR WHEEZE OR SHORTNESS OF BREATH  . amLODipine (NORVASC) 10 MG tablet TAKE 1 TABLET EVERY DAY  . aspirin EC 81 MG tablet Take 81 mg by mouth daily.  . cephALEXin (KEFLEX) 500 MG capsule Take 1 capsule (500 mg total) by mouth daily.  . furosemide (LASIX) 40 MG tablet Take 40 mg every other day by mouth.   . hydrocortisone valerate cream (WESTCORT) 0.2 % Apply 1 application topically as needed.   [name] losartan-hydrochlorothiazide (HYZAAR) 50-12.5 MG tablet TAKE 1 TABLET EVERY DAY  . Multiple Vitamins-Minerals (ICAPS AREDS 2 PO) Take 1 tablet by mouth daily.  . pantoprazole (PROTONIX) 20 MG tablet Take 2 caps. Daily in am.  . pravastatin (PRAVACHOL) 20 MG tablet TAKE 1 TABLET AT BEDTIME  . [DISCONTINUED] mometasone (ELOCON) 0.1 % ointment APPLY TWICE DAILY TO AFFECTED AREAS ON LEGS  . [DISCONTINUED] calcipotriene (DOVONOX) 0.005 % cream Apply twice daily to forehead, temples, cheeks and nose for 4-5 days  . [DISCONTINUED] diclofenac sodium (VOLTAREN) 1 % GEL Apply 2 g topically 4 (four) times daily.   No facility-administered encounter medications on file as of 01/14/2018.     Activities of Daily Living In your present state of health, do you have any difficulty performing the following activities: 01/14/2018 10/16/2017  Hearing? Y Y  Comment Pt declined use of hearing aids or see an audiologist. -  Vision? Y Y  Comment Due to macular degeneration- loss of vision.  -  Difficulty concentrating or making decisions? N N  Walking or climbing stairs? N Y  Dressing or bathing? N N  Doing  errands, shopping? Y Y  Comment Does not drive. -  Preparing Food and eating ? N -  Using the Toilet? N -  In the past six months, have you accidently leaked urine? Y -  Comment Occasionally, wears protection. -  Do you have problems with loss of bowel control? N -  Managing your Medications? N -  Managing your Finances? Y -  Comment Due to vision loss.  -  Housekeeping or managing your Housekeeping? N -  Some recent data might be hidden    Patient Care Team: Chrismon, Vickki Muff, PA as PCP - General (Physician Assistant) Lorelee Cover., MD as Consulting Physician (Ophthalmology) Murlean Iba, MD as Consulting Physician (Internal Medicine)  Dasher, Rayvon Char, MD as Consulting Physician (Dermatology) Lavera Guise, MD as Consulting Physician (Internal Medicine) Crowner, Joesph Fillers, MD as Referring Physician (Vascular Surgery) Virgel Manifold, MD as Consulting Physician (Gastroenterology)    Assessment:   This is a routine wellness examination for Lori Johnson.  Exercise Activities and Dietary recommendations Current Exercise Habits: Home exercise routine, Type of exercise: walking, Time (Minutes): 10, Frequency (Times/Week): 2, Weekly Exercise (Minutes/Week): 20, Intensity: Mild  Goals    . DIET - EAT MORE FRUITS AND VEGETABLES     Recommend increasing amount of fruits in daily diet to 1-2 servings daily.        Fall Risk Fall Risk  01/14/2018 01/08/2017 02/28/2016  Falls in the past year? No No No   Is the patient's home free of loose throw rugs in walkways, pet beds, electrical cords, etc?   yes      Grab bars in the bathroom? yes      Handrails on the stairs?   yes      Adequate lighting?   yes  Timed Get Up and Go performed: N/A  Depression Screen PHQ 2/9 Scores 01/14/2018 01/14/2018 01/08/2017 02/28/2016  PHQ - 2 Score 0 0 0 0  PHQ- 9 Score 5 - - -     Cognitive Function     6CIT Screen 01/14/2018 01/08/2017  What Year? 0 points 0 points  What month? 0 points 0 points    What time? 0 points 0 points  Count back from 20 0 points 0 points  Months in reverse 4 points 2 points  Repeat phrase 6 points 8 points  Total Score 10 10    Immunization History  Administered Date(s) Administered  . Influenza Nasal 01/22/2013  . Influenza Split 09/13/2012  . Influenza, High Dose Seasonal PF 10/01/2014, 08/14/2016, 09/03/2017  . Influenza,inj,Quad PF,6+ Mos 12/06/2015  . Pneumococcal Conjugate-13 11/13/2014  . Pneumococcal Polysaccharide-23 01/08/2017, 10/17/2017    Qualifies for Shingles Vaccine? Due for Shingles vaccine. Declined my offer to administer today. Education has been provided regarding the importance of this vaccine. Pt has been advised to call her insurance company to determine her out of pocket expense. Advised she may also receive this vaccine at her local pharmacy or Health Dept. Verbalized acceptance and understanding.  Screening Tests Health Maintenance  Topic Date Due  . TETANUS/TDAP  11/30/2026 (Originally 03/16/1949)  . INFLUENZA VACCINE  Completed  . DEXA SCAN  Completed  . PNA vac Low Risk Adult  Completed    Cancer Screenings: Lung: Low Dose CT Chest recommended if Age 24-80 years, 30 pack-year currently smoking OR have quit w/in 15years. Patient does not qualify. Breast:  Up to date on Mammogram? N/A Up to date of Bone Density/Dexa? Yes Colorectal: N/A  Additional Screenings:  Hepatitis B/HIV/Syphillis: Pt declines today.  Hepatitis C Screening: Pt declines today.      Plan:  I have personally reviewed and addressed the Medicare Annual Wellness questionnaire and have noted the following in the patient's chart:  A. Medical and social history B. Use of alcohol, tobacco or illicit drugs  C. Current medications and supplements D. Functional ability and status E.  Nutritional status F.  Physical activity G. Advance directives H. List of other physicians I.  Hospitalizations, surgeries, and ER visits in previous 12 months J.   [place] such as hearing and vision if needed, cognitive and depression L. Referrals and appointments - none  In addition, I have reviewed and discussed with patient  certain preventive protocols, quality metrics, and best practice recommendations. A written personalized care plan for preventive services as well as general preventive health recommendations were provided to patient.  See attached scanned questionnaire for additional information.   Signed,  Fabio Neighbors, LPN Nurse Health Advisor   Nurse Recommendations: Pt declined the tetanus vaccine today.  Reviewed the Nurse Health Advisor's documentation and recommendations. Available for consultation and agree with plan.

## 2018-01-14 NOTE — Patient Instructions (Signed)
Lori Johnson , Thank you for taking time to come for your Medicare Wellness Visit. I appreciate your ongoing commitment to your health goals. Please review the following plan we discussed and let me know if I can assist you in the future.   Screening recommendations/referrals: Colonoscopy: N/A Mammogram: N/A Bone Density: Up to date Recommended yearly ophthalmology/optometry visit for glaucoma screening and checkup Recommended yearly dental visit for hygiene and checkup  Vaccinations: Influenza vaccine: Up to date Pneumococcal vaccine: Up to date Tdap vaccine: Pt declines today.  Shingles vaccine: Pt declines today.     Advanced directives: Advance directive discussed with you today. Even though you declined this today please call our office should you change your mind and we can give you the proper paperwork for you to fill out.  Conditions/risks identified: Obesity- Recommend increasing amount of fruits in daily diet to 1-2 servings daily.   Next appointment: 10:00 AM   Preventive Care 65 Years and Older, Female Preventive care refers to lifestyle choices and visits with your health care provider that can promote health and wellness. What does preventive care include?  A yearly physical exam. This is also called an annual well check.  Dental exams once or twice a year.  Routine eye exams. Ask your health care provider how often you should have your eyes checked.  Personal lifestyle choices, including:  Daily care of your teeth and gums.  Regular physical activity.  Eating a healthy diet.  Avoiding tobacco and drug use.  Limiting alcohol use.  Practicing safe sex.  Taking low-dose aspirin every day.  Taking vitamin and mineral supplements as recommended by your health care provider. What happens during an annual well check? The services and screenings done by your health care provider during your annual well check will depend on your age, overall health, lifestyle risk  factors, and family history of disease. Counseling  Your health care provider may ask you questions about your:  Alcohol use.  Tobacco use.  Drug use.  Emotional well-being.  Home and relationship well-being.  Sexual activity.  Eating habits.  History of falls.  Memory and ability to understand (cognition).  Work and work Statistician.  Reproductive health. Screening  You may have the following tests or measurements:  Height, weight, and BMI.  Blood pressure.  Lipid and cholesterol levels. These may be checked every 5 years, or more frequently if you are over 82 years old.  Skin check.  Lung cancer screening. You may have this screening every year starting at age 82 if you have a 30-pack-year history of smoking and currently smoke or have quit within the past 15 years.  Fecal occult blood test (FOBT) of the stool. You may have this test every year starting at age 82.  Flexible sigmoidoscopy or colonoscopy. You may have a sigmoidoscopy every 5 years or a colonoscopy every 10 years starting at age 82.  Hepatitis C blood test.  Hepatitis B blood test.  Sexually transmitted disease (STD) testing.  Diabetes screening. This is done by checking your blood sugar (glucose) after you have not eaten for a while (fasting). You may have this done every 1-3 years.  Bone density scan. This is done to screen for osteoporosis. You may have this done starting at age 82.  Mammogram. This may be done every 1-2 years. Talk to your health care provider about how often you should have regular mammograms. Talk with your health care provider about your test results, treatment options, and if necessary, the  need for more tests. Vaccines  Your health care provider may recommend certain vaccines, such as:  Influenza vaccine. This is recommended every year.  Tetanus, diphtheria, and acellular pertussis (Tdap, Td) vaccine. You may need a Td booster every 10 years.  Zoster vaccine. You  may need this after age 70.  Pneumococcal 13-valent conjugate (PCV13) vaccine. One dose is recommended after age 82.  Pneumococcal polysaccharide (PPSV23) vaccine. One dose is recommended after age 82. Talk to your health care provider about which screenings and vaccines you need and how often you need them. This information is not intended to replace advice given to you by your health care provider. Make sure you discuss any questions you have with your health care provider. Document Released: 12/13/2015 Document Revised: 08/05/2016 Document Reviewed: 09/17/2015 Elsevier Interactive Patient Education  2017 [place] Prevention in the Home Falls can cause injuries. They can happen to people of all ages. There are many things you can do to make your home safe and to help prevent falls. What can I do on the outside of my home?  Regularly fix the edges of walkways and driveways and fix any cracks.  Remove anything that might make you trip as you walk through a door, such as a raised step or threshold.  Trim any bushes or trees on the path to your home.  Use bright outdoor lighting.  Clear any walking paths of anything that might make someone trip, such as rocks or tools.  Regularly check to see if handrails are loose or broken. Make sure that both sides of any steps have handrails.  Any raised decks and porches should have guardrails on the edges.  Have any leaves, snow, or ice cleared regularly.  Use sand or salt on walking paths during winter.  Clean up any spills in your garage right away. This includes oil or grease spills. What can I do in the bathroom?  Use night lights.  Install grab bars by the toilet and in the tub and shower. Do not use towel bars as grab bars.  Use non-skid mats or decals in the tub or shower.  If you need to sit down in the shower, use a plastic, non-slip stool.  Keep the floor dry. Clean up any water that spills on the floor as soon as  it happens.  Remove soap buildup in the tub or shower regularly.  Attach bath mats securely with double-sided non-slip rug tape.  Do not have throw rugs and other things on the floor that can make you trip. What can I do in the bedroom?  Use night lights.  Make sure that you have a light by your bed that is easy to reach.  Do not use any sheets or blankets that are too big for your bed. They should not hang down onto the floor.  Have a firm chair that has side arms. You can use this for support while you get dressed.  Do not have throw rugs and other things on the floor that can make you trip. What can I do in the kitchen?  Clean up any spills right away.  Avoid walking on wet floors.  Keep items that you use a lot in easy-to-reach places.  If you need to reach something above you, use a strong step stool that has a grab bar.  Keep electrical cords out of the way.  Do not use floor polish or wax that makes floors slippery. If you must use wax,  use non-skid floor wax.  Do not have throw rugs and other things on the floor that can make you trip. What can I do with my stairs?  Do not leave any items on the stairs.  Make sure that there are handrails on both sides of the stairs and use them. Fix handrails that are broken or loose. Make sure that handrails are as long as the stairways.  Check any carpeting to make sure that it is firmly attached to the stairs. Fix any carpet that is loose or worn.  Avoid having throw rugs at the top or bottom of the stairs. If you do have throw rugs, attach them to the floor with carpet tape.  Make sure that you have a light switch at the top of the stairs and the bottom of the stairs. If you do not have them, ask someone to add them for you. What else can I do to help prevent falls?  Wear shoes that:  Do not have high heels.  Have rubber bottoms.  Are comfortable and fit you well.  Are closed at the toe. Do not wear sandals.  If  you use a stepladder:  Make sure that it is fully opened. Do not climb a closed stepladder.  Make sure that both sides of the stepladder are locked into place.  Ask someone to hold it for you, if possible.  Clearly mark and make sure that you can see:  Any grab bars or handrails.  First and last steps.  Where the edge of each step is.  Use tools that help you move around (mobility aids) if they are needed. These include:  Canes.  Walkers.  Scooters.  Crutches.  Turn on the lights when you go into a dark area. Replace any light bulbs as soon as they burn out.  Set up your furniture so you have a clear path. Avoid moving your furniture around.  If any of your floors are uneven, fix them.  If there are any pets around you, be aware of where they are.  Review your medicines with your doctor. Some medicines can make you feel dizzy. This can increase your chance of falling. Ask your doctor what other things that you can do to help prevent falls. This information is not intended to replace advice given to you by your health care provider. Make sure you discuss any questions you have with your health care provider. Document Released: 09/12/2009 Document Revised: 04/23/2016 Document Reviewed: 12/21/2014 Elsevier Interactive Patient Education  2017 [employer].

## 2018-01-14 NOTE — Progress Notes (Signed)
Patient: Lori Johnson, Female    DOB: 05-Oct-1930, 84 y.o.   MRN: [id number] Visit Date: 01/14/2018  Today's Provider: [name], PA   Chief Complaint  Patient presents with  . Annual Exam   Subjective:     Complete Physical [name] is a 84 y.o. female. She feels well. She reports exercising lightly. She reports she is sleeping poorly.  ----------------------------------------------------------- Patient had a AWE this morning also.   Review of Systems  Constitutional: Negative.   HENT: Negative.   Eyes: Negative.   Respiratory: Negative.   Cardiovascular: Negative.   Gastrointestinal: Negative.   Endocrine: Negative.   Genitourinary: Negative.   Musculoskeletal: Negative.   Skin: Negative.   Allergic/Immunologic: Negative.   Neurological: Negative.   Hematological: Negative.   Psychiatric/Behavioral: Positive for sleep disturbance.    Social History   Socioeconomic History  . Marital status: Widowed    Spouse name: Not on file  . Number of children: 3  . Years of education: Not on file  . Highest education level: 9th grade  Social Needs  . Financial resource strain: Not hard at all  . Food insecurity - worry: Never true  . Food insecurity - inability: Never true  . Transportation needs - medical: No  . Transportation needs - non-medical: No  Occupational History  . Occupation: retired  Tobacco Use  . Smoking status: Former Smoker    Types: Cigarettes  . Smokeless tobacco: Never Used  . Tobacco comment: quit >30-40 years ago  Substance and Sexual Activity  . Alcohol use: No  . Drug use: No  . Sexual activity: Not on file  Other Topics Concern  . Not on file  Social History Narrative   Lives with daughter. Independent at baseline. Uses walker only for outside   Past Medical History:  Diagnosis Date  . Angiopathy, peripheral ([place])   . Arterial embolus and thrombosis ([place])   . ASCVD (arteriosclerotic cardiovascular disease)    with stent  RCA  . Bilateral bunions   . Cancer ([place])    SCC removed off left leg  . Cervical lymphadenitis   . Chronic gout of left ankle due to renal impairment without tophus   . Chronic infection June 2014   Has chronic infection of right femoral graft with history of extensive debridement in June 2014. Was on oral Clindamycin for 3 weeks for culture positive for anerobes and staph lugdunensis. Vascular Surgeon at [facility] Remonia Richter, MD) recommended indefinite suppressive therapy with Cephalexin 500 mg qd and reassessment every 3 months. Will refill Cephalexin.)  . Chronic kidney disease   . CRF (chronic renal failure)   . GERD (gastroesophageal reflux disease)   . Hiatal hernia   . History of arterial bypass of lower extremity   . Hyperlipidemia   . Hypertension   . Hyperuricemia   . Macular degeneration   . Mass of right side of neck   . Nocturia   . OA (osteoarthritis)   . PVD (peripheral vascular disease) ([place])   . Renal disorder     Patient Active Problem List   Diagnosis Date Noted  . GI bleed 10/16/2017  . Closed fracture of tibial plateau 06/24/2015  . Current tear knee, medial meniscus 06/24/2015  . Arthritis of knee, degenerative 06/24/2015  . Angiopathy, peripheral ([place]) 06/06/2015  . Arterial vascular disease 06/06/2015  . Bunion 06/06/2015  . Cervical adenitis 06/06/2015  . Gout due to renal impairment of multiple sites 06/06/2015  . Chronic  infection 06/06/2015  . Contusion of forearm 06/06/2015  . Arm bruise 06/06/2015  . Asthma, cough variant 06/06/2015  . Gastroesophageal reflux disease with hiatal hernia 06/06/2015  . History of DVT of lower extremity 06/06/2015  . Bergmann's syndrome 06/06/2015  . H/O arterial bypass of lower limb 06/06/2015  . HLD (hyperlipidemia) 06/06/2015  . Benign hypertension 06/06/2015  . Elevated blood uric acid level 06/06/2015  . Disorder of kidney 06/06/2015  . Lump in neck 06/06/2015  . Excessive urination at night 06/06/2015  .  Arthritis, degenerative 06/06/2015  . Poor balance 06/06/2015  . Peripheral blood vessel disorder ([place]) 06/06/2015  . Acid reflux 03/08/2013  . Atherosclerosis of native artery of extremity ([place]) 01/22/2013  . Chronic kidney disease 01/22/2013  . CAD in native artery 01/22/2013  . Essential (primary) hypertension 01/22/2013  . Atherosclerosis of native arteries of extremity with rest pain ([place]) 01/22/2013  . Arthralgia of hip or thigh 03/29/2006   Past Surgical History:  Procedure Laterality Date  . ABDOMINAL HYSTERECTOMY  1981  . arterial bypass right leg Right   . broken left foot Left 01/2002  . CHOLECYSTECTOMY    . CORONARY ANGIOPLASTY WITH STENT PLACEMENT    . stent in leg     Her family history includes Heart disease in her father and mother.     Current Outpatient Medications:  .  Acetaminophen (TYLENOL ARTHRITIS PAIN PO), Take by mouth as needed. , Disp: , Rfl:  .  albuterol (PROVENTIL HFA;VENTOLIN HFA) 108 (90 Base) MCG/ACT inhaler, TAKE 2 PUFFS BY MOUTH EVERY 6 HOURS AS NEEDED FOR WHEEZE OR SHORTNESS OF BREATH, Disp: 18 g, Rfl: 0 .  amLODipine (NORVASC) 10 MG tablet, TAKE 1 TABLET EVERY DAY, Disp: 90 tablet, Rfl: 3 .  aspirin EC 81 MG tablet, Take 81 mg by mouth daily., Disp: , Rfl:  .  cephALEXin (KEFLEX) 500 MG capsule, Take 1 capsule (500 mg total) by mouth daily., Disp: 90 capsule, Rfl: 1 .  furosemide (LASIX) 40 MG tablet, Take 40 mg every other day by mouth. , Disp: , Rfl:  .  hydrocortisone valerate cream (WESTCORT) 0.2 %, Apply 1 application topically as needed. , Disp: , Rfl:  .  losartan-hydrochlorothiazide (HYZAAR) 50-12.5 MG tablet, TAKE 1 TABLET EVERY DAY, Disp: 90 tablet, Rfl: 3 .  Multiple Vitamins-Minerals (ICAPS AREDS 2 PO), Take 1 tablet by mouth daily., Disp: , Rfl:  .  pantoprazole (PROTONIX) 20 MG tablet, Take 2 caps. Daily in am., Disp: 180 tablet, Rfl: 3 .  pravastatin (PRAVACHOL) 20 MG tablet, TAKE 1 TABLET AT BEDTIME, Disp: 90 tablet, Rfl:  3  Patient Care Team: Dmari Schubring, Vickki Muff, PA as PCP - General (Physician Assistant) Lorelee Cover., MD as Consulting Physician (Ophthalmology) Murlean Iba, MD as Consulting Physician (Internal Medicine) Dasher, Rayvon Char, MD as Consulting Physician (Dermatology) Lavera Guise, MD as Consulting Physician (Internal Medicine) Crowner, Joesph Fillers, MD as Referring Physician (Vascular Surgery) Virgel Manifold, MD as Consulting Physician (Gastroenterology)    Objective:   Vitals: BP (!) 138/54 (BP Location: Right Arm, Patient Position: Sitting, Cuff Size: Normal)   Pulse 60   Temp 98.1 F (36.7 C) (Oral)   Ht 5\' 4"  (1.626 m)   Wt 178 lb 9.6 oz (81 kg)   BMI 30.66 kg/m   Physical Exam  Constitutional: She is oriented to person, place, and time. She appears well-developed and well-nourished.  HENT:  Head: Normocephalic and atraumatic.  Right Ear: External ear normal.  Left Ear:  External ear normal.  Nose: Nose normal.  Mouth/Throat: Oropharynx is clear and moist.  Eyes: Conjunctivae and EOM are normal. Pupils are equal, round, and reactive to light. Right eye exhibits no discharge.  Very poor vision with macular degeneration. Can ascertain light and dark but very difficult to see TV and can't read now. Family constantly with her now.   Neck: Normal range of motion. Neck supple. No tracheal deviation present. No thyromegaly present.  Cardiovascular: Normal rate, regular rhythm and normal heart sounds.  No murmur heard. Pulmonary/Chest: Effort normal and breath sounds normal. No respiratory distress. She has no wheezes. She has no rales. She exhibits no tenderness.  Abdominal: Soft. She exhibits no distension and no mass. There is no tenderness. There is no rebound and no guarding.  Musculoskeletal: Normal range of motion. She exhibits no edema or tenderness.  Scars right leg secondary to fem-pop bypass in 2014. No pain or swelling. Difficult to palpate pulses. No open sores. Skin  tear of the left lower lateral leg from scrap on car door a couple days ago.  Lymphadenopathy:    She has no cervical adenopathy.  Neurological: She is alert and oriented to person, place, and time. She has normal reflexes. No cranial nerve deficit. She exhibits normal muscle tone. Coordination normal.  Skin: Skin is warm and dry. No rash noted. No erythema.  Psychiatric: She has a normal mood and affect. Her behavior is normal. Judgment and thought content normal.    Activities of Daily Living In your present state of health, do you have any difficulty performing the following activities: 01/14/2018 10/16/2017  Hearing? Y Y  Comment Pt declined use of hearing aids or see an audiologist. -  Vision? Y Y  Comment Due to macular degeneration- loss of vision.  -  Difficulty concentrating or making decisions? N N  Walking or climbing stairs? N Y  Dressing or bathing? N N  Doing errands, shopping? Y Y  Comment Does not drive. -  Preparing Food and eating ? N -  Using the Toilet? N -  In the past six months, have you accidently leaked urine? Y -  Comment Occasionally, wears protection. -  Do you have problems with loss of bowel control? N -  Managing your Medications? N -  Managing your Finances? Y -  Comment Due to vision loss.  -  Housekeeping or managing your Housekeeping? N -  Some recent data might be hidden   Fall Risk Assessment Fall Risk  01/14/2018 01/08/2017 02/28/2016  Falls in the past year? No No No   Depression Screen PHQ 2/9 Scores 01/14/2018 01/14/2018 01/08/2017 02/28/2016  PHQ - 2 Score 0 0 0 0  PHQ- 9 Score 5 - - -   Assessment & Plan:    Annual Physical Reviewed patient's Family Medical History Reviewed and updated list of patient's medical providers Assessment of cognitive impairment was done Assessed patient's functional ability Established a written schedule for health screening [place] Completed and Reviewed  Exercise Activities and  Dietary recommendations Goals    . DIET - EAT MORE FRUITS AND VEGETABLES     Recommend increasing amount of fruits in daily diet to 1-2 servings daily.        Immunization History  Administered Date(s) Administered  . Influenza Nasal 01/22/2013  . Influenza Split 09/13/2012  . Influenza, High Dose Seasonal PF 10/01/2014, 08/14/2016, 09/03/2017  . Influenza,inj,Quad PF,6+ Mos 12/06/2015  . Pneumococcal Conjugate-13 11/13/2014  . Pneumococcal Polysaccharide-23 01/08/2017,  10/17/2017    Health Maintenance  Topic Date Due  . TETANUS/TDAP  11/30/2026 (Originally 03/16/1949)  . INFLUENZA VACCINE  Completed  . DEXA SCAN  Completed  . PNA vac Low Risk Adult  Completed   Discussed health benefits of physical activity, and encouraged her to engage in regular exercise appropriate for her age and condition.    ------------------------------------------------------------------------------------------------------------ 1. Essential (primary) hypertension Controlled and stable on Norvasc 10 mg qd and Hyzaar 50-25 mg qd. Uses Lasix 40 mg qod prn swelling. Follow up labs to be done in 3 months by nephrologist.  2. Stage 4 chronic kidney disease ([place]) Followed by Dr. Candiss Norse (nephrologist). On 12-06-17 Hgb was 11.5 and creatinine 2.32.  3. Chronic infection Controlled by Keflex daily as advised by vascular surgeon (Dr. Chipper Herb).  4. H/O arterial bypass of lower limb BLE PAD w/h/o LLE CFA-pop bypass, REIA stent, RLE bypass occlusion f/b Rt CFA - peroneal bypass c/b graft infection, placement of antibiotic beads with wound healing (2014) on long term prophylaxsis with Keflex per Dr. Chipper Herb (vascular specialist). Had by pass 5 years ago and Coumadin has been stopped with history of GI bleed. Denies claudication or edema. Still on Keflex daily to prevent infection in bypass graft and stents.  5. Encounter for Medicare annual wellness exam General health improved since coming off the Coumadin.  Immunizations up to date for her age. With recent GI bleed, gastroenterologist did not recommend colonoscopy and still taking Pantoprazole 20 mg 2 daily. Continue follow up with specialists and may walk for exercise as macular degeneration (legally blind) will allow.  6. Poor sleep pattern Goes to bed around 8-9pm but can't get to sleep or maintain sleep. May try Melatonin 3 mg q hs and/or a little Benadryl.     [name], PA  [facility]

## 2018-01-19 NOTE — Telephone Encounter (Signed)
Pt/daughter declined having CT abdomen/pelvis because her hemoglobin is up as of January and they said that was an indication she was not having any bleeding. Also questioned why this was decided several months later. I tried to explain why but daughter was "unhappy" and put me on speaker so her mother could hear and decide herself and she declined. (this is several weeks out but not months-message was left on 01/14/18).  Encouraged to contact office if pt decides she wants testing.

## 2018-01-21 NOTE — Telephone Encounter (Signed)
I called the patient back and left a HIPPA compliant voicemail requesting a call back. The CT scan would be to rule out any large lesions such as a cancer or mass that could have caused her GI Bleed. Since her GI tract was not ever visualized with an EGD, a CT can be done for evaluation. I will discuss this with pt and family when they call back. However, if they would rather not undergo further testing, I can discuss this with them as well once they call back.

## 2018-01-24 ENCOUNTER — Ambulatory Visit (HOSPITAL_COMMUNITY)
Admission: RE | Admit: 2018-01-24 | Discharge: 2018-01-24 | Disposition: A | Discharge status: Home / Self Care | Payer: Medicare PPO | Source: Ambulatory Visit | Attending: Internal Medicine | Admitting: Internal Medicine

## 2018-01-27 NOTE — Telephone Encounter (Signed)
I talked the patient and her daughter [name] over the phone in detail.  I explained that the CT scan is to rule out any big lesions in her GI tract.  Rule out any malignancy, or big filling defects that could represent big ulcers.  They state, since the patient is feeling well, her hemoglobin has improved, she does not have any abdominal pain, has good appetite, she does not want a CT scan.  The risks of missing big lesions such as malignancy or big ulcers were discussed in detail, and they choose not to undergo the CT scan.  This is reasonable at this time.  They state they will call us if they change their mind, or if symptoms change.

## 2018-02-28 DIAGNOSIS — R609 Edema, unspecified: Secondary | ICD-10-CM | POA: Diagnosis not present

## 2018-02-28 DIAGNOSIS — E211 Secondary hyperparathyroidism, not elsewhere classified: Secondary | ICD-10-CM | POA: Diagnosis not present

## 2018-02-28 DIAGNOSIS — I10 Essential (primary) hypertension: Secondary | ICD-10-CM | POA: Diagnosis not present

## 2018-02-28 DIAGNOSIS — N184 Chronic kidney disease, stage 4 (severe): Secondary | ICD-10-CM | POA: Diagnosis not present

## 2018-02-28 DIAGNOSIS — E872 Acidosis: Secondary | ICD-10-CM | POA: Diagnosis not present

## 2018-03-01 DIAGNOSIS — R609 Edema, unspecified: Secondary | ICD-10-CM | POA: Diagnosis not present

## 2018-03-01 DIAGNOSIS — I10 Essential (primary) hypertension: Secondary | ICD-10-CM | POA: Diagnosis not present

## 2018-03-01 DIAGNOSIS — E872 Acidosis: Secondary | ICD-10-CM | POA: Diagnosis not present

## 2018-03-01 DIAGNOSIS — N184 Chronic kidney disease, stage 4 (severe): Secondary | ICD-10-CM | POA: Diagnosis not present

## 2018-03-01 DIAGNOSIS — E211 Secondary hyperparathyroidism, not elsewhere classified: Secondary | ICD-10-CM | POA: Diagnosis not present

## 2018-03-03 DIAGNOSIS — R609 Edema, unspecified: Secondary | ICD-10-CM | POA: Diagnosis not present

## 2018-03-03 DIAGNOSIS — N184 Chronic kidney disease, stage 4 (severe): Secondary | ICD-10-CM | POA: Diagnosis not present

## 2018-03-03 DIAGNOSIS — I129 Hypertensive chronic kidney disease with stage 1 through stage 4 chronic kidney disease, or unspecified chronic kidney disease: Secondary | ICD-10-CM | POA: Diagnosis not present

## 2018-03-03 DIAGNOSIS — N2581 Secondary hyperparathyroidism of renal origin: Secondary | ICD-10-CM | POA: Diagnosis not present

## 2018-03-21 ENCOUNTER — Encounter: Payer: Self-pay | Admitting: Gastroenterology

## 2018-03-21 ENCOUNTER — Other Ambulatory Visit: Payer: Self-pay

## 2018-03-21 ENCOUNTER — Ambulatory Visit: Payer: Medicare PPO | Admitting: Gastroenterology

## 2018-03-21 VITALS — BP 163/51 | HR 64 | Temp 97.7°F | Ht 64.0 in | Wt 178.0 lb

## 2018-03-21 DIAGNOSIS — D62 Acute posthemorrhagic anemia: Secondary | ICD-10-CM | POA: Diagnosis not present

## 2018-03-21 DIAGNOSIS — K922 Gastrointestinal hemorrhage, unspecified: Secondary | ICD-10-CM | POA: Diagnosis not present

## 2018-03-21 NOTE — Progress Notes (Signed)
Vonda Antigua, MD [address]  [place]  [place],  [address]  Main: [phone]  Fax: [phone]   Primary Care Physician: Margo Common, [place]  Primary Gastroenterologist:  Dr. Vonda Antigua  Chief Complaint  Patient presents with  . Follow-up    GI bleed--No c/o rectal bleeding, feeling well    HPI: Lori Johnson is a 84 y.o. female here for follow-up of anemia and hematemesis, while on Coumadin, and November 2018.  Since then, patient has been off Coumadin, and no plans on restarting Coumadin as per vascular surgery.  Denies any abdominal pain.  No melena, no hematochezia, no hematemesis.  Good appetite, no nausea vomiting.  Is doing very well, family in room, and states patient has not had any complaints.  We had offered her a CT scan on last visit, but patient and family declined.  The CT scan was to noninvasively evaluate her gastric anatomy, to rule out any underlying malignancies, or peptic ulcer disease that may have caused her hematemesis in the hospital.  However, she declined the imaging since she is feeling well and is not having any symptoms.  Previous history as per my previous notes: "admitted to the hospital on November 17 due to hematemesis. Patient was on Coumadin, with a supratherapeutic INR at 3.2, due to peripheral vascular disease and stenting. During the admission, her hematemesis resolved with IV PPI and correction of INR, and due to an elevated troponin to 4.5 acutely during the admission, endoscopy was high risk and was not performed. Patient's Coumadin has been held since the admission. Her hemoglobin at discharge was 9.1 and was 7.6 when she was admitted. She was seen by cardiology who recommended conservative management for NSTEMI. "    Current Outpatient Medications  Medication Sig Dispense Refill  . Acetaminophen (TYLENOL ARTHRITIS PAIN PO) Take by mouth as needed.     [name] albuterol (PROVENTIL HFA;VENTOLIN HFA) 108 (90 Base)  MCG/ACT inhaler TAKE 2 PUFFS BY MOUTH EVERY 6 HOURS AS NEEDED FOR WHEEZE OR SHORTNESS OF BREATH 18 Inhaler 0  . amLODipine (NORVASC) 10 MG tablet TAKE 1 TABLET EVERY DAY 90 tablet 3  . aspirin EC 81 MG tablet Take 81 mg by mouth daily.    . cephALEXin (KEFLEX) 500 MG capsule Take 1 capsule (500 mg total) by mouth daily. 90 capsule 1  . furosemide (LASIX) 40 MG tablet Take 40 mg every other day by mouth.     . hydrocortisone valerate cream (WESTCORT) 0.2 % Apply 1 application topically as needed.     [name] losartan-hydrochlorothiazide (HYZAAR) 50-12.5 MG tablet TAKE 1 TABLET EVERY DAY 90 tablet 3  . Multiple Vitamins-Minerals (ICAPS AREDS 2 PO) Take 1 tablet by mouth daily.    . pantoprazole (PROTONIX) 20 MG tablet Take 2 caps. Daily in am. 180 tablet 3  . pravastatin (PRAVACHOL) 20 MG tablet TAKE 1 TABLET AT BEDTIME 90 tablet 3   No current facility-administered medications for this visit.     Allergies as of 03/21/2018 - Review Complete 03/21/2018  Allergen Reaction Noted  . Pletal [cilostazol] Swelling 03/31/2015  . Erythromycin Hives 03/31/2015  . Codeine Nausea Only and Nausea And Vomiting 03/31/2015  . Erythromycin base Itching and Rash 05/06/2015  . Penicillins Hives, Itching, and Rash 03/31/2015  . Sulfa antibiotics Rash 03/31/2015    ROS:  General: Negative for anorexia, weight loss, fever, chills, fatigue, weakness. ENT: Negative for hoarseness, difficulty swallowing , nasal congestion. CV: Negative for chest pain, angina, palpitations, dyspnea  on exertion, peripheral edema.  Respiratory: Negative for dyspnea at rest, dyspnea on exertion, cough, sputum, wheezing.  GI: See history of present illness. GU:  Negative for dysuria, hematuria, urinary incontinence, urinary frequency, nocturnal urination.  Endo: Negative for unusual weight change.    Physical Examination:   BP (!) 163/51   Pulse 64   Temp 97.7 F (36.5 C) (Oral)   Ht 5\' 4"  (1.626 m)   Wt 178 lb (80.7 kg)   BMI  30.55 kg/m   General: Well-nourished, well-developed in no acute distress.  Eyes: No icterus. Conjunctivae pink. Mouth: Oropharyngeal mucosa moist and pink , no lesions erythema or exudate. Neck: Supple, Trachea midline Abdomen: Bowel sounds are normal, nontender, nondistended, no hepatosplenomegaly or masses, no abdominal bruits or hernia , no rebound or guarding.   Extremities: No lower extremity edema. No clubbing or deformities. Neuro: Alert and oriented x 3.  Grossly intact. Skin: Warm and dry, no jaundice.   Psych: Alert and cooperative, normal mood and affect.   Labs: CMP     Component Value Date/Time   NA 141 10/29/2017 1104   NA 142 07/12/2017   NA 139 12/06/2014 1828   K 4.0 10/29/2017 1104   K 4.4 12/06/2014 1828   CL 100 10/29/2017 1104   CL 107 12/06/2014 1828   CO2 26 10/29/2017 1104   CO2 25 12/06/2014 1828   GLUCOSE 109 (H) 10/29/2017 1104   GLUCOSE 106 (H) 12/06/2014 1828   BUN 23 10/29/2017 1104   BUN 37 (A) 07/12/2017   BUN 33 (H) 12/06/2014 1828   CREATININE 1.98 (H) 10/29/2017 1104   CALCIUM 9.1 10/29/2017 1104   CALCIUM 8.9 12/06/2014 1828   PROT 6.2 10/29/2017 1104   PROT 7.0 05/15/2016 1040   PROT 7.9 12/06/2014 1828   ALBUMIN 2.8 (L) 10/16/2017 0604   ALBUMIN 4.2 05/15/2016 1040   ALBUMIN 3.9 12/06/2014 1828   AST 19 10/29/2017 1104   AST 29 12/06/2014 1828   ALT 10 10/29/2017 1104   ALT 24 12/06/2014 1828   ALKPHOS 46 10/16/2017 0604   ALKPHOS 97 12/06/2014 1828   BILITOT 0.7 10/29/2017 1104   BILITOT 0.5 05/15/2016 1040   BILITOT 0.5 12/06/2014 1828   GFRNONAA 22 (L) 10/29/2017 1104   GFRAA 26 (L) 10/29/2017 1104   Lab Results  Component Value Date   WBC 6.5 10/29/2017   HGB 9.4 (L) 10/29/2017   HCT 28.5 (L) 10/29/2017   MCV 90.2 10/29/2017   PLT 314 10/29/2017    Imaging Studies: No results found.  Assessment and Plan:   Lori Johnson is a 84 y.o. y/o female here for follow-up of hematemesis and anemia that occurred in  November 2017 in the setting of Coumadin use, and NSTEMI with elevated troponin to 4.5 during that admission, with improved: Hemoglobin, and no evidence of clinical GI bleed since hospital discharge  Last hemoglobin was on April 2 done by Dr. Candiss Norse from nephrology, and it was 10.7 This is available under media and scanned chart We discussed doing a CT scan again, to rule out any gastric malignancy, or underlying peptic ulcer disease However, since patient is not having any symptoms, they refused any further testing at this time This is reasonable Continue Protonix 20 mg daily, low-dose, since patient is on aspirin indefinitely, the Protonix will help protect from any peptic ulcer disease.  Benefits of low-dose Protonix, outweigh risks at this time. Patient and family informed of alarm symptoms to come to the ER with,  or to inform us with They would like to continue to follow-up with nephrology and their primary care provider.  This is reasonable I have given them a copy of the patient's last hemoglobin at 10.7, and since she is getting blood work with nephrology every 3 months, I have asked her to keep an eye on this hemoglobin, and if this drops, to inform us.  She verbalized understanding.  She has our contact number, and will follow up with [place] as needed  Dr Vonda Antigua

## 2018-04-04 DIAGNOSIS — Z85828 Personal history of other malignant neoplasm of skin: Secondary | ICD-10-CM | POA: Diagnosis not present

## 2018-04-04 DIAGNOSIS — D485 Neoplasm of uncertain behavior of skin: Secondary | ICD-10-CM | POA: Diagnosis not present

## 2018-04-04 DIAGNOSIS — D692 Other nonthrombocytopenic purpura: Secondary | ICD-10-CM | POA: Diagnosis not present

## 2018-04-04 DIAGNOSIS — L308 Other specified dermatitis: Secondary | ICD-10-CM | POA: Diagnosis not present

## 2018-04-04 DIAGNOSIS — Z08 Encounter for follow-up examination after completed treatment for malignant neoplasm: Secondary | ICD-10-CM | POA: Diagnosis not present

## 2018-04-04 DIAGNOSIS — D0462 Carcinoma in situ of skin of left upper limb, including shoulder: Secondary | ICD-10-CM | POA: Diagnosis not present

## 2018-04-19 DIAGNOSIS — D0462 Carcinoma in situ of skin of left upper limb, including shoulder: Secondary | ICD-10-CM | POA: Diagnosis not present

## 2018-05-09 ENCOUNTER — Other Ambulatory Visit: Payer: Self-pay | Admitting: Family Medicine

## 2018-05-09 DIAGNOSIS — Z95828 Presence of other vascular implants and grafts: Secondary | ICD-10-CM

## 2018-05-09 DIAGNOSIS — B999 Unspecified infectious disease: Secondary | ICD-10-CM

## 2018-05-16 DIAGNOSIS — D649 Anemia, unspecified: Secondary | ICD-10-CM | POA: Diagnosis not present

## 2018-05-16 DIAGNOSIS — R748 Abnormal levels of other serum enzymes: Secondary | ICD-10-CM | POA: Diagnosis not present

## 2018-05-16 DIAGNOSIS — Z8719 Personal history of other diseases of the digestive system: Secondary | ICD-10-CM | POA: Diagnosis not present

## 2018-05-16 DIAGNOSIS — I739 Peripheral vascular disease, unspecified: Secondary | ICD-10-CM | POA: Diagnosis not present

## 2018-05-16 DIAGNOSIS — E782 Mixed hyperlipidemia: Secondary | ICD-10-CM | POA: Diagnosis not present

## 2018-05-16 DIAGNOSIS — I10 Essential (primary) hypertension: Secondary | ICD-10-CM | POA: Diagnosis not present

## 2018-05-16 DIAGNOSIS — I2510 Atherosclerotic heart disease of native coronary artery without angina pectoris: Secondary | ICD-10-CM | POA: Diagnosis not present

## 2018-05-16 DIAGNOSIS — I350 Nonrheumatic aortic (valve) stenosis: Secondary | ICD-10-CM | POA: Diagnosis not present

## 2018-05-19 ENCOUNTER — Ambulatory Visit: Payer: Medicare PPO | Admitting: Family Medicine

## 2018-05-19 ENCOUNTER — Encounter: Payer: Self-pay | Admitting: Family Medicine

## 2018-05-19 ENCOUNTER — Ambulatory Visit
Admission: RE | Admit: 2018-05-19 | Discharge: 2018-05-19 | Disposition: A | Discharge status: Home / Self Care | Payer: Medicare PPO | Source: Ambulatory Visit | Attending: Family Medicine | Admitting: Family Medicine

## 2018-05-19 ENCOUNTER — Telehealth: Payer: Self-pay | Admitting: Family Medicine

## 2018-05-19 VITALS — BP 152/62 | HR 61 | Temp 98.5°F | Wt 173.0 lb

## 2018-05-19 DIAGNOSIS — I700 Atherosclerosis of aorta: Secondary | ICD-10-CM | POA: Diagnosis not present

## 2018-05-19 DIAGNOSIS — J209 Acute bronchitis, unspecified: Secondary | ICD-10-CM | POA: Diagnosis not present

## 2018-05-19 DIAGNOSIS — R05 Cough: Secondary | ICD-10-CM | POA: Diagnosis not present

## 2018-05-19 MED ORDER — DOXYCYCLINE HYCLATE 100 MG PO TABS: 100.0000 mg | ORAL_TABLET | Freq: Two times a day (BID) | ORAL | 0 refills | Status: DC

## 2018-05-19 NOTE — Telephone Encounter (Signed)
Pt's daughter call stating that her mother just missed phone call from our office.She would like to get x ray results

## 2018-05-19 NOTE — Progress Notes (Signed)
Patient: Lori Johnson Female    DOB: 1930-08-12   84 y.o.   MRN: [id number] Visit Date: 05/19/2018  Today's Provider: [name], PA   Chief Complaint  Patient presents with  . Cough   Subjective:    Cough  This is a new problem. Episode onset: approximately 2 weeks ago. The problem has been gradually worsening. The problem occurs every few minutes. The cough is productive of sputum. Associated symptoms comments: congestion. Treatments tried: cough drops, Claritin and OTC cough suppressant. The treatment provided mild relief.   Past Medical History:  Diagnosis Date  . Angiopathy, peripheral ([place])   . Arterial embolus and thrombosis ([place])   . ASCVD (arteriosclerotic cardiovascular disease)    with stent RCA  . Bilateral bunions   . Cancer ([place])    SCC removed off left leg  . Cervical lymphadenitis   . Chronic gout of left ankle due to renal impairment without tophus   . Chronic infection June 2014   Has chronic infection of right femoral graft with history of extensive debridement in June 2014. Was on oral Clindamycin for 3 weeks for culture positive for anerobes and staph lugdunensis. Vascular Surgeon at [facility] Remonia Richter, MD) recommended indefinite suppressive therapy with Cephalexin 500 mg qd and reassessment every 3 months. Will refill Cephalexin.)  . Chronic kidney disease   . CRF (chronic renal failure)   . GERD (gastroesophageal reflux disease)   . Hiatal hernia   . History of arterial bypass of lower extremity   . Hyperlipidemia   . Hypertension   . Hyperuricemia   . Macular degeneration   . Mass of right side of neck   . Nocturia   . OA (osteoarthritis)   . PVD (peripheral vascular disease) ([place])   . Renal disorder    Past Surgical History:  Procedure Laterality Date  . ABDOMINAL HYSTERECTOMY  1981  . arterial bypass right leg Right   . broken left foot Left 01/2002  . CHOLECYSTECTOMY    . CORONARY ANGIOPLASTY WITH STENT PLACEMENT    . stent in leg      Family History  Problem Relation Age of Onset  . Heart disease Mother   . Heart disease Father    Allergies  Allergen Reactions  . Pletal [Cilostazol] Swelling  . Erythromycin Hives  . Codeine Nausea Only and Nausea And Vomiting  . Erythromycin Base Itching and Rash  . Penicillins Hives, Itching and Rash  . Sulfa Antibiotics Rash    Current Outpatient Medications:  .  Acetaminophen (TYLENOL ARTHRITIS PAIN PO), Take by mouth as needed. , Disp: , Rfl:  .  albuterol (PROVENTIL HFA;VENTOLIN HFA) 108 (90 Base) MCG/ACT inhaler, TAKE 2 PUFFS BY MOUTH EVERY 6 HOURS AS NEEDED FOR WHEEZE OR SHORTNESS OF BREATH, Disp: 18 Inhaler, Rfl: 0 .  amLODipine (NORVASC) 10 MG tablet, TAKE 1 TABLET EVERY DAY, Disp: 90 tablet, Rfl: 3 .  aspirin EC 81 MG tablet, Take 81 mg by mouth daily., Disp: , Rfl:  .  cephALEXin (KEFLEX) 500 MG capsule, TAKE 1 CAPSULE (500 MG TOTAL) BY MOUTH DAILY., Disp: 90 capsule, Rfl: 1 .  furosemide (LASIX) 40 MG tablet, Take 40 mg every other day by mouth. , Disp: , Rfl:  .  hydrocortisone valerate cream (WESTCORT) 0.2 %, Apply 1 application topically as needed. , Disp: , Rfl:  .  losartan-hydrochlorothiazide (HYZAAR) 50-12.5 MG tablet, TAKE 1 TABLET EVERY DAY, Disp: 90 tablet, Rfl: 3 .  Multiple Vitamins-Minerals (  ICAPS AREDS 2 PO), Take 1 tablet by mouth daily., Disp: , Rfl:  .  pantoprazole (PROTONIX) 20 MG tablet, Take 2 caps. Daily in am., Disp: 180 tablet, Rfl: 3 .  pravastatin (PRAVACHOL) 20 MG tablet, TAKE 1 TABLET AT BEDTIME, Disp: 90 tablet, Rfl: 3  Review of Systems  Constitutional: Positive for fatigue.  HENT: Positive for congestion.   Respiratory: Positive for cough.   Cardiovascular: Negative.    Social History   Tobacco Use  . Smoking status: Former Smoker    Types: Cigarettes  . Smokeless tobacco: Never Used  . Tobacco comment: quit >30-40 years ago  Substance Use Topics  . Alcohol use: No   Objective:   BP (!) 152/62 (BP Location: Right Arm,  Patient Position: Sitting, Cuff Size: Normal)   Pulse 61   Temp 98.5 F (36.9 C) (Oral)   Wt 173 lb (78.5 kg)   SpO2 99%   BMI 29.70 kg/m   Physical Exam  Constitutional: She is oriented to person, place, and time. She appears well-developed and well-nourished. No distress.  HENT:  Head: Normocephalic and atraumatic.  Right Ear: Hearing normal.  Left Ear: Hearing normal.  Nose: Nose normal.  Eyes: Conjunctivae and lids are normal. Right eye exhibits no discharge. Left eye exhibits no discharge. No scleral icterus.  Cardiovascular: Normal rate.  Pulmonary/Chest: Effort normal. No respiratory distress.  Rhonchi with questionable left posterior base rales. No chest pains or wheezing.  Musculoskeletal: Normal range of motion.  Neurological: She is alert and oriented to person, place, and time.  Skin: Skin is intact. No lesion and no rash noted.  Psychiatric: She has a normal mood and affect. Her speech is normal and behavior is normal. Thought content normal.      Assessment & Plan:     1. Acute bronchitis, unspecified organism Onset the past 3 days that started with nasal congestion, slight sore throat and PND. Bubbly rhonchi scattered with questionable rales in the left posterior base. Will get CBC and CXR to rule out pneumonia onset. No fever and normal pulse oximetry at 99%. Continue Tussin and Claritin prn. Has Albuterol inhaler at home to use if wheezing develops. Add Doxycycline and recheck pending diagnostic test results. - CBC with Differential/Platelet - DG Chest 2 View - doxycycline (VIBRA-TABS) 100 MG tablet; Take 1 tablet (100 mg total) by mouth 2 (two) times daily.  Dispense: 20 tablet; Refill: [place], PA  [facility]

## 2018-05-19 NOTE — Telephone Encounter (Signed)
Patient daughter [name] advised of x ray report.

## 2018-05-20 ENCOUNTER — Telehealth: Payer: Self-pay

## 2018-05-20 LAB — CBC WITH DIFFERENTIAL/PLATELET
BASOS ABS: 0 10*3/uL (ref 0.0–0.2)
Basos: 1 %
EOS (ABSOLUTE): 0.3 10*3/uL (ref 0.0–0.4)
Eos: 7 %
HEMATOCRIT: 36.4 % (ref 34.0–46.6)
Hemoglobin: 11.9 g/dL (ref 11.1–15.9)
Immature Grans (Abs): 0 10*3/uL (ref 0.0–0.1)
Immature Granulocytes: 0 %
LYMPHS: 31 %
Lymphocytes Absolute: 1.4 10*3/uL (ref 0.7–3.1)
MCH: 29.2 pg (ref 26.6–33.0)
MCHC: 32.7 g/dL (ref 31.5–35.7)
MCV: 89 fL (ref 79–97)
MONOS ABS: 1 10*3/uL — AB (ref 0.1–0.9)
Monocytes: 23 %
Neutrophils Absolute: 1.8 10*3/uL (ref 1.4–7.0)
Neutrophils: 38 %
PLATELETS: 196 10*3/uL (ref 150–450)
RBC: 4.08 x10E6/uL (ref 3.77–5.28)
RDW: 13.9 % (ref 12.3–15.4)
WBC: 4.5 10*3/uL (ref 3.4–10.8)

## 2018-05-20 NOTE — Telephone Encounter (Signed)
Pt returned call ° °teri °

## 2018-05-20 NOTE — Telephone Encounter (Signed)
-----   Message from Margo Common, [place] sent at 05/20/2018  3:35 PM EDT ----- Normal blood counts. No sign of anemia or pneumonia infection. Finish all the antibiotic and recheck in 10 days if no better or needed.

## 2018-05-20 NOTE — Telephone Encounter (Signed)
LMTCB

## 2018-05-23 NOTE — Telephone Encounter (Signed)
Patient's daughter [name] advised.

## 2018-05-30 ENCOUNTER — Telehealth: Payer: Self-pay | Admitting: Family Medicine

## 2018-05-30 MED ORDER — LEVOFLOXACIN 500 MG PO TABS: 500.0000 mg | ORAL_TABLET | Freq: Every day | ORAL | 0 refills | Status: DC

## 2018-05-30 NOTE — Telephone Encounter (Signed)
Patient's daughter [name] advised. RX sent [facility]. Follow up appointment scheduled.

## 2018-05-30 NOTE — Telephone Encounter (Signed)
Will go to a stronger antibiotic (Levaquin 500 mg qd #7) and should schedule follow up appointment in 1 week. Sooner if needed.

## 2018-05-30 NOTE — Telephone Encounter (Signed)
Patient's daughter called in stating that patient was seen on 05/19/2018 and was treated for bronchitis.  She finished the antibiotic on Saturday evening and is still really congested and coughing a lot.  She is a little better than when she was seen.  [facility] [place].

## 2018-06-06 DIAGNOSIS — N184 Chronic kidney disease, stage 4 (severe): Secondary | ICD-10-CM | POA: Diagnosis not present

## 2018-06-06 DIAGNOSIS — E872 Acidosis: Secondary | ICD-10-CM | POA: Diagnosis not present

## 2018-06-06 DIAGNOSIS — I129 Hypertensive chronic kidney disease with stage 1 through stage 4 chronic kidney disease, or unspecified chronic kidney disease: Secondary | ICD-10-CM | POA: Diagnosis not present

## 2018-06-06 DIAGNOSIS — R609 Edema, unspecified: Secondary | ICD-10-CM | POA: Diagnosis not present

## 2018-06-06 DIAGNOSIS — N2581 Secondary hyperparathyroidism of renal origin: Secondary | ICD-10-CM | POA: Diagnosis not present

## 2018-06-07 ENCOUNTER — Ambulatory Visit: Payer: Medicare PPO | Admitting: Family Medicine

## 2018-06-07 ENCOUNTER — Encounter: Payer: Self-pay | Admitting: Family Medicine

## 2018-06-07 VITALS — BP 118/52 | HR 55 | Temp 97.9°F | Wt 173.2 lb

## 2018-06-07 DIAGNOSIS — J40 Bronchitis, not specified as acute or chronic: Secondary | ICD-10-CM

## 2018-06-07 NOTE — Progress Notes (Signed)
Patient: Lori Johnson Female    DOB: March 28, 1930   84 y.o.   MRN: [id number] Visit Date: 06/07/2018  Today's Provider: [name], PA   Chief Complaint  Patient presents with  . Bronchitis    follow up    Subjective:    HPI Bronchitis:  Patient presents today for a follow up. Last OV was on 05/19/18. CXR and labs were done. Results were normal. Patient advised to continue Tussin and Claritin PRN. Added Doxycycline 100 mg BID. She reports fair compliance with treatment plan. Medication was changed to Levaquin 500 mg due to side effects of Doxycycline. Symptoms are unchanged.     Past Medical History:  Diagnosis Date  . Angiopathy, peripheral ([place])   . Arterial embolus and thrombosis ([place])   . ASCVD (arteriosclerotic cardiovascular disease)    with stent RCA  . Bilateral bunions   . Cancer ([place])    SCC removed off left leg  . Cervical lymphadenitis   . Chronic gout of left ankle due to renal impairment without tophus   . Chronic infection June 2014   Has chronic infection of right femoral graft with history of extensive debridement in June 2014. Was on oral Clindamycin for 3 weeks for culture positive for anerobes and staph lugdunensis. Vascular Surgeon at [facility] Remonia Richter, MD) recommended indefinite suppressive therapy with Cephalexin 500 mg qd and reassessment every 3 months. Will refill Cephalexin.)  . Chronic kidney disease   . CRF (chronic renal failure)   . GERD (gastroesophageal reflux disease)   . Hiatal hernia   . History of arterial bypass of lower extremity   . Hyperlipidemia   . Hypertension   . Hyperuricemia   . Macular degeneration   . Mass of right side of neck   . Nocturia   . OA (osteoarthritis)   . PVD (peripheral vascular disease) ([place])   . Renal disorder    Past Surgical History:  Procedure Laterality Date  . ABDOMINAL HYSTERECTOMY  1981  . arterial bypass right leg Right   . broken left foot Left 01/2002  . CHOLECYSTECTOMY    . CORONARY  ANGIOPLASTY WITH STENT PLACEMENT    . stent in leg     Family History  Problem Relation Age of Onset  . Heart disease Mother   . Heart disease Father    Allergies  Allergen Reactions  . Pletal [Cilostazol] Swelling  . Erythromycin Hives  . Codeine Nausea Only and Nausea And Vomiting  . Erythromycin Base Itching and Rash  . Penicillins Hives, Itching and Rash  . Sulfa Antibiotics Rash    Current Outpatient Medications:  .  Acetaminophen (TYLENOL ARTHRITIS PAIN PO), Take by mouth as needed. , Disp: , Rfl:  .  albuterol (PROVENTIL HFA;VENTOLIN HFA) 108 (90 Base) MCG/ACT inhaler, TAKE 2 PUFFS BY MOUTH EVERY 6 HOURS AS NEEDED FOR WHEEZE OR SHORTNESS OF BREATH, Disp: 18 Inhaler, Rfl: 0 .  amLODipine (NORVASC) 10 MG tablet, TAKE 1 TABLET EVERY DAY, Disp: 90 tablet, Rfl: 3 .  aspirin EC 81 MG tablet, Take 81 mg by mouth daily., Disp: , Rfl:  .  cephALEXin (KEFLEX) 500 MG capsule, TAKE 1 CAPSULE (500 MG TOTAL) BY MOUTH DAILY., Disp: 90 capsule, Rfl: 1 .  furosemide (LASIX) 40 MG tablet, Take 40 mg every other day by mouth. , Disp: , Rfl:  .  hydrocortisone valerate cream (WESTCORT) 0.2 %, Apply 1 application topically as needed. , Disp: , Rfl:  .  losartan-hydrochlorothiazide (HYZAAR) 50-12.5  MG tablet, TAKE 1 TABLET EVERY DAY, Disp: 90 tablet, Rfl: 3 .  Multiple Vitamins-Minerals (ICAPS AREDS 2 PO), Take 1 tablet by mouth daily., Disp: , Rfl:  .  pantoprazole (PROTONIX) 20 MG tablet, Take 2 caps. Daily in am., Disp: 180 tablet, Rfl: 3 .  pravastatin (PRAVACHOL) 20 MG tablet, TAKE 1 TABLET AT BEDTIME, Disp: 90 tablet, Rfl: 3  Review of Systems  Constitutional: Negative.   HENT: Positive for congestion and postnasal drip.   Respiratory: Positive for cough.   Cardiovascular: Negative.   Gastrointestinal: Positive for nausea.  Musculoskeletal: Positive for gait problem.   Social History   Tobacco Use  . Smoking status: Former Smoker    Types: Cigarettes  . Smokeless tobacco: Never  Used  . Tobacco comment: quit >30-40 years ago  Substance Use Topics  . Alcohol use: No   Objective:   BP (!) 118/52 (BP Location: Right Arm, Patient Position: Sitting, Cuff Size: Normal)   Pulse (!) 55   Temp 97.9 F (36.6 C) (Oral)   Wt 173 lb 3.2 oz (78.6 kg)   SpO2 99%   BMI 29.73 kg/m  Wt Readings from Last 3 Encounters:  06/07/18 173 lb 3.2 oz (78.6 kg)  05/19/18 173 lb (78.5 kg)  03/21/18 178 lb (80.7 kg)   BP Readings from Last 3 Encounters:  06/07/18 (!) 118/52  05/19/18 (!) 152/62  03/21/18 (!) 163/51   Physical Exam  Constitutional: She is oriented to person, place, and time. She appears well-developed and well-nourished. No distress.  HENT:  Head: Normocephalic and atraumatic.  Right Ear: Hearing and external ear normal.  Left Ear: Hearing and external ear normal.  Nose: Nose normal.  Mouth/Throat: Oropharynx is clear and moist.  Hearing diminished. Normal TM's.  Eyes: Conjunctivae and lids are normal. Right eye exhibits no discharge. Left eye exhibits no discharge. No scleral icterus.  Cardiovascular: Normal rate and regular rhythm.  Pulmonary/Chest: Effort normal and breath sounds normal. No respiratory distress.  Musculoskeletal: Normal range of motion.  Neurological: She is alert and oriented to person, place, and time.  Skin: Skin is intact. No lesion and no rash noted.  Psychiatric: She has a normal mood and affect. Her speech is normal and behavior is normal. Thought content normal.      Assessment & Plan:     1. Bronchitis Finished the Levaquin but still has occasional cough. No sputum production or fever. Stopped the Claritin for allergic rhinitis and feels she has some PND. Recommend restarting the Claritin, increase fluid intake, take multivitamin with minerals, use Pantoprazole to control GERD and may use Proventil-HFA prn wheeze. Recheck prn. Proceed with recheck with Dr. Candiss Norse (nephrologist) this week.       [name], PA  [facility]

## 2018-06-08 DIAGNOSIS — I129 Hypertensive chronic kidney disease with stage 1 through stage 4 chronic kidney disease, or unspecified chronic kidney disease: Secondary | ICD-10-CM | POA: Diagnosis not present

## 2018-06-08 DIAGNOSIS — N184 Chronic kidney disease, stage 4 (severe): Secondary | ICD-10-CM | POA: Diagnosis not present

## 2018-06-08 DIAGNOSIS — N2581 Secondary hyperparathyroidism of renal origin: Secondary | ICD-10-CM | POA: Diagnosis not present

## 2018-06-15 DIAGNOSIS — M79674 Pain in right toe(s): Secondary | ICD-10-CM | POA: Diagnosis not present

## 2018-06-15 DIAGNOSIS — B351 Tinea unguium: Secondary | ICD-10-CM | POA: Diagnosis not present

## 2018-06-15 DIAGNOSIS — M79675 Pain in left toe(s): Secondary | ICD-10-CM | POA: Diagnosis not present

## 2018-06-21 DIAGNOSIS — I129 Hypertensive chronic kidney disease with stage 1 through stage 4 chronic kidney disease, or unspecified chronic kidney disease: Secondary | ICD-10-CM | POA: Diagnosis not present

## 2018-06-21 DIAGNOSIS — Z87891 Personal history of nicotine dependence: Secondary | ICD-10-CM | POA: Diagnosis not present

## 2018-06-21 DIAGNOSIS — Z7982 Long term (current) use of aspirin: Secondary | ICD-10-CM | POA: Diagnosis not present

## 2018-06-21 DIAGNOSIS — I739 Peripheral vascular disease, unspecified: Secondary | ICD-10-CM | POA: Diagnosis not present

## 2018-06-21 DIAGNOSIS — N183 Chronic kidney disease, stage 3 (moderate): Secondary | ICD-10-CM | POA: Diagnosis not present

## 2018-06-21 DIAGNOSIS — I2510 Atherosclerotic heart disease of native coronary artery without angina pectoris: Secondary | ICD-10-CM | POA: Diagnosis not present

## 2018-09-01 ENCOUNTER — Encounter: Payer: Self-pay | Admitting: Family Medicine

## 2018-09-01 DIAGNOSIS — R609 Edema, unspecified: Secondary | ICD-10-CM | POA: Diagnosis not present

## 2018-09-01 DIAGNOSIS — N184 Chronic kidney disease, stage 4 (severe): Secondary | ICD-10-CM | POA: Diagnosis not present

## 2018-09-01 DIAGNOSIS — E872 Acidosis: Secondary | ICD-10-CM | POA: Diagnosis not present

## 2018-09-01 DIAGNOSIS — I129 Hypertensive chronic kidney disease with stage 1 through stage 4 chronic kidney disease, or unspecified chronic kidney disease: Secondary | ICD-10-CM | POA: Diagnosis not present

## 2018-09-01 DIAGNOSIS — N2581 Secondary hyperparathyroidism of renal origin: Secondary | ICD-10-CM | POA: Diagnosis not present

## 2018-09-07 DIAGNOSIS — R609 Edema, unspecified: Secondary | ICD-10-CM | POA: Diagnosis not present

## 2018-09-07 DIAGNOSIS — N184 Chronic kidney disease, stage 4 (severe): Secondary | ICD-10-CM | POA: Diagnosis not present

## 2018-09-07 DIAGNOSIS — I129 Hypertensive chronic kidney disease with stage 1 through stage 4 chronic kidney disease, or unspecified chronic kidney disease: Secondary | ICD-10-CM | POA: Diagnosis not present

## 2018-09-19 DIAGNOSIS — L853 Xerosis cutis: Secondary | ICD-10-CM | POA: Diagnosis not present

## 2018-09-19 DIAGNOSIS — Z85828 Personal history of other malignant neoplasm of skin: Secondary | ICD-10-CM | POA: Diagnosis not present

## 2018-09-19 DIAGNOSIS — X32XXXA Exposure to sunlight, initial encounter: Secondary | ICD-10-CM | POA: Diagnosis not present

## 2018-09-19 DIAGNOSIS — L570 Actinic keratosis: Secondary | ICD-10-CM | POA: Diagnosis not present

## 2018-09-19 DIAGNOSIS — Z08 Encounter for follow-up examination after completed treatment for malignant neoplasm: Secondary | ICD-10-CM | POA: Diagnosis not present

## 2018-10-27 ENCOUNTER — Other Ambulatory Visit: Payer: Self-pay | Admitting: Family Medicine

## 2018-10-27 DIAGNOSIS — B999 Unspecified infectious disease: Secondary | ICD-10-CM

## 2018-10-27 DIAGNOSIS — Z95828 Presence of other vascular implants and grafts: Secondary | ICD-10-CM

## 2018-11-03 ENCOUNTER — Encounter: Payer: Self-pay | Admitting: Family Medicine

## 2018-11-03 DIAGNOSIS — N184 Chronic kidney disease, stage 4 (severe): Secondary | ICD-10-CM | POA: Diagnosis not present

## 2018-11-03 DIAGNOSIS — E872 Acidosis: Secondary | ICD-10-CM | POA: Diagnosis not present

## 2018-11-03 DIAGNOSIS — R609 Edema, unspecified: Secondary | ICD-10-CM | POA: Diagnosis not present

## 2018-11-03 DIAGNOSIS — I129 Hypertensive chronic kidney disease with stage 1 through stage 4 chronic kidney disease, or unspecified chronic kidney disease: Secondary | ICD-10-CM | POA: Diagnosis not present

## 2018-11-03 DIAGNOSIS — N2581 Secondary hyperparathyroidism of renal origin: Secondary | ICD-10-CM | POA: Diagnosis not present

## 2018-11-09 DIAGNOSIS — N2581 Secondary hyperparathyroidism of renal origin: Secondary | ICD-10-CM | POA: Diagnosis not present

## 2018-11-09 DIAGNOSIS — I129 Hypertensive chronic kidney disease with stage 1 through stage 4 chronic kidney disease, or unspecified chronic kidney disease: Secondary | ICD-10-CM | POA: Diagnosis not present

## 2018-11-09 DIAGNOSIS — N390 Urinary tract infection, site not specified: Secondary | ICD-10-CM | POA: Diagnosis not present

## 2018-11-09 DIAGNOSIS — M109 Gout, unspecified: Secondary | ICD-10-CM | POA: Diagnosis not present

## 2018-11-09 DIAGNOSIS — N184 Chronic kidney disease, stage 4 (severe): Secondary | ICD-10-CM | POA: Diagnosis not present

## 2018-11-09 DIAGNOSIS — R600 Localized edema: Secondary | ICD-10-CM | POA: Diagnosis not present

## 2018-12-22 ENCOUNTER — Encounter: Payer: Self-pay | Admitting: Internal Medicine

## 2018-12-22 ENCOUNTER — Ambulatory Visit: Payer: Medicare PPO | Admitting: Internal Medicine

## 2018-12-22 VITALS — BP 142/64 | HR 69 | Resp 16 | Ht 59.0 in | Wt 167.0 lb

## 2018-12-22 DIAGNOSIS — N183 Chronic kidney disease, stage 3 unspecified: Secondary | ICD-10-CM

## 2018-12-22 DIAGNOSIS — M109 Gout, unspecified: Secondary | ICD-10-CM | POA: Diagnosis not present

## 2018-12-22 DIAGNOSIS — R911 Solitary pulmonary nodule: Secondary | ICD-10-CM | POA: Diagnosis not present

## 2018-12-22 DIAGNOSIS — R609 Edema, unspecified: Secondary | ICD-10-CM | POA: Diagnosis not present

## 2018-12-22 DIAGNOSIS — N184 Chronic kidney disease, stage 4 (severe): Secondary | ICD-10-CM | POA: Diagnosis not present

## 2018-12-22 DIAGNOSIS — I2583 Coronary atherosclerosis due to lipid rich plaque: Secondary | ICD-10-CM | POA: Diagnosis not present

## 2018-12-22 DIAGNOSIS — K219 Gastro-esophageal reflux disease without esophagitis: Secondary | ICD-10-CM

## 2018-12-22 DIAGNOSIS — I2510 Atherosclerotic heart disease of native coronary artery without angina pectoris: Secondary | ICD-10-CM | POA: Diagnosis not present

## 2018-12-22 DIAGNOSIS — R0602 Shortness of breath: Secondary | ICD-10-CM

## 2018-12-22 DIAGNOSIS — N2581 Secondary hyperparathyroidism of renal origin: Secondary | ICD-10-CM | POA: Diagnosis not present

## 2018-12-22 DIAGNOSIS — I129 Hypertensive chronic kidney disease with stage 1 through stage 4 chronic kidney disease, or unspecified chronic kidney disease: Secondary | ICD-10-CM | POA: Diagnosis not present

## 2018-12-22 NOTE — Patient Instructions (Signed)

## 2018-12-22 NOTE — Progress Notes (Signed)
[facility] [place], [place] [address]  Pulmonary Sleep Medicine   Office Visit Note  Patient Name: Lori Johnson DOB: [birth year] MRN [id number]  Date of Service: 12/22/2018  Complaints/HPI: SOB CKD GERD patient's been doing okay as far as her breathing is concerned.  She has a history of asthma and this is currently well controlled.  She also does have reflux disease and this does contribute to her breathing issues at times times.  Denies having any chest pain has some shortness of breath noted with exertion.  No wheezing is noted no current cough is noted either.  ROS  General: (-) fever, (-) chills, (-) night sweats, (-) weakness Skin: (-) rashes, (-) itching,. Eyes: (-) visual changes, (-) redness, (-) itching. Nose and Sinuses: (-) nasal stuffiness or itchiness, (-) postnasal drip, (-) nosebleeds, (-) sinus trouble. Mouth and Throat: (-) sore throat, (-) hoarseness. Neck: (-) swollen glands, (-) enlarged thyroid, (-) neck pain. Respiratory: - cough, (-) bloody sputum, + shortness of breath, - wheezing. Cardiovascular: - ankle swelling, (-) chest pain. Lymphatic: (-) lymph node enlargement. Neurologic: (-) numbness, (-) tingling. Psychiatric: (-) anxiety, (-) depression   Current Medication: Outpatient Encounter Medications as of 12/22/2018  Medication Sig  . Acetaminophen (TYLENOL ARTHRITIS PAIN PO) Take by mouth as needed.   [name] albuterol (PROVENTIL HFA;VENTOLIN HFA) 108 (90 Base) MCG/ACT inhaler TAKE 2 PUFFS BY MOUTH EVERY 6 HOURS AS NEEDED FOR WHEEZE OR SHORTNESS OF BREATH  . allopurinol (ZYLOPRIM) 100 MG tablet   . amLODipine (NORVASC) 10 MG tablet TAKE 1 TABLET EVERY DAY  . aspirin EC 81 MG tablet Take 81 mg by mouth daily.  . cephALEXin (KEFLEX) 500 MG capsule TAKE 1 CAPSULE EVERY DAY  . colchicine 0.6 MG tablet   . furosemide (LASIX) 40 MG tablet Take 40 mg every other day by mouth.   . hydrocortisone valerate cream (WESTCORT) 0.2 % Apply 1  application topically as needed.   [name] losartan-hydrochlorothiazide (HYZAAR) 50-12.5 MG tablet TAKE 1 TABLET EVERY DAY  . Multiple Vitamins-Minerals (ICAPS AREDS 2 PO) Take 1 tablet by mouth daily.  . pantoprazole (PROTONIX) 20 MG tablet Take 2 caps. Daily in am.  . pravastatin (PRAVACHOL) 20 MG tablet TAKE 1 TABLET AT BEDTIME   No facility-administered encounter medications on file as of 12/22/2018.     Surgical History: Past Surgical History:  Procedure Laterality Date  . ABDOMINAL HYSTERECTOMY  1981  . arterial bypass right leg Right   . broken left foot Left 01/2002  . CHOLECYSTECTOMY    . CORONARY ANGIOPLASTY WITH STENT PLACEMENT    . stent in leg      Medical History: Past Medical History:  Diagnosis Date  . Angiopathy, peripheral ([place])   . Arterial embolus and thrombosis ([place])   . ASCVD (arteriosclerotic cardiovascular disease)    with stent RCA  . Bilateral bunions   . Cancer ([place])    SCC removed off left leg  . Cervical lymphadenitis   . Chronic gout of left ankle due to renal impairment without tophus   . Chronic infection June 2014   Has chronic infection of right femoral graft with history of extensive debridement in June 2014. Was on oral Clindamycin for 3 weeks for culture positive for anerobes and staph lugdunensis. Vascular Surgeon at [facility] Remonia Richter, MD) recommended indefinite suppressive therapy with Cephalexin 500 mg qd and reassessment every 3 months. Will refill Cephalexin.)  . Chronic kidney disease   . COPD (chronic obstructive pulmonary  disease) ([place])   . CRF (chronic renal failure)   . GERD (gastroesophageal reflux disease)   . Hiatal hernia   . History of arterial bypass of lower extremity   . Hyperlipidemia   . Hypertension   . Hyperuricemia   . Macular degeneration   . Mass of right side of neck   . Nocturia   . OA (osteoarthritis)   . PVD (peripheral vascular disease) ([place])   . Renal disorder     Family History: Family History  Problem  Relation Age of Onset  . Heart disease Mother   . Heart disease Father     Social History: Social History   Socioeconomic History  . Marital status: Widowed    Spouse name: Not on file  . Number of children: 3  . Years of education: Not on file  . Highest education level: 9th grade  Occupational History  . Occupation: retired  Scientific laboratory technician  . Financial resource strain: Not hard at all  . Food insecurity:    Worry: Never true    Inability: Never true  . Transportation needs:    Medical: No    Non-medical: No  Tobacco Use  . Smoking status: Former Smoker    Types: Cigarettes  . Smokeless tobacco: Never Used  . Tobacco comment: quit >30-40 years ago  Substance and Sexual Activity  . Alcohol use: No  . Drug use: No  . Sexual activity: Not on file  Lifestyle  . Physical activity:    Days per week: Not on file    Minutes per session: Not on file  . Stress: Not at all  Relationships  . Social connections:    Talks on phone: Not on file    Gets together: Not on file    Attends religious service: Not on file    Active member of club or organization: Not on file    Attends meetings of clubs or organizations: Not on file    Relationship status: Not on file  . Intimate partner violence:    Fear of current or ex partner: Not on file    Emotionally abused: Not on file    Physically abused: Not on file    Forced sexual activity: Not on file  Other Topics Concern  . Not on file  Social History Narrative   Lives with daughter. Independent at baseline. Uses walker only for outside    Vital Signs: Blood pressure (!) 142/64, pulse 69, resp. rate 16, height 4\' 11"  (1.499 m), weight 167 lb (75.8 kg), SpO2 98 %.  Examination: General Appearance: The patient is well-developed, well-nourished, and in no distress. Skin: Gross inspection of skin unremarkable. Head: normocephalic, no gross deformities. Eyes: no gross deformities noted. ENT: ears appear grossly normal no  exudates. Neck: Supple. No thyromegaly. No LAD. Respiratory: few rhonchi noted at this time. Cardiovascular: Normal S1 and S2 without murmur or rub. Extremities: No cyanosis. pulses are equal. Neurologic: Alert and oriented. No involuntary movements.  LABS: No results found for this or any previous visit (from the past 2160 hour(s)).  Radiology: Dg Chest 2 View  Result Date: 05/19/2018 CLINICAL DATA:  Cough and congestion EXAM: CHEST - 2 VIEW COMPARISON:  October 19, 2017 FINDINGS: There is slight scarring in the right base. There is no edema or consolidation. Heart is upper normal in size with pulmonary vascularity normal. No adenopathy. There is aortic atherosclerosis. There is slight degenerative change in the thoracic spine. IMPRESSION: No edema or consolidation. Slight scarring  right base. Heart size upper normal. There is aortic atherosclerosis. Aortic Atherosclerosis (ICD10-I70.0). Electronically Signed   By: Lowella Grip III M.D.   On: 05/19/2018 10:44    No results found.  No results found.    Assessment and Plan: Patient Active Problem List   Diagnosis Date Noted  . GI bleed 10/16/2017  . Closed fracture of tibial plateau 06/24/2015  . Current tear knee, medial meniscus 06/24/2015  . Arthritis of knee, degenerative 06/24/2015  . Angiopathy, peripheral ([place]) 06/06/2015  . Arterial vascular disease 06/06/2015  . Bunion 06/06/2015  . Cervical adenitis 06/06/2015  . Gout due to renal impairment of multiple sites 06/06/2015  . Chronic infection 06/06/2015  . Contusion of forearm 06/06/2015  . Arm bruise 06/06/2015  . Asthma, cough variant 06/06/2015  . Gastroesophageal reflux disease with hiatal hernia 06/06/2015  . History of DVT of lower extremity 06/06/2015  . Bergmann's syndrome 06/06/2015  . H/O arterial bypass of lower limb 06/06/2015  . HLD (hyperlipidemia) 06/06/2015  . Benign hypertension 06/06/2015  . Elevated blood uric acid level 06/06/2015  .  Disorder of kidney 06/06/2015  . Lump in neck 06/06/2015  . Excessive urination at night 06/06/2015  . Arthritis, degenerative 06/06/2015  . Poor balance 06/06/2015  . Peripheral blood vessel disorder ([place]) 06/06/2015  . Acid reflux 03/08/2013  . Atherosclerosis of native artery of extremity ([place]) 01/22/2013  . Chronic kidney disease 01/22/2013  . CAD in native artery 01/22/2013  . Essential (primary) hypertension 01/22/2013  . Atherosclerosis of native arteries of extremity with rest pain ([place]) 01/22/2013  . Arthralgia of hip or thigh 03/29/2006    1. SOB we are going to get a 6-minute walk on her to assess her oxygen needs as well as getting pulmonary functions.  Continue with supportive care otherwise 2. CKD labs have been followed.  She is followed by primary care for for the chronic kidney disease.  Monitor renal function 3. Pulmonary Nodule has been stable we will continue to monitor 4. GERD continue with present management 5. CAD no active chest pain  General Counseling: I have discussed the findings of the evaluation and examination with Koralynn.  I have also discussed any further diagnostic evaluation thatmay be needed or ordered today. Ashiyah verbalizes understanding of the findings of todays visit. We also reviewed her medications today and discussed drug interactions and side effects including but not limited excessive drowsiness and altered mental states. We also discussed that there is always a risk not just to her but also people around her. she has been encouraged to call the office with any questions or concerns that should arise related to todays visit.    Time spent: 15 minutes  I have personally obtained a history, examined the patient, evaluated laboratory and imaging results, formulated the assessment and plan and placed orders.    Allyne Gee, MD [facility] Pulmonary and Critical Care Sleep medicine

## 2018-12-28 ENCOUNTER — Other Ambulatory Visit: Payer: Self-pay | Admitting: Family Medicine

## 2019-01-02 ENCOUNTER — Other Ambulatory Visit: Payer: Self-pay | Admitting: Family Medicine

## 2019-01-02 DIAGNOSIS — Z95828 Presence of other vascular implants and grafts: Secondary | ICD-10-CM

## 2019-01-02 DIAGNOSIS — B999 Unspecified infectious disease: Secondary | ICD-10-CM

## 2019-01-04 NOTE — Progress Notes (Signed)
Patient: Lori Johnson Female    DOB: 12-02-1929   85 y.o.   MRN: [id number] Visit Date: 01/05/2019  Today's Provider: [name], PA   Chief Complaint  Patient presents with  . Cough   Subjective:     Cough  This is a new problem. The current episode started 1 to 4 weeks ago (2 weeks). The cough is productive of sputum. Associated symptoms include myalgias, rhinorrhea, shortness of breath and wheezing. Pertinent negatives include no chest pain, chills, ear congestion, ear pain, fever, headaches, heartburn, hemoptysis, nasal congestion, postnasal drip, rash, sore throat, sweats or weight loss. Treatments tried: Mucinex. The treatment provided no relief. Her past medical history is significant for bronchiectasis and pneumonia.   Patient has had cough and shortness of breath for 2 weeks. Patient states her cough is productive. Patient also has symptoms of wheezing, runny nose, muscle soreness, and chest tightness. Patient has been taking otc Mucinex with no relief.    Allergies  Allergen Reactions  . Pletal [Cilostazol] Swelling  . Erythromycin Hives  . Codeine Nausea Only and Nausea And Vomiting  . Erythromycin Base Itching and Rash  . Penicillins Hives, Itching and Rash  . Sulfa Antibiotics Rash     Current Outpatient Medications:  .  Acetaminophen (TYLENOL ARTHRITIS PAIN PO), Take by mouth as needed. , Disp: , Rfl:  .  albuterol (PROVENTIL HFA;VENTOLIN HFA) 108 (90 Base) MCG/ACT inhaler, TAKE 2 PUFFS BY MOUTH EVERY 6 HOURS AS NEEDED FOR WHEEZE OR SHORTNESS OF BREATH, Disp: 18 Inhaler, Rfl: 0 .  allopurinol (ZYLOPRIM) 100 MG tablet, , Disp: , Rfl:  .  amLODipine (NORVASC) 10 MG tablet, TAKE 1 TABLET EVERY DAY, Disp: 90 tablet, Rfl: 3 .  aspirin EC 81 MG tablet, Take 81 mg by mouth daily., Disp: , Rfl:  .  cephALEXin (KEFLEX) 500 MG capsule, TAKE 1 CAPSULE EVERY DAY, Disp: 90 capsule, Rfl: 0 .  colchicine 0.6 MG tablet, , Disp: , Rfl:  .  furosemide (LASIX) 40 MG tablet,  Take 40 mg every other day by mouth. , Disp: , Rfl:  .  hydrocortisone valerate cream (WESTCORT) 0.2 %, Apply 1 application topically as needed. , Disp: , Rfl:  .  Loratadine (CLARITIN PO), Take by mouth., Disp: , Rfl:  .  losartan-hydrochlorothiazide (HYZAAR) 50-12.5 MG tablet, TAKE 1 TABLET EVERY DAY, Disp: 90 tablet, Rfl: 3 .  Multiple Vitamins-Minerals (ICAPS AREDS 2 PO), Take 1 tablet by mouth daily., Disp: , Rfl:  .  pantoprazole (PROTONIX) 20 MG tablet, Take 2 caps. Daily in am., Disp: 180 tablet, Rfl: 3 .  pravastatin (PRAVACHOL) 20 MG tablet, TAKE 1 TABLET AT BEDTIME, Disp: 90 tablet, Rfl: 3 .  VITAMIN D PO, Take by mouth., Disp: , Rfl:   Review of Systems  Constitutional: Negative for appetite change, chills, fatigue, fever and weight loss.  HENT: Positive for congestion and rhinorrhea. Negative for ear pain, postnasal drip, sinus pressure, sinus pain and sore throat.   Respiratory: Positive for cough, chest tightness, shortness of breath and wheezing. Negative for hemoptysis.   Cardiovascular: Negative for chest pain and palpitations.  Gastrointestinal: Negative for abdominal pain, heartburn, nausea and vomiting.  Musculoskeletal: Positive for myalgias.  Skin: Negative for rash.  Neurological: Negative for dizziness, weakness and headaches.   Social History   Tobacco Use  . Smoking status: Former Smoker    Types: Cigarettes  . Smokeless tobacco: Never Used  . Tobacco comment: quit >30-40 years ago  Substance Use Topics  . Alcohol use: No     Objective:   BP (!) 128/50 (BP Location: Right Arm, Patient Position: Sitting, Cuff Size: Large)   Pulse 63   Temp 98.1 F (36.7 C) (Oral)   Resp 16   Wt 174 lb (78.9 kg)   SpO2 96%   BMI 35.14 kg/m  Vitals:   01/05/19 0901  BP: (!) 128/50  Pulse: 63  Resp: 16  Temp: 98.1 F (36.7 C)  TempSrc: Oral  SpO2: 96%  Weight: 174 lb (78.9 kg)   Physical Exam Constitutional:      General: She is not in acute distress.     Appearance: She is well-developed.  HENT:     Head: Normocephalic and atraumatic.     Right Ear: Hearing normal.     Left Ear: Hearing normal.     Nose: Nose normal.  Eyes:     General: Lids are normal. No scleral icterus.       Right eye: No discharge.        Left eye: No discharge.     Conjunctiva/sclera: Conjunctivae normal.  Cardiovascular:     Rate and Rhythm: Normal rate and regular rhythm.     Heart sounds: Normal heart sounds.  Pulmonary:     Effort: Pulmonary effort is normal. No respiratory distress.     Breath sounds: Wheezing and rhonchi present.  Musculoskeletal: Normal range of motion.  Skin:    Findings: No lesion or rash.  Neurological:     Mental Status: She is alert and oriented to person, place, and time.  Psychiatric:        Speech: Speech normal.        Behavior: Behavior normal.        Thought Content: Thought content normal.       Assessment & Plan    1. Mild intermittent asthmatic bronchitis with acute exacerbation Onset with cough and congestion over the past 2 weeks. Some wheezing recently and started using the Ventolin-HFA qid prn. Slight wheeze with rough rhonchi in mid back region. No fever. Recommend Mucinex-DM and add Doxycycline. Increase fluid intake and use Ventolin as needed. Has an appointment with Dr. Raul Del (pulmonologist) in 2 weeks for pulmonary function tests.  - doxycycline (VIBRA-TABS) 100 MG tablet; Take 1 tablet (100 mg total) by mouth 2 (two) times daily.  Dispense: 20 tablet; Refill: [place], PA  [facility]

## 2019-01-05 ENCOUNTER — Encounter: Payer: Self-pay | Admitting: Family Medicine

## 2019-01-05 ENCOUNTER — Ambulatory Visit: Payer: Medicare PPO | Admitting: Family Medicine

## 2019-01-05 VITALS — BP 128/50 | HR 63 | Temp 98.1°F | Resp 16 | Wt 174.0 lb

## 2019-01-05 DIAGNOSIS — J4521 Mild intermittent asthma with (acute) exacerbation: Secondary | ICD-10-CM

## 2019-01-05 MED ORDER — DOXYCYCLINE HYCLATE 100 MG PO TABS: 100.0000 mg | ORAL_TABLET | Freq: Two times a day (BID) | ORAL | 0 refills | Status: DC

## 2019-01-11 ENCOUNTER — Telehealth: Payer: Self-pay

## 2019-01-11 NOTE — Telephone Encounter (Signed)
LMTCB to schedule AWV for this year. Last years AWV was 01/14/18. -MM

## 2019-01-23 NOTE — Telephone Encounter (Signed)
Pt's daughter called back for pt.  Please call her back regarding the AWV appt.  Thanks, [employer]

## 2019-01-26 ENCOUNTER — Encounter: Payer: Self-pay | Admitting: Family Medicine

## 2019-01-26 ENCOUNTER — Ambulatory Visit
Admission: RE | Admit: 2019-01-26 | Discharge: 2019-01-26 | Disposition: A | Discharge status: Home / Self Care | Payer: Medicare PPO | Source: Ambulatory Visit | Attending: Family Medicine | Admitting: Family Medicine

## 2019-01-26 ENCOUNTER — Ambulatory Visit: Payer: Medicare PPO | Admitting: Family Medicine

## 2019-01-26 ENCOUNTER — Ambulatory Visit
Admission: RE | Admit: 2019-01-26 | Discharge: 2019-01-26 | Disposition: A | Discharge status: Home / Self Care | Payer: Medicare PPO | Attending: Family Medicine | Admitting: Family Medicine

## 2019-01-26 VITALS — BP 118/38 | HR 64 | Temp 98.0°F | Wt 172.0 lb

## 2019-01-26 DIAGNOSIS — J4541 Moderate persistent asthma with (acute) exacerbation: Secondary | ICD-10-CM | POA: Insufficient documentation

## 2019-01-26 DIAGNOSIS — D649 Anemia, unspecified: Secondary | ICD-10-CM | POA: Diagnosis not present

## 2019-01-26 DIAGNOSIS — R05 Cough: Secondary | ICD-10-CM | POA: Diagnosis not present

## 2019-01-26 DIAGNOSIS — R0602 Shortness of breath: Secondary | ICD-10-CM | POA: Diagnosis not present

## 2019-01-26 DIAGNOSIS — N189 Chronic kidney disease, unspecified: Secondary | ICD-10-CM | POA: Diagnosis not present

## 2019-01-26 DIAGNOSIS — R079 Chest pain, unspecified: Secondary | ICD-10-CM | POA: Diagnosis not present

## 2019-01-26 MED ORDER — ALBUTEROL SULFATE HFA 108 (90 BASE) MCG/ACT IN AERS: INHALATION_SPRAY | RESPIRATORY_TRACT | 3 refills | Status: DC

## 2019-01-26 MED ORDER — LEVOFLOXACIN 500 MG PO TABS: 500.0000 mg | ORAL_TABLET | Freq: Every day | ORAL | 0 refills | Status: DC

## 2019-01-26 NOTE — Progress Notes (Signed)
Patient: Lori Johnson Female    DOB: Mar 13, 1930   85 y.o.   MRN: [id number] Visit Date: 01/26/2019  Today's Provider: [name], PA   Chief Complaint  Patient presents with  . Follow-up  . Cough   Subjective:     HPI   Mild intermittent asthmatic bronchitis with acute exacerbation From 01/05/2019-Recommended Mucinex-DM and added Doxycycline. Increase fluid intake and use Ventolin as needed. Has an appointment with Dr. Raul Del.  Patient finished antibiotic but is still having productive cough, shortness of breath, and wheezing.   Past Medical History:  Diagnosis Date  . Angiopathy, peripheral ([place])   . Arterial embolus and thrombosis ([place])   . ASCVD (arteriosclerotic cardiovascular disease)    with stent RCA  . Bilateral bunions   . Cancer ([place])    SCC removed off left leg  . Cervical lymphadenitis   . Chronic gout of left ankle due to renal impairment without tophus   . Chronic infection June 2014   Has chronic infection of right femoral graft with history of extensive debridement in June 2014. Was on oral Clindamycin for 3 weeks for culture positive for anerobes and staph lugdunensis. Vascular Surgeon at [facility] Remonia Richter, MD) recommended indefinite suppressive therapy with Cephalexin 500 mg qd and reassessment every 3 months. Will refill Cephalexin.)  . Chronic kidney disease   . COPD (chronic obstructive pulmonary disease) ([place])   . CRF (chronic renal failure)   . GERD (gastroesophageal reflux disease)   . Hiatal hernia   . History of arterial bypass of lower extremity   . Hyperlipidemia   . Hypertension   . Hyperuricemia   . Macular degeneration   . Mass of right side of neck   . Nocturia   . OA (osteoarthritis)   . PVD (peripheral vascular disease) ([place])   . Renal disorder    Past Surgical History:  Procedure Laterality Date  . ABDOMINAL HYSTERECTOMY  1981  . arterial bypass right leg Right   . broken left foot Left 01/2002  . CHOLECYSTECTOMY    .  CORONARY ANGIOPLASTY WITH STENT PLACEMENT    . stent in leg     Family History  Problem Relation Age of Onset  . Heart disease Mother   . Heart disease Father    Allergies  Allergen Reactions  . Pletal [Cilostazol] Swelling  . Erythromycin Hives  . Codeine Nausea Only and Nausea And Vomiting  . Erythromycin Base Itching and Rash  . Penicillins Hives, Itching and Rash  . Sulfa Antibiotics Rash    Current Outpatient Medications:  .  Acetaminophen (TYLENOL ARTHRITIS PAIN PO), Take by mouth as needed. , Disp: , Rfl:  .  albuterol (PROVENTIL HFA;VENTOLIN HFA) 108 (90 Base) MCG/ACT inhaler, TAKE 2 PUFFS BY MOUTH EVERY 6 HOURS AS NEEDED FOR WHEEZE OR SHORTNESS OF BREATH, Disp: 18 Inhaler, Rfl: 0 .  allopurinol (ZYLOPRIM) 100 MG tablet, , Disp: , Rfl:  .  amLODipine (NORVASC) 10 MG tablet, TAKE 1 TABLET EVERY DAY, Disp: 90 tablet, Rfl: 3 .  aspirin EC 81 MG tablet, Take 81 mg by mouth daily., Disp: , Rfl:  .  cephALEXin (KEFLEX) 500 MG capsule, TAKE 1 CAPSULE EVERY DAY, Disp: 90 capsule, Rfl: 0 .  colchicine 0.6 MG tablet, , Disp: , Rfl:  .  furosemide (LASIX) 40 MG tablet, Take 40 mg every other day by mouth. , Disp: , Rfl:  .  hydrocortisone valerate cream (WESTCORT) 0.2 %, Apply 1 application topically  as needed. , Disp: , Rfl:  .  Loratadine (CLARITIN PO), Take by mouth., Disp: , Rfl:  .  losartan-hydrochlorothiazide (HYZAAR) 50-12.5 MG tablet, TAKE 1 TABLET EVERY DAY, Disp: 90 tablet, Rfl: 3 .  Multiple Vitamins-Minerals (ICAPS AREDS 2 PO), Take 1 tablet by mouth daily., Disp: , Rfl:  .  pantoprazole (PROTONIX) 20 MG tablet, Take 2 caps. Daily in am., Disp: 180 tablet, Rfl: 3 .  pravastatin (PRAVACHOL) 20 MG tablet, TAKE 1 TABLET AT BEDTIME, Disp: 90 tablet, Rfl: 3 .  VITAMIN D PO, Take by mouth., Disp: , Rfl:  .  doxycycline (VIBRA-TABS) 100 MG tablet, Take 1 tablet (100 mg total) by mouth 2 (two) times daily. (Patient not taking: Reported on 01/26/2019), Disp: 20 tablet, Rfl: 0    Review of Systems  Constitutional: Negative for appetite change and fatigue.  HENT: Positive for congestion.   Respiratory: Positive for shortness of breath and wheezing. Negative for chest tightness.   Cardiovascular: Negative for palpitations.  Gastrointestinal: Negative for abdominal pain, nausea and vomiting.  Neurological: Negative for dizziness and weakness.   Social History   Tobacco Use  . Smoking status: Former Smoker    Types: Cigarettes  . Smokeless tobacco: Never Used  . Tobacco comment: quit >30-40 years ago  Substance Use Topics  . Alcohol use: No     Objective:   BP (!) 118/38 (BP Location: Right Arm, Patient Position: Sitting, Cuff Size: Large)   Pulse 64   Temp 98 F (36.7 C) (Oral)   Wt 172 lb (78 kg)   SpO2 91%   BMI 34.74 kg/m    BP Readings from Last 3 Encounters:  01/26/19 (!) 118/38  01/05/19 (!) 128/50  12/22/18 (!) 142/64   Wt Readings from Last 3 Encounters:  01/26/19 172 lb (78 kg)  01/05/19 174 lb (78.9 kg)  12/22/18 167 lb (75.8 kg)   Vitals:   01/26/19 0927  BP: (!) 118/38  Pulse: 64  Temp: 98 F (36.7 C)  TempSrc: Oral  SpO2: 91%  Weight: 172 lb (78 kg)   Physical Exam Constitutional:      General: She is not in acute distress.    Appearance: She is well-developed.  HENT:     Head: Normocephalic and atraumatic.     Right Ear: Hearing and tympanic membrane normal.     Left Ear: Hearing and tympanic membrane normal.     Nose: Nose normal.     Mouth/Throat:     Pharynx: Oropharynx is clear.  Eyes:     General: Lids are normal. No scleral icterus.       Right eye: No discharge.        Left eye: No discharge.     Conjunctiva/sclera: Conjunctivae normal.  Cardiovascular:     Rate and Rhythm: Normal rate and regular rhythm.     Heart sounds: Normal heart sounds.  Pulmonary:     Effort: Pulmonary effort is normal. No respiratory distress.     Breath sounds: Wheezing and rhonchi present.  Abdominal:     General: Bowel  sounds are normal.  Musculoskeletal:     Right lower leg: No edema.     Left lower leg: No edema.  Skin:    Findings: No lesion or rash.  Neurological:     Mental Status: She is alert and oriented to person, place, and time.  Psychiatric:        Speech: Speech normal.        Behavior: Behavior  normal.        Thought Content: Thought content normal.       Assessment & Plan    1. Moderate persistent asthmatic bronchitis with acute exacerbation Treated with Proventil-HFA and Doxycycline 01-05-19 for asthmatic bronchitis and was improved until the past few days when symptoms of cough and congestion returned with wheezing. Patient not sure all symptoms cleared before this recurrence. Will change to Levaquin 500 mg qd, refilled the Proventil-HFA prn, get labs and CXR evaluation to rule out pneumonia. Should keep appointment with Dr. Raul Del (pulmonologist) in the next couple weeks. Recheck pending test reports. - CBC with Differential/Platelet - Comprehensive metabolic panel - DG Chest 2 View - levofloxacin (LEVAQUIN) 500 MG tablet; Take 1 tablet (500 mg total) by mouth daily.  Dispense: 7 tablet; Refill: 0 - albuterol (PROVENTIL HFA;VENTOLIN HFA) 108 (90 Base) MCG/ACT inhaler; TAKE 2 PUFFS BY MOUTH EVERY 6 HOURS AS NEEDED FOR WHEEZE OR SHORTNESS OF BREATH  Dispense: 18 Inhaler; Refill: [place], [facility]

## 2019-01-27 LAB — CBC WITH DIFFERENTIAL/PLATELET
BASOS ABS: 0 10*3/uL (ref 0.0–0.2)
Basos: 1 %
EOS (ABSOLUTE): 0.7 10*3/uL — ABNORMAL HIGH (ref 0.0–0.4)
Eos: 11 %
Hematocrit: 29.5 % — ABNORMAL LOW (ref 34.0–46.6)
Hemoglobin: 9.7 g/dL — ABNORMAL LOW (ref 11.1–15.9)
IMMATURE GRANS (ABS): 0 10*3/uL (ref 0.0–0.1)
Immature Granulocytes: 0 %
Lymphocytes Absolute: 1.3 10*3/uL (ref 0.7–3.1)
Lymphs: 20 %
MCH: 31 pg (ref 26.6–33.0)
MCHC: 32.9 g/dL (ref 31.5–35.7)
MCV: 94 fL (ref 79–97)
Monocytes Absolute: 0.7 10*3/uL (ref 0.1–0.9)
Monocytes: 11 %
Neutrophils Absolute: 3.6 10*3/uL (ref 1.4–7.0)
Neutrophils: 57 %
Platelets: 145 10*3/uL — ABNORMAL LOW (ref 150–450)
RBC: 3.13 x10E6/uL — ABNORMAL LOW (ref 3.77–5.28)
RDW: 13.7 % (ref 11.7–15.4)
WBC: 6.2 10*3/uL (ref 3.4–10.8)

## 2019-01-27 LAB — COMPREHENSIVE METABOLIC PANEL
ALT: 8 IU/L (ref 0–32)
AST: 21 IU/L (ref 0–40)
Albumin/Globulin Ratio: 2 (ref 1.2–2.2)
Albumin: 4.2 g/dL (ref 3.6–4.6)
Alkaline Phosphatase: 75 IU/L (ref 39–117)
BUN/Creatinine Ratio: 18 (ref 12–28)
BUN: 35 mg/dL — ABNORMAL HIGH (ref 8–27)
Bilirubin Total: 0.6 mg/dL (ref 0.0–1.2)
CHLORIDE: 104 mmol/L (ref 96–106)
CO2: 22 mmol/L (ref 20–29)
Calcium: 10 mg/dL (ref 8.7–10.3)
Creatinine, Ser: 1.9 mg/dL — ABNORMAL HIGH (ref 0.57–1.00)
GFR calc Af Amer: 27 mL/min/{1.73_m2} — ABNORMAL LOW (ref >59)
GFR calc non Af Amer: 24 mL/min/{1.73_m2} — ABNORMAL LOW (ref >59)
GLUCOSE: 131 mg/dL — AB (ref 65–99)
Globulin, Total: 2.1 g/dL (ref 1.5–4.5)
Potassium: 4.7 mmol/L (ref 3.5–5.2)
Sodium: 141 mmol/L (ref 134–144)
Total Protein: 6.3 g/dL (ref 6.0–8.5)

## 2019-01-30 ENCOUNTER — Other Ambulatory Visit: Payer: Self-pay

## 2019-01-30 ENCOUNTER — Telehealth: Payer: Self-pay

## 2019-01-30 DIAGNOSIS — D649 Anemia, unspecified: Secondary | ICD-10-CM

## 2019-01-30 LAB — IFOBT (OCCULT BLOOD): IFOBT: NEGATIVE

## 2019-01-30 NOTE — Telephone Encounter (Signed)
Already scheduled for 02/10/19 @ 11 A by TP.  -MM

## 2019-01-30 NOTE — Telephone Encounter (Signed)
OC Light was dropped off for this pt.  It was negative.   Thanks,   -Mickel Baas

## 2019-01-30 NOTE — Telephone Encounter (Signed)
Normal iron levels. Should take multivitamin with minerals daily. OC-Light was negative for occult blood loss in stools. If having any heartburn, should take Pepcid 10-20 mg qd. Recheck CBC in 3-4 weeks.

## 2019-01-30 NOTE — Telephone Encounter (Signed)
Patient's daughter [name] was notified of results. Expressed understanding.

## 2019-02-02 LAB — IRON AND TIBC
Iron Saturation: 25 % (ref 15–55)
Iron: 62 ug/dL (ref 27–139)
Total Iron Binding Capacity: 251 ug/dL (ref 250–450)
UIBC: 189 ug/dL (ref 118–369)

## 2019-02-02 LAB — SPECIMEN STATUS REPORT

## 2019-02-08 ENCOUNTER — Ambulatory Visit: Payer: Self-pay | Admitting: Internal Medicine

## 2019-02-10 ENCOUNTER — Ambulatory Visit (INDEPENDENT_AMBULATORY_CARE_PROVIDER_SITE_OTHER): Payer: Medicare PPO

## 2019-02-10 ENCOUNTER — Other Ambulatory Visit: Payer: Self-pay

## 2019-02-10 VITALS — BP 152/52 | HR 76 | Temp 99.2°F | Ht 59.0 in | Wt 168.6 lb

## 2019-02-10 DIAGNOSIS — Z0000 Encounter for general adult medical examination without abnormal findings: Secondary | ICD-10-CM

## 2019-02-10 NOTE — Progress Notes (Addendum)
Subjective:   Lori Johnson is a 85 y.o. female who presents for Medicare Annual (Subsequent) preventive examination.  Review of Systems:  N/A  Cardiac Risk Factors include: advanced age (>64men, >33 women);hypertension;dyslipidemia;obesity (BMI >30kg/m2)     Objective:     Vitals: BP (!) 152/52 (BP Location: Right Arm)   Pulse 76   Temp 99.2 F (37.3 C) (Oral)   Ht 4\' 11"  (1.499 m)   Wt 168 lb 9.6 oz (76.5 kg)   BMI 34.05 kg/m   Body mass index is 34.05 kg/m.  Advanced Directives 02/10/2019 01/14/2018 10/16/2017 01/08/2017 03/31/2015  Does Patient Have a Medical Advance Directive? No No Yes No No  Type of Advance Directive - - Living will;Healthcare Power of Attorney - -  Does patient want to make changes to medical advance directive? - No - Patient declined No - Patient declined - -  Copy of [place] in Chart? - - No - copy requested - -  Would patient like information on creating a medical advance directive? No - Patient declined - - Yes (ED - Information included in AVS) No - patient declined information    Tobacco Social History   Tobacco Use  Smoking Status Former Smoker  . Types: Cigarettes  Smokeless Tobacco Never Used  Tobacco Comment   quit >30-40 years ago     Counseling given: Not Answered Comment: quit >30-40 years ago   Clinical Intake:     Pain : No/denies pain Pain Score: 0-No pain     Nutritional Status: BMI > 30  Obese Nutritional Risks: None Diabetes: No  How often do you need to have someone help you when you read instructions, pamphlets, or other written materials from your doctor or pharmacy?: 5 - Always(Due to MD.)  Interpreter Needed?: No  Information entered by :: [facility], LPN  Past Medical History:  Diagnosis Date  . Angiopathy, peripheral ([place])   . Arterial embolus and thrombosis ([place])   . ASCVD (arteriosclerotic cardiovascular disease)    with stent RCA  . Bilateral bunions   . Cancer ([place])    SCC  removed off left leg  . Cervical lymphadenitis   . Chronic gout of left ankle due to renal impairment without tophus   . Chronic infection June 2014   Has chronic infection of right femoral graft with history of extensive debridement in June 2014. Was on oral Clindamycin for 3 weeks for culture positive for anerobes and staph lugdunensis. Vascular Surgeon at [facility] Remonia Richter, MD) recommended indefinite suppressive therapy with Cephalexin 500 mg qd and reassessment every 3 months. Will refill Cephalexin.)  . Chronic kidney disease   . COPD (chronic obstructive pulmonary disease) ([place])   . CRF (chronic renal failure)   . GERD (gastroesophageal reflux disease)   . Hiatal hernia   . History of arterial bypass of lower extremity   . Hyperlipidemia   . Hypertension   . Hyperuricemia   . Macular degeneration   . Mass of right side of neck   . Nocturia   . OA (osteoarthritis)   . PVD (peripheral vascular disease) ([place])   . Renal disorder    Past Surgical History:  Procedure Laterality Date  . ABDOMINAL HYSTERECTOMY  1981  . arterial bypass right leg Right   . broken left foot Left 01/2002  . CHOLECYSTECTOMY    . CORONARY ANGIOPLASTY WITH STENT PLACEMENT    . stent in leg     Family History  Problem Relation Age  of Onset  . Heart disease Mother   . Heart disease Father    Social History   Socioeconomic History  . Marital status: Widowed    Spouse name: Not on file  . Number of children: 3  . Years of education: Not on file  . Highest education level: 9th grade  Occupational History  . Occupation: retired  Scientific laboratory technician  . Financial resource strain: Not hard at all  . Food insecurity:    Worry: Never true    Inability: Never true  . Transportation needs:    Medical: No    Non-medical: No  Tobacco Use  . Smoking status: Former Smoker    Types: Cigarettes  . Smokeless tobacco: Never Used  . Tobacco comment: quit >30-40 years ago  Substance and Sexual Activity  . Alcohol  use: No  . Drug use: No  . Sexual activity: Not on file  Lifestyle  . Physical activity:    Days per week: 0 days    Minutes per session: 0 min  . Stress: Not at all  Relationships  . Social connections:    Talks on phone: Patient refused    Gets together: Patient refused    Attends religious service: Patient refused    Active member of club or organization: Patient refused    Attends meetings of clubs or organizations: Patient refused    Relationship status: Patient refused  Other Topics Concern  . Not on file  Social History Narrative   Lives with daughter. Independent at baseline. Uses walker only for outside    Outpatient Encounter Medications as of 02/10/2019  Medication Sig  . Acetaminophen (TYLENOL ARTHRITIS PAIN PO) Take by mouth as needed.   [name] albuterol (PROVENTIL HFA;VENTOLIN HFA) 108 (90 Base) MCG/ACT inhaler TAKE 2 PUFFS BY MOUTH EVERY 6 HOURS AS NEEDED FOR WHEEZE OR SHORTNESS OF BREATH  . allopurinol (ZYLOPRIM) 100 MG tablet Take 100 mg by mouth 2 (two) times daily.   [name] amLODipine (NORVASC) 10 MG tablet TAKE 1 TABLET EVERY DAY  . aspirin EC 81 MG tablet Take 81 mg by mouth daily.  . cephALEXin (KEFLEX) 500 MG capsule TAKE 1 CAPSULE EVERY DAY  . colchicine 0.6 MG tablet Take 0.6 mg by mouth as needed.   . furosemide (LASIX) 40 MG tablet Take 40 mg every other day by mouth.   . hydrocortisone valerate cream (WESTCORT) 0.2 % Apply 1 application topically as needed.   . Loratadine (CLARITIN PO) Take by mouth.  . losartan-hydrochlorothiazide (HYZAAR) 50-12.5 MG tablet TAKE 1 TABLET EVERY DAY  . Multiple Vitamins-Minerals (ICAPS AREDS 2 PO) Take 1 tablet by mouth daily.  . pantoprazole (PROTONIX) 20 MG tablet Take 2 caps. Daily in am.  . pravastatin (PRAVACHOL) 20 MG tablet TAKE 1 TABLET AT BEDTIME  . VITAMIN D PO Take by mouth.  . levofloxacin (LEVAQUIN) 500 MG tablet Take 1 tablet (500 mg total) by mouth daily. (Patient not taking: Reported on 02/10/2019)   No  facility-administered encounter medications on file as of 02/10/2019.     Activities of Daily Living In your present state of health, do you have any difficulty performing the following activities: 02/10/2019  Hearing? Y  Comment Does not wear hearing aids.   Vision? Y  Comment Due to Macular Degeneration.   Difficulty concentrating or making decisions? N  Walking or climbing stairs? N  Dressing or bathing? N  Doing errands, shopping? Y  Comment Does not drive.   Preparing Food and eating ?  N  Using the Toilet? N  In the past six months, have you accidently leaked urine? Y  Comment Only has 1 kidney. Wears protection daily.  Do you have problems with loss of bowel control? N  Managing your Medications? Y  Comment Daughter assists.   Managing your Finances? Y  Comment Daughter writes out bills.   Housekeeping or managing your Housekeeping? N  Some recent data might be hidden    Patient Care Team: Chrismon, Vickki Muff, PA as PCP - General (Physician Assistant) Lorelee Cover., MD as Consulting Physician (Ophthalmology) Murlean Iba, MD as Consulting Physician (Internal Medicine) Dasher, Rayvon Char, MD as Consulting Physician (Dermatology) Lavera Guise, MD as Consulting Physician (Internal Medicine) Yolonda Kida, MD as Consulting Physician (Cardiology)    Assessment:   This is a routine wellness examination for Rileigh.  Exercise Activities and Dietary recommendations Current Exercise Habits: The patient does not participate in regular exercise at present, Exercise limited by: None identified  Goals    . DIET - EAT MORE FRUITS AND VEGETABLES     Recommend increasing amount of fruits in daily diet to 1-2 servings daily.     . Increase water intake     Starting 01/08/17, I will continue to drink 6 or more glasses of water a day.    . Prevent falls     Recommend to remove any items from the home that may cause slips or trips.       Fall Risk: Fall Risk  02/10/2019  01/14/2018 01/08/2017 02/28/2016  Falls in the past year? 1 No No No  Number falls in past yr: 0 - - -  Injury with Fall? 0 - - -  Follow up Falls prevention discussed - - -    FALL RISK PREVENTION PERTAINING TO THE HOME:  Any stairs in or around the home? Yes  If so, are there any without handrails? Yes   Home free of loose throw rugs in walkways, pet beds, electrical cords, etc? Yes  Adequate lighting in your home to reduce risk of falls? Yes   ASSISTIVE DEVICES UTILIZED TO PREVENT FALLS:  Life alert? No  Use of a cane, walker or w/c? Yes  Grab bars in the bathroom? Yes  Shower chair or bench in shower? No  Elevated toilet seat or a handicapped toilet? No   DME ORDERS:  DME order needed?  No   TIMED UP AND GO:  Was the test performed? No .    Depression Screen PHQ 2/9 Scores 02/10/2019 02/10/2019 01/14/2018 01/14/2018  PHQ - 2 Score 0 0 0 0  PHQ- 9 Score 3 - 5 -     Cognitive Function     6CIT Screen 02/10/2019 01/14/2018 01/08/2017  What Year? 0 points 0 points 0 points  What month? 0 points 0 points 0 points  What time? 0 points 0 points 0 points  Count back from 20 0 points 0 points 0 points  Months in reverse 2 points 4 points 2 points  Repeat phrase 0 points 6 points 8 points  Total Score 2 10 10     Immunization History  Administered Date(s) Administered  . Influenza Nasal 01/22/2013  . Influenza Split 09/13/2012  . Influenza, High Dose Seasonal PF 10/01/2014, 08/14/2016, 09/03/2017  . Influenza,inj,Quad PF,6+ Mos 12/06/2015  . Influenza-Unspecified 09/19/2018  . Pneumococcal Conjugate-13 11/13/2014  . Pneumococcal Polysaccharide-23 01/08/2017, 10/17/2017    Qualifies for Shingles Vaccine? Yes . Due for Shingrix. Education has been provided regarding  the importance of this vaccine. Pt has been advised to call insurance company to determine out of pocket expense. Advised may also receive vaccine at local pharmacy or Health Dept. Verbalized acceptance and  understanding.  Tdap: Although this vaccine is not a covered service during a Wellness Exam, does the patient still wish to receive this vaccine today?  No .  Education has been provided regarding the importance of this vaccine. Advised may receive this vaccine at local pharmacy or Health Dept. Aware to provide a copy of the vaccination record if obtained from local pharmacy or Health Dept. Verbalized acceptance and understanding.  Flu Vaccine: Up to date  Pneumococcal Vaccine: Up to date  Screening Tests Health Maintenance  Topic Date Due  . TETANUS/TDAP  06/08/2019 (Originally 03/16/1949)  . INFLUENZA VACCINE  Completed  . DEXA SCAN  Completed  . PNA vac Low Risk Adult  Completed    Cancer Screenings:  Colorectal Screening: No longer required.   Mammogram: No longer required.   Bone Density: Completed 11/17/15. Results reflect NORMAL.   Lung Cancer Screening: (Low Dose CT Chest recommended if Age 97-80 years, 30 pack-year currently smoking OR have quit w/in 15years.) does not qualify.   Additional Screening:  Vision Screening: Recommended annual ophthalmology exams for early detection of glaucoma and other disorders of the eye.  Dental Screening: Recommended annual dental exams for proper oral hygiene  Community Resource Referral:  CRR required this visit?  No       Plan:  I have personally reviewed and addressed the Medicare Annual Wellness questionnaire and have noted the following in the patient's chart:  A. Medical and social history B. Use of alcohol, tobacco or illicit drugs  C. Current medications and supplements D. Functional ability and status E.  Nutritional status F.  Physical activity G. Advance directives H. List of other physicians I.  Hospitalizations, surgeries, and ER visits in previous 12 months J.  [place] such as hearing and vision if needed, cognitive and depression L. Referrals and appointments   In addition, I have reviewed  and discussed with patient certain preventive protocols, quality metrics, and best practice recommendations. A written personalized care plan for preventive services as well as general preventive health recommendations were provided to patient. Nurse Health Advisor  Signed,    Kalep Full [place], [place]  [id number] Nurse Health Advisor   Nurse Notes: None.   Reviewed Nurse Health Advisor's documentation. Was available for consultation. Agree with note and schedule follow up.

## 2019-02-10 NOTE — Patient Instructions (Signed)
Lori Johnson , Thank you for taking time to come for your Medicare Wellness Visit. I appreciate your ongoing commitment to your health goals. Please review the following plan we discussed and let me know if I can assist you in the future.   Screening recommendations/referrals: Colonoscopy: No longer required.  Mammogram: No longer required.  Bone Density: No longer required.  Recommended yearly ophthalmology/optometry visit for glaucoma screening and checkup Recommended yearly dental visit for hygiene and checkup  Vaccinations: Influenza vaccine: Up to date Pneumococcal vaccine: Completed series Tdap vaccine: Pt declines today.  Shingles vaccine: Pt declines today.     Advanced directives: Advance directive discussed with you today. Even though you declined this today please call our office should you change your mind and we can give you the proper paperwork for you to fill out.  Conditions/risks identified: Fall risk prevention discussed today.   Next appointment: 02/20/19 @ 10 with Lori Johnson.   Preventive Care 29 Years and Older, Female Preventive care refers to lifestyle choices and visits with your health care provider that can promote health and wellness. What does preventive care include?  A yearly physical exam. This is also called an annual well check.  Dental exams once or twice a year.  Routine eye exams. Ask your health care provider how often you should have your eyes checked.  Personal lifestyle choices, including:  Daily care of your teeth and gums.  Regular physical activity.  Eating a healthy diet.  Avoiding tobacco and drug use.  Limiting alcohol use.  Practicing safe sex.  Taking low-dose aspirin every day.  Taking vitamin and mineral supplements as recommended by your health care provider. What happens during an annual well check? The services and screenings done by your health care provider during your annual well check will depend on your age,  overall health, lifestyle risk factors, and family history of disease. Counseling  Your health care provider may ask you questions about your:  Alcohol use.  Tobacco use.  Drug use.  Emotional well-being.  Home and relationship well-being.  Sexual activity.  Eating habits.  History of falls.  Memory and ability to understand (cognition).  Work and work Statistician.  Reproductive health. Screening  You may have the following tests or measurements:  Height, weight, and BMI.  Blood pressure.  Lipid and cholesterol levels. These may be checked every 5 years, or more frequently if you are over 7 years old.  Skin check.  Lung cancer screening. You may have this screening every year starting at age 25 if you have a 30-pack-year history of smoking and currently smoke or have quit within the past 15 years.  Fecal occult blood test (FOBT) of the stool. You may have this test every year starting at age 46.  Flexible sigmoidoscopy or colonoscopy. You may have a sigmoidoscopy every 5 years or a colonoscopy every 10 years starting at age 61.  Hepatitis C blood test.  Hepatitis B blood test.  Sexually transmitted disease (STD) testing.  Diabetes screening. This is done by checking your blood sugar (glucose) after you have not eaten for a while (fasting). You may have this done every 1-3 years.  Bone density scan. This is done to screen for osteoporosis. You may have this done starting at age 75.  Mammogram. This may be done every 1-2 years. Talk to your health care provider about how often you should have regular mammograms. Talk with your health care provider about your test results, treatment options, and if  necessary, the need for more tests. Vaccines  Your health care provider may recommend certain vaccines, such as:  Influenza vaccine. This is recommended every year.  Tetanus, diphtheria, and acellular pertussis (Tdap, Td) vaccine. You may need a Td booster every 10  years.  Zoster vaccine. You may need this after age 20.  Pneumococcal 13-valent conjugate (PCV13) vaccine. One dose is recommended after age 62.  Pneumococcal polysaccharide (PPSV23) vaccine. One dose is recommended after age 70. Talk to your health care provider about which screenings and vaccines you need and how often you need them. This information is not intended to replace advice given to you by your health care provider. Make sure you discuss any questions you have with your health care provider. Document Released: 12/13/2015 Document Revised: 08/05/2016 Document Reviewed: 09/17/2015 Elsevier Interactive Patient Education  2017 [place] Prevention in the Home Falls can cause injuries. They can happen to people of all ages. There are many things you can do to make your home safe and to help prevent falls. What can I do on the outside of my home?  Regularly fix the edges of walkways and driveways and fix any cracks.  Remove anything that might make you trip as you walk through a door, such as a raised step or threshold.  Trim any bushes or trees on the path to your home.  Use bright outdoor lighting.  Clear any walking paths of anything that might make someone trip, such as rocks or tools.  Regularly check to see if handrails are loose or broken. Make sure that both sides of any steps have handrails.  Any raised decks and porches should have guardrails on the edges.  Have any leaves, snow, or ice cleared regularly.  Use sand or salt on walking paths during winter.  Clean up any spills in your garage right away. This includes oil or grease spills. What can I do in the bathroom?  Use night lights.  Install grab bars by the toilet and in the tub and shower. Do not use towel bars as grab bars.  Use non-skid mats or decals in the tub or shower.  If you need to sit down in the shower, use a plastic, non-slip stool.  Keep the floor dry. Clean up any water that  spills on the floor as soon as it happens.  Remove soap buildup in the tub or shower regularly.  Attach bath mats securely with double-sided non-slip rug tape.  Do not have throw rugs and other things on the floor that can make you trip. What can I do in the bedroom?  Use night lights.  Make sure that you have a light by your bed that is easy to reach.  Do not use any sheets or blankets that are too big for your bed. They should not hang down onto the floor.  Have a firm chair that has side arms. You can use this for support while you get dressed.  Do not have throw rugs and other things on the floor that can make you trip. What can I do in the kitchen?  Clean up any spills right away.  Avoid walking on wet floors.  Keep items that you use a lot in easy-to-reach places.  If you need to reach something above you, use a strong step stool that has a grab bar.  Keep electrical cords out of the way.  Do not use floor polish or wax that makes floors slippery. If you must  use wax, use non-skid floor wax.  Do not have throw rugs and other things on the floor that can make you trip. What can I do with my stairs?  Do not leave any items on the stairs.  Make sure that there are handrails on both sides of the stairs and use them. Fix handrails that are broken or loose. Make sure that handrails are as long as the stairways.  Check any carpeting to make sure that it is firmly attached to the stairs. Fix any carpet that is loose or worn.  Avoid having throw rugs at the top or bottom of the stairs. If you do have throw rugs, attach them to the floor with carpet tape.  Make sure that you have a light switch at the top of the stairs and the bottom of the stairs. If you do not have them, ask someone to add them for you. What else can I do to help prevent falls?  Wear shoes that:  Do not have high heels.  Have rubber bottoms.  Are comfortable and fit you well.  Are closed at the  toe. Do not wear sandals.  If you use a stepladder:  Make sure that it is fully opened. Do not climb a closed stepladder.  Make sure that both sides of the stepladder are locked into place.  Ask someone to hold it for you, if possible.  Clearly mark and make sure that you can see:  Any grab bars or handrails.  First and last steps.  Where the edge of each step is.  Use tools that help you move around (mobility aids) if they are needed. These include:  Canes.  Walkers.  Scooters.  Crutches.  Turn on the lights when you go into a dark area. Replace any light bulbs as soon as they burn out.  Set up your furniture so you have a clear path. Avoid moving your furniture around.  If any of your floors are uneven, fix them.  If there are any pets around you, be aware of where they are.  Review your medicines with your doctor. Some medicines can make you feel dizzy. This can increase your chance of falling. Ask your doctor what other things that you can do to help prevent falls. This information is not intended to replace advice given to you by your health care provider. Make sure you discuss any questions you have with your health care provider. Document Released: 09/12/2009 Document Revised: 04/23/2016 Document Reviewed: 12/21/2014 Elsevier Interactive Patient Education  2017 [employer].

## 2019-02-11 ENCOUNTER — Other Ambulatory Visit: Payer: Self-pay | Admitting: Family Medicine

## 2019-02-16 DIAGNOSIS — N184 Chronic kidney disease, stage 4 (severe): Secondary | ICD-10-CM | POA: Diagnosis not present

## 2019-02-16 DIAGNOSIS — N2581 Secondary hyperparathyroidism of renal origin: Secondary | ICD-10-CM | POA: Diagnosis not present

## 2019-02-16 DIAGNOSIS — I129 Hypertensive chronic kidney disease with stage 1 through stage 4 chronic kidney disease, or unspecified chronic kidney disease: Secondary | ICD-10-CM | POA: Diagnosis not present

## 2019-02-16 DIAGNOSIS — R609 Edema, unspecified: Secondary | ICD-10-CM | POA: Diagnosis not present

## 2019-02-16 DIAGNOSIS — M109 Gout, unspecified: Secondary | ICD-10-CM | POA: Diagnosis not present

## 2019-02-20 ENCOUNTER — Ambulatory Visit: Payer: Medicare PPO | Admitting: Family Medicine

## 2019-02-21 DIAGNOSIS — N2581 Secondary hyperparathyroidism of renal origin: Secondary | ICD-10-CM | POA: Diagnosis not present

## 2019-02-21 DIAGNOSIS — I129 Hypertensive chronic kidney disease with stage 1 through stage 4 chronic kidney disease, or unspecified chronic kidney disease: Secondary | ICD-10-CM | POA: Diagnosis not present

## 2019-02-21 DIAGNOSIS — N184 Chronic kidney disease, stage 4 (severe): Secondary | ICD-10-CM | POA: Diagnosis not present

## 2019-02-21 DIAGNOSIS — R609 Edema, unspecified: Secondary | ICD-10-CM | POA: Diagnosis not present

## 2019-03-01 ENCOUNTER — Ambulatory Visit: Payer: Self-pay | Admitting: Internal Medicine

## 2019-03-07 ENCOUNTER — Other Ambulatory Visit: Payer: Self-pay | Admitting: Family Medicine

## 2019-03-07 DIAGNOSIS — Z95828 Presence of other vascular implants and grafts: Secondary | ICD-10-CM

## 2019-03-07 DIAGNOSIS — B999 Unspecified infectious disease: Secondary | ICD-10-CM

## 2019-04-04 ENCOUNTER — Other Ambulatory Visit: Payer: Self-pay | Admitting: Family Medicine

## 2019-04-04 ENCOUNTER — Telehealth: Payer: Self-pay

## 2019-04-04 DIAGNOSIS — K449 Diaphragmatic hernia without obstruction or gangrene: Secondary | ICD-10-CM

## 2019-04-04 DIAGNOSIS — K922 Gastrointestinal hemorrhage, unspecified: Secondary | ICD-10-CM

## 2019-04-04 DIAGNOSIS — D649 Anemia, unspecified: Secondary | ICD-10-CM

## 2019-04-04 DIAGNOSIS — K219 Gastro-esophageal reflux disease without esophagitis: Secondary | ICD-10-CM

## 2019-04-04 MED ORDER — PANTOPRAZOLE SODIUM 20 MG PO TBEC: DELAYED_RELEASE_TABLET | ORAL | 3 refills | Status: AC

## 2019-04-04 NOTE — Telephone Encounter (Signed)
Please review

## 2019-04-04 NOTE — Telephone Encounter (Signed)
Arville Go is calling to see if [name] can prescribed this medication for her mother.  pantoprazole (PROTONIX) 20 MG tablet   Reports that it was prescribed by Dr.Tahiliani and she is no longer her doctor. Reports that she spoke with [facility] and they said they where going to fax a paper for Velarde to sign. But if [name] has not received the paper if he can prescribe the medication. She also wants to make sure it is written fro 20mg  to take two caps daily in the morning so that patient doesn't run out.

## 2019-04-04 NOTE — Telephone Encounter (Signed)
Sent refill to [facility] with history of GI bleed with GERD and Hiatal hernia while on antiplatelet therapy secondary to past NSTEMI and vascular thrombosis. Anemia noted down to 10.4 on 02-21-19 per labs by Dr. Candiss Norse (nephrologist) following CKD.

## 2019-04-05 ENCOUNTER — Ambulatory Visit: Payer: Medicare PPO | Admitting: Internal Medicine

## 2019-04-05 ENCOUNTER — Other Ambulatory Visit: Payer: Self-pay

## 2019-04-05 ENCOUNTER — Ambulatory Visit: Payer: Medicare PPO

## 2019-04-05 DIAGNOSIS — R0602 Shortness of breath: Secondary | ICD-10-CM | POA: Diagnosis not present

## 2019-04-05 LAB — PULMONARY FUNCTION TEST

## 2019-04-12 NOTE — Procedures (Signed)
[place], [id number]  DATE OF SERVICE: Apr 05, 2019  Complete Pulmonary Function Testing Interpretation:  FINDINGS:  The forced vital capacity is mildly decreased.  The FEV1 is 1.26 L which is 71% of predicted and is mildly decreased.  FEV1 FVC ratio is mildly decreased.  Postbronchodilator there is no significant improvement in the FEV1 however clinical improvement may occur in the absence of spirometric improvement.  Total lung capacity is mildly decreased.  Residual volume is normal residual volume total lung capacity ratio is increased DLCO is severely decreased.  IMPRESSION:  This pulmonary function study is consistent with mild obstructive and mild restrictive lung disease.  There is no significant improvement in the FEV1 postbronchodilator however this does not preclude the use of bronchodilators.  DLCO was also severely reduced.  Clinical correlation is recommended  Allyne Gee, MD [facility] Pulmonary Critical Care Medicine Sleep Medicine

## 2019-04-13 ENCOUNTER — Telehealth: Payer: Self-pay | Admitting: *Deleted

## 2019-04-13 NOTE — Telephone Encounter (Signed)
No, the daughter was already upset because we didn't schedule the appt.

## 2019-04-13 NOTE — Telephone Encounter (Signed)
Patient's daughter [name] called concerning her mother having a cough an wheezing. [name] is concerned pt has bronchitis again. [name] would like an appt with [name]. No fever. Please advise?

## 2019-04-13 NOTE — Telephone Encounter (Signed)
Have you offered them a virtual visit

## 2019-04-13 NOTE — Telephone Encounter (Signed)
Per [name] the patient was advised that if she was having significant SOB of difficulty breathing she should seek attention at the ER or UC due to her high risk status.  The patient sounded well on the phone she did not sound short of breath or as if she was struggling to breath.  She said she is not having trouble all the time but does with and understands the current situation with COVID and the restrictions with certain things.  She was advised to use her inhaler up to every 4 hours if needed for cough, SOB or wheezing.  She said she had seen Dr Chancy Milroy 2 weeks ago and has follow up with him in the next few weeks.   She verbalized that she will seek medical attention if she worsens.

## 2019-05-05 ENCOUNTER — Other Ambulatory Visit: Payer: Self-pay | Admitting: Family Medicine

## 2019-05-05 DIAGNOSIS — J4541 Moderate persistent asthma with (acute) exacerbation: Secondary | ICD-10-CM

## 2019-05-08 ENCOUNTER — Encounter: Payer: Self-pay | Admitting: Internal Medicine

## 2019-05-08 ENCOUNTER — Encounter: Payer: Self-pay | Admitting: Emergency Medicine

## 2019-05-08 ENCOUNTER — Emergency Department: Payer: Medicare PPO

## 2019-05-08 ENCOUNTER — Inpatient Hospital Stay
Admission: EM | Admit: 2019-05-08 | Discharge: 2019-05-10 | DRG: 291 | Disposition: A | Discharge status: Home Health | Payer: Medicare PPO | Attending: Internal Medicine | Admitting: Internal Medicine

## 2019-05-08 ENCOUNTER — Ambulatory Visit (INDEPENDENT_AMBULATORY_CARE_PROVIDER_SITE_OTHER): Payer: Medicare PPO | Admitting: Internal Medicine

## 2019-05-08 ENCOUNTER — Other Ambulatory Visit: Payer: Self-pay

## 2019-05-08 VITALS — BP 136/82 | HR 57 | Resp 18 | Ht 59.0 in | Wt 175.0 lb

## 2019-05-08 DIAGNOSIS — Z20828 Contact with and (suspected) exposure to other viral communicable diseases: Secondary | ICD-10-CM | POA: Diagnosis present

## 2019-05-08 DIAGNOSIS — E785 Hyperlipidemia, unspecified: Secondary | ICD-10-CM | POA: Diagnosis present

## 2019-05-08 DIAGNOSIS — I509 Heart failure, unspecified: Secondary | ICD-10-CM | POA: Diagnosis not present

## 2019-05-08 DIAGNOSIS — R918 Other nonspecific abnormal finding of lung field: Secondary | ICD-10-CM | POA: Diagnosis not present

## 2019-05-08 DIAGNOSIS — J189 Pneumonia, unspecified organism: Secondary | ICD-10-CM | POA: Diagnosis not present

## 2019-05-08 DIAGNOSIS — Z7982 Long term (current) use of aspirin: Secondary | ICD-10-CM | POA: Diagnosis not present

## 2019-05-08 DIAGNOSIS — Z881 Allergy status to other antibiotic agents status: Secondary | ICD-10-CM

## 2019-05-08 DIAGNOSIS — R52 Pain, unspecified: Secondary | ICD-10-CM | POA: Diagnosis not present

## 2019-05-08 DIAGNOSIS — K449 Diaphragmatic hernia without obstruction or gangrene: Secondary | ICD-10-CM | POA: Diagnosis present

## 2019-05-08 DIAGNOSIS — I2510 Atherosclerotic heart disease of native coronary artery without angina pectoris: Secondary | ICD-10-CM | POA: Diagnosis present

## 2019-05-08 DIAGNOSIS — J9601 Acute respiratory failure with hypoxia: Secondary | ICD-10-CM | POA: Diagnosis not present

## 2019-05-08 DIAGNOSIS — I248 Other forms of acute ischemic heart disease: Secondary | ICD-10-CM | POA: Diagnosis not present

## 2019-05-08 DIAGNOSIS — Z882 Allergy status to sulfonamides status: Secondary | ICD-10-CM

## 2019-05-08 DIAGNOSIS — M1A39X0 Chronic gout due to renal impairment, multiple sites, without tophus (tophi): Secondary | ICD-10-CM | POA: Diagnosis present

## 2019-05-08 DIAGNOSIS — M1990 Unspecified osteoarthritis, unspecified site: Secondary | ICD-10-CM | POA: Diagnosis present

## 2019-05-08 DIAGNOSIS — R0602 Shortness of breath: Secondary | ICD-10-CM

## 2019-05-08 DIAGNOSIS — J9621 Acute and chronic respiratory failure with hypoxia: Secondary | ICD-10-CM | POA: Diagnosis present

## 2019-05-08 DIAGNOSIS — N184 Chronic kidney disease, stage 4 (severe): Secondary | ICD-10-CM | POA: Diagnosis present

## 2019-05-08 DIAGNOSIS — J440 Chronic obstructive pulmonary disease with acute lower respiratory infection: Secondary | ICD-10-CM | POA: Diagnosis not present

## 2019-05-08 DIAGNOSIS — Z86718 Personal history of other venous thrombosis and embolism: Secondary | ICD-10-CM

## 2019-05-08 DIAGNOSIS — K920 Hematemesis: Secondary | ICD-10-CM | POA: Diagnosis not present

## 2019-05-08 DIAGNOSIS — I352 Nonrheumatic aortic (valve) stenosis with insufficiency: Secondary | ICD-10-CM | POA: Diagnosis present

## 2019-05-08 DIAGNOSIS — J9600 Acute respiratory failure, unspecified whether with hypoxia or hypercapnia: Secondary | ICD-10-CM | POA: Diagnosis present

## 2019-05-08 DIAGNOSIS — R0902 Hypoxemia: Secondary | ICD-10-CM | POA: Diagnosis not present

## 2019-05-08 DIAGNOSIS — J810 Acute pulmonary edema: Secondary | ICD-10-CM

## 2019-05-08 DIAGNOSIS — I739 Peripheral vascular disease, unspecified: Secondary | ICD-10-CM | POA: Diagnosis present

## 2019-05-08 DIAGNOSIS — Z85828 Personal history of other malignant neoplasm of skin: Secondary | ICD-10-CM | POA: Diagnosis not present

## 2019-05-08 DIAGNOSIS — Z8249 Family history of ischemic heart disease and other diseases of the circulatory system: Secondary | ICD-10-CM | POA: Diagnosis not present

## 2019-05-08 DIAGNOSIS — Z87891 Personal history of nicotine dependence: Secondary | ICD-10-CM

## 2019-05-08 DIAGNOSIS — Z955 Presence of coronary angioplasty implant and graft: Secondary | ICD-10-CM

## 2019-05-08 DIAGNOSIS — I130 Hypertensive heart and chronic kidney disease with heart failure and stage 1 through stage 4 chronic kidney disease, or unspecified chronic kidney disease: Secondary | ICD-10-CM | POA: Diagnosis not present

## 2019-05-08 DIAGNOSIS — K219 Gastro-esophageal reflux disease without esophagitis: Secondary | ICD-10-CM

## 2019-05-08 DIAGNOSIS — R0603 Acute respiratory distress: Secondary | ICD-10-CM | POA: Diagnosis not present

## 2019-05-08 DIAGNOSIS — Z880 Allergy status to penicillin: Secondary | ICD-10-CM

## 2019-05-08 DIAGNOSIS — I5023 Acute on chronic systolic (congestive) heart failure: Secondary | ICD-10-CM | POA: Diagnosis not present

## 2019-05-08 DIAGNOSIS — R1084 Generalized abdominal pain: Secondary | ICD-10-CM | POA: Diagnosis not present

## 2019-05-08 DIAGNOSIS — R0689 Other abnormalities of breathing: Secondary | ICD-10-CM | POA: Diagnosis not present

## 2019-05-08 DIAGNOSIS — N179 Acute kidney failure, unspecified: Secondary | ICD-10-CM | POA: Diagnosis not present

## 2019-05-08 DIAGNOSIS — R109 Unspecified abdominal pain: Secondary | ICD-10-CM | POA: Diagnosis not present

## 2019-05-08 DIAGNOSIS — I5031 Acute diastolic (congestive) heart failure: Secondary | ICD-10-CM | POA: Diagnosis not present

## 2019-05-08 DIAGNOSIS — D631 Anemia in chronic kidney disease: Secondary | ICD-10-CM | POA: Diagnosis present

## 2019-05-08 DIAGNOSIS — I4510 Unspecified right bundle-branch block: Secondary | ICD-10-CM | POA: Diagnosis not present

## 2019-05-08 DIAGNOSIS — Z885 Allergy status to narcotic agent status: Secondary | ICD-10-CM

## 2019-05-08 LAB — COMPREHENSIVE METABOLIC PANEL
ALT: 15 U/L (ref 0–44)
AST: 27 U/L (ref 15–41)
Albumin: 4.4 g/dL (ref 3.5–5.0)
Alkaline Phosphatase: 65 U/L (ref 38–126)
Anion gap: 14 (ref 5–15)
BUN: 57 mg/dL — ABNORMAL HIGH (ref 8–23)
CO2: 21 mmol/L — ABNORMAL LOW (ref 22–32)
Calcium: 9.5 mg/dL (ref 8.9–10.3)
Chloride: 98 mmol/L (ref 98–111)
Creatinine, Ser: 2.43 mg/dL — ABNORMAL HIGH (ref 0.44–1.00)
GFR calc Af Amer: 20 mL/min — ABNORMAL LOW (ref >60)
GFR calc non Af Amer: 17 mL/min — ABNORMAL LOW (ref >60)
Glucose, Bld: 140 mg/dL — ABNORMAL HIGH (ref 70–99)
Potassium: 4.1 mmol/L (ref 3.5–5.1)
Sodium: 133 mmol/L — ABNORMAL LOW (ref 135–145)
Total Bilirubin: 1.2 mg/dL (ref 0.3–1.2)
Total Protein: 7.2 g/dL (ref 6.5–8.1)

## 2019-05-08 LAB — CBC WITH DIFFERENTIAL/PLATELET
Abs Immature Granulocytes: 0.03 10*3/uL (ref 0.00–0.07)
Basophils Absolute: 0 10*3/uL (ref 0.0–0.1)
Basophils Relative: 0 %
Eosinophils Absolute: 0.2 10*3/uL (ref 0.0–0.5)
Eosinophils Relative: 2 %
HCT: 30.1 % — ABNORMAL LOW (ref 36.0–46.0)
Hemoglobin: 9.9 g/dL — ABNORMAL LOW (ref 12.0–15.0)
Immature Granulocytes: 0 %
Lymphocytes Relative: 6 %
Lymphs Abs: 0.6 10*3/uL — ABNORMAL LOW (ref 0.7–4.0)
MCH: 30.7 pg (ref 26.0–34.0)
MCHC: 32.9 g/dL (ref 30.0–36.0)
MCV: 93.5 fL (ref 80.0–100.0)
Monocytes Absolute: 1 10*3/uL (ref 0.1–1.0)
Monocytes Relative: 10 %
Neutro Abs: 8.2 10*3/uL — ABNORMAL HIGH (ref 1.7–7.7)
Neutrophils Relative %: 82 %
Platelets: 167 10*3/uL (ref 150–400)
RBC: 3.22 MIL/uL — ABNORMAL LOW (ref 3.87–5.11)
RDW: 13.6 % (ref 11.5–15.5)
WBC: 10 10*3/uL (ref 4.0–10.5)
nRBC: 0 % (ref 0.0–0.2)

## 2019-05-08 LAB — URINALYSIS, COMPLETE (UACMP) WITH MICROSCOPIC
Bacteria, UA: NONE SEEN
Bilirubin Urine: NEGATIVE
Glucose, UA: NEGATIVE mg/dL
Ketones, ur: NEGATIVE mg/dL
Leukocytes,Ua: NEGATIVE
Nitrite: NEGATIVE
Protein, ur: NEGATIVE mg/dL
Specific Gravity, Urine: 1.009 (ref 1.005–1.030)
pH: 5 (ref 5.0–8.0)

## 2019-05-08 LAB — SARS CORONAVIRUS 2 BY RT PCR (HOSPITAL ORDER, PERFORMED IN ~~LOC~~ HOSPITAL LAB): SARS Coronavirus 2: NEGATIVE

## 2019-05-08 LAB — LACTIC ACID, PLASMA: Lactic Acid, Venous: 1.5 mmol/L (ref 0.5–1.9)

## 2019-05-08 LAB — TROPONIN I: Troponin I: 0.06 ng/mL (ref <0.03)

## 2019-05-08 LAB — PROCALCITONIN: Procalcitonin: 0.17 ng/mL

## 2019-05-08 LAB — BRAIN NATRIURETIC PEPTIDE: B Natriuretic Peptide: 1949 pg/mL — ABNORMAL HIGH (ref 0.0–100.0)

## 2019-05-08 MED ORDER — FUROSEMIDE 10 MG/ML IJ SOLN
20.0000 mg | Freq: Once | INTRAMUSCULAR | Status: AC
  Administered 2019-05-08: 22:00:00 20 mg via INTRAVENOUS
  Filled 2019-05-08: qty 4

## 2019-05-08 MED ORDER — SODIUM CHLORIDE 0.9 % IV SOLN
500.0000 mg | INTRAVENOUS | Status: DC
  Administered 2019-05-09: 500 mg via INTRAVENOUS
  Filled 2019-05-08 (×2): qty 500

## 2019-05-08 MED ORDER — SODIUM CHLORIDE 0.9 % IV SOLN
1.0000 g | INTRAVENOUS | Status: DC
  Administered 2019-05-09 (×2): 1 g via INTRAVENOUS
  Filled 2019-05-08: qty 1
  Filled 2019-05-08 (×2): qty 10

## 2019-05-08 MED ORDER — LEVOFLOXACIN IN D5W 750 MG/150ML IV SOLN
750.0000 mg | Freq: Once | INTRAVENOUS | Status: DC
  Administered 2019-05-08: 22:00:00 750 mg via INTRAVENOUS
  Filled 2019-05-08: qty 150

## 2019-05-08 NOTE — Patient Instructions (Signed)
Hypoxia Hypoxia is a condition that happens when there is a lack of oxygen in the body's tissues and organs. When there is not enough oxygen, organs cannot work as they should. This causes serious problems throughout the body and in the brain. What are the causes? This condition may be caused by:  Exposure to high altitude.  A collapsed lung (pneumothorax).  Lung infection (pneumonia).  Lung injury.  Long-term (chronic) lung disease, such as COPD (chronic obstructive pulmonary disease).  Blood collecting in the chest cavity (hemothorax).  Food, saliva, or vomit getting into the airway (aspiration).  Reduced blood flow (ischemia).  Severe blood loss.  Slow or shallow breathing (hypoventilation).  Blood disorders, such as anemia.  Carbon monoxide poisoning.  The heart suddenly stopping (cardiac arrest).  Anesthetic medicines.  Drowning.  Choking. What are the signs or symptoms? Symptoms of this condition include:  Headache.  Fatigue.  Drowsiness.  Forgetfulness.  Nausea.  Confusion.  Shortness of breath.  Dizziness.  Bluish color of the skin, lips, or nail beds (cyanosis).  Change in consciousness or awareness. If hypoxia is not treated, it can lead to convulsions, loss of consciousness (coma), or brain damage. How is this diagnosed? This condition may be diagnosed based on:  A physical exam.  Blood tests.  A test that measures how much oxygen is in your blood (pulse oximetry). This is done with a sensor that is placed on your finger, toe, or earlobe.  Chest X-ray.  Tests to check your lung function (pulmonary function tests).  A test to check the electrical activity of your heart (electrocardiogram, ECG). You may have other tests to determine the cause of your hypoxia. How is this treated?  Treatment for this condition depends on what is causing the hypoxia. You will likely be treated with oxygen therapy. This may be done by giving you oxygen  through a face mask or through tubes in your nose. Your health care provider may also recommend other therapies to treat the underlying cause of your hypoxia. Follow these instructions at home:  Take over-the-counter and prescription medicines only as told by your health care provider.  Do not use any products that contain nicotine or tobacco, such as cigarettes and e-cigarettes. If you need help quitting, ask your health care provider.  Avoid secondhand smoke.  Work with your health care provider to manage any chronic conditions you have that may be causing hypoxia, such as COPD.  Keep all follow-up visits as told by your health care provider. This is important. Contact a health care provider if:  You have a fever.  You have trouble breathing, even after treatment.  You become extremely short of breath when you exercise. Get help right away if:  Your shortness of breath gets worse, especially with normal or very little activity.  Your skin, lips, or nail beds have a bluish color.  You become confused or you cannot think properly.  You have chest pain. Summary  Hypoxia is a condition that happens when there is a lack of oxygen in the body's tissues and organs.  If hypoxia is not treated, it can lead to convulsions, loss of consciousness (coma), or brain damage.  Symptoms of hypoxia can include a headache, shortness of breath, confusion, nausea, and a bluish skin color.  Hypoxia has many possible causes, including exposure to high altitude, carbon monoxide poisoning, or other health issues, such as blood disorders or cardiac arrest.  Hypoxia is usually treated with oxygen therapy. This information is not   intended to replace advice given to you by your health care provider. Make sure you discuss any questions you have with your health care provider. Document Released: 01/04/2017 Document Revised: 01/04/2017 Document Reviewed: 01/04/2017 Elsevier Interactive Patient Education   2019 Elsevier Inc.  

## 2019-05-08 NOTE — Progress Notes (Addendum)
Pharmacy Antibiotic Note  Lori Johnson is a 85 y.o. female admitted on 05/08/2019 with pneumonia.  Pharmacy has been consulted for Azithromycin, Ceftriaxone dosing.  Plan: Pt received levaquin 750 mg IV X 1 in ED on 6/8.    Pt only has rash, hives to PCN, should be ok to continue with ceftriaxone.   Azithromycin 500 mg IV Q24H ordered to start on 6/9 @ 2200. Ceftriaxone 1 gm IV Q24H ordered to start on 6/9 @ 2200.      Temp (24hrs), Avg:99.6 F (37.6 C), Min:99.6 F (37.6 C), Max:99.6 F (37.6 C)  Recent Labs  Lab 05/08/19 1958  WBC 10.0  CREATININE 2.43*  LATICACIDVEN 1.5    Estimated Creatinine Clearance: 14.9 mL/min (A) (by C-G formula based on SCr of 2.43 mg/dL (H)).    Allergies  Allergen Reactions  . Pletal [Cilostazol] Swelling  . Erythromycin Hives  . Codeine Nausea Only and Nausea And Vomiting  . Erythromycin Base Itching and Rash  . Penicillins Hives, Itching and Rash  . Sulfa Antibiotics Rash    Antimicrobials this admission:   >>   >>   Dose adjustments this admission:   Microbiology results:  BCx:   UCx:    Sputum:    MRSA PCR:   Thank you for allowing pharmacy to be a part of this patient's care.  Lori Johnson D 05/08/2019 9:57 PM

## 2019-05-08 NOTE — H&P (Signed)
[facility] [place] NAME: Lori Johnson    MR#:  [id number]  DATE OF BIRTH:  February 19, 1930  DATE OF ADMISSION:  05/08/2019  PRIMARY CARE PHYSICIAN: Margo Common, PA   REQUESTING/REFERRING PHYSICIAN: Merlyn Lot, MD  CHIEF COMPLAINT:   Chief Complaint  Patient presents with  . Shortness of Breath    HISTORY OF PRESENT ILLNESS:  Lori Johnson  is a 85 y.o. female with a known history of COPD, CAD, peripheral vascular disease, chronic renal failure, hypertension, hyperlipidemia, aortic valve stenosis, and a history of GI bleed.  Patient had originally been seen by her pulmonologist, Dr. Chancy Milroy, in the a.m. of 05/08/2019 with increased shortness of breath.  She was ordered home oxygen during that visit.  Patient returned home according to her daughter and continued to experience shortness of breath with new onset generalized abdominal pain which began around 2 PM.  She describes the pain as sharp with a pain score 10 out of 10.  She originally thought pain was related to constipation.  However she had a normal bowel movement since the abdominal pain began.  She has been nauseated however no vomiting.  She denies fever however she has experienced chills.  She denies hematemesis, hematochezia, or melena.  She denies dysuria or dark urine.  She has a history of chronic renal failure followed by Dr. Candiss Norse.  BUN is 57 with creatinine 2.43 today.  Patient arrived to the emergency room via EMS services with oxygen desaturation into the 80s and was therefore placed on BiPAP therapy in the emergency room.  Chest x-ray demonstrated acute on chronic pulmonary interstitial opacity with concern for viral/atypical respiratory infection and pulmonary interstitial edema he received IV Lasix in the emergency room.  She has been started on IV antibiotics for possible pneumonia.  Additionally, patient has a history of GI bleed approximately 18 months ago.  Stool for occult  blood is pending still.  Troponin was 0.06 in the emergency room.  She has been admitted by the hospitalist service with transfer of care to stepdown for further management.  I have spoken with Hinton Dyer, CRNP.  PAST MEDICAL HISTORY:   Past Medical History:  Diagnosis Date  . Angiopathy, peripheral ([place])   . Arterial embolus and thrombosis ([place])   . ASCVD (arteriosclerotic cardiovascular disease)    with stent RCA  . Bilateral bunions   . Cancer ([employer])    SCC removed off left leg  . Cervical lymphadenitis   . Chronic gout of left ankle due to renal impairment without tophus   . Chronic infection June 2014   Has chronic infection of right femoral graft with history of extensive debridement in June 2014. Was on oral Clindamycin for 3 weeks for culture positive for anerobes and staph lugdunensis. Vascular Surgeon at [facility] Remonia Richter, MD) recommended indefinite suppressive therapy with Cephalexin 500 mg qd and reassessment every 3 months. Will refill Cephalexin.)  . Chronic kidney disease   . COPD (chronic obstructive pulmonary disease) ([place])   . CRF (chronic renal failure)   . GERD (gastroesophageal reflux disease)   . Hiatal hernia   . History of arterial bypass of lower extremity   . Hyperlipidemia   . Hypertension   . Hyperuricemia   . Macular degeneration   . Mass of right side of neck   . Nocturia   . OA (osteoarthritis)   . PVD (peripheral vascular disease) ([place])   . Renal disorder  PAST SURGICAL HISTORY:   Past Surgical History:  Procedure Laterality Date  . ABDOMINAL HYSTERECTOMY  1981  . arterial bypass right leg Right   . broken left foot Left 01/2002  . CHOLECYSTECTOMY    . CORONARY ANGIOPLASTY WITH STENT PLACEMENT    . stent in leg      SOCIAL HISTORY:   Social History   Tobacco Use  . Smoking status: Former Smoker    Types: Cigarettes  . Smokeless tobacco: Never Used  . Tobacco comment: quit >30-40 years ago  Substance Use Topics  . Alcohol use: No     FAMILY HISTORY:   Family History  Problem Relation Age of Onset  . Heart disease Mother   . Heart disease Father     DRUG ALLERGIES:   Allergies  Allergen Reactions  . Pletal [Cilostazol] Swelling  . Erythromycin Hives  . Codeine Nausea Only and Nausea And Vomiting  . Erythromycin Base Itching and Rash  . Penicillins Hives, Itching and Rash  . Sulfa Antibiotics Rash    REVIEW OF SYSTEMS:   Review of Systems  Constitutional: Positive for chills and fever. Negative for diaphoresis and malaise/fatigue.  HENT: Negative for congestion, sinus pain and sore throat.   Eyes: Negative for blurred vision, double vision and pain.  Respiratory: Positive for cough and shortness of breath. Negative for hemoptysis, sputum production and wheezing.   Cardiovascular: Positive for orthopnea and leg swelling (1+ pitting edema). Negative for chest pain and palpitations.  Gastrointestinal: Positive for abdominal pain and nausea. Negative for blood in stool, constipation, diarrhea, heartburn, melena and vomiting.  Genitourinary: Negative for dysuria, flank pain, hematuria and urgency.  Musculoskeletal: Negative for falls, joint pain and myalgias.  Skin: Negative.  Negative for itching and rash.  Neurological: Negative for dizziness, seizures, loss of consciousness, weakness and headaches.  Psychiatric/Behavioral: Negative.     MEDICATIONS AT HOME:   Prior to Admission medications   Medication Sig Start Date End Date Taking? Authorizing Provider  Acetaminophen (TYLENOL ARTHRITIS PAIN PO) Take by mouth as needed.    Yes [provider]  albuterol (VENTOLIN HFA) 108 (90 Base) MCG/ACT inhaler TAKE 2 PUFFS BY MOUTH EVERY 6 HOURS AS NEEDED FOR WHEEZE OR SHORTNESS OF BREATH 05/05/19  Yes Chrismon, Vickki Muff, PA  allopurinol (ZYLOPRIM) 100 MG tablet Take 100 mg by mouth 2 (two) times daily.  11/09/18  Yes [provider]  amLODipine (NORVASC) 10 MG tablet TAKE 1 TABLET EVERY DAY  12/29/18  Yes Chrismon, Vickki Muff, PA  aspirin EC 81 MG tablet Take 81 mg by mouth daily.   Yes [provider]  cephALEXin (KEFLEX) 500 MG capsule TAKE 1 CAPSULE EVERY DAY 03/09/19  Yes Chrismon, Vickki Muff, PA  colchicine 0.6 MG tablet Take 0.6 mg by mouth as needed.  12/14/18  Yes [provider]  furosemide (LASIX) 40 MG tablet Take 40 mg every other day by mouth.  04/02/15  Yes [provider]  hydrocortisone valerate cream (WESTCORT) 0.2 % Apply 1 application topically as needed.  08/26/16  Yes [provider]  Loratadine (CLARITIN PO) Take by mouth.   Yes [provider]  losartan-hydrochlorothiazide (HYZAAR) 50-12.5 MG tablet TAKE 1 TABLET EVERY DAY 02/13/19  Yes Chrismon, Vickki Muff, PA  Multiple Vitamins-Minerals (ICAPS AREDS 2 PO) Take 1 tablet by mouth daily.   Yes [provider]  pantoprazole (PROTONIX) 20 MG tablet Take 2 caps. Daily in am. 04/04/19  Yes Chrismon, Vickki Muff, PA  pravastatin (PRAVACHOL)  20 MG tablet TAKE 1 TABLET AT BEDTIME 02/13/19  Yes Chrismon, Vickki Muff, PA  VITAMIN D PO Take by mouth.   Yes [provider]      VITAL SIGNS:  Blood pressure (!) 145/52, pulse 64, temperature 99.6 F (37.6 C), temperature source Oral, resp. rate 20, SpO2 99 %.  PHYSICAL EXAMINATION:  Physical Exam Vitals signs and nursing note reviewed.  Constitutional:      General: She is in acute distress.     Appearance: She is ill-appearing.  HENT:     Head: Normocephalic.     Mouth/Throat:     Mouth: Mucous membranes are moist.     Pharynx: Oropharynx is clear.  Eyes:     Extraocular Movements: Extraocular movements intact.     Pupils: Pupils are equal, round, and reactive to light.  Neck:     Musculoskeletal: Normal range of motion and neck supple.     Vascular: No JVD.  Cardiovascular:     Rate and Rhythm: Normal rate and regular rhythm.     Pulses: Normal pulses.     Heart sounds: Normal heart sounds. No murmur. No friction  rub. No gallop.   Pulmonary:     Effort: Respiratory distress present.     Breath sounds: Examination of the right-lower field reveals decreased breath sounds. Examination of the left-lower field reveals decreased breath sounds. Decreased breath sounds present. No wheezing, rhonchi or rales.  Chest:     Chest wall: No tenderness or edema.  Abdominal:     General: Bowel sounds are normal.     Palpations: Abdomen is soft.     Tenderness: There is abdominal tenderness.  Musculoskeletal: Normal range of motion.     Right lower leg: She exhibits no tenderness. Edema (1+ pitting) present.     Left lower leg: She exhibits no tenderness. Edema (1+ pitting) present.  Skin:    General: Skin is warm and dry.     Capillary Refill: Capillary refill takes less than 2 seconds.     Findings: No erythema or rash.  Neurological:     General: No focal deficit present.     Mental Status: She is alert and oriented to person, place, and time.     Cranial Nerves: No cranial nerve deficit.  Psychiatric:        Mood and Affect: Mood normal.        Behavior: Behavior normal.     LABORATORY PANEL:   CBC Recent Labs  Lab 05/08/19 1958  WBC 10.0  HGB 9.9*  HCT 30.1*  PLT 167   ------------------------------------------------------------------------------------------------------------------  Chemistries  Recent Labs  Lab 05/08/19 1958  NA 133*  K 4.1  CL 98  CO2 21*  GLUCOSE 140*  BUN 57*  CREATININE 2.43*  CALCIUM 9.5  AST 27  ALT 15  ALKPHOS 65  BILITOT 1.2   ------------------------------------------------------------------------------------------------------------------  Cardiac Enzymes Recent Labs  Lab 05/08/19 1958  TROPONINI 0.06*   ------------------------------------------------------------------------------------------------------------------  RADIOLOGY:  Dg Chest Portable 1 View  Result Date: 05/08/2019 CLINICAL DATA:  85 year old female with increased shortness of  breath. Test for COVID-19 pending. EXAM: PORTABLE CHEST 1 VIEW COMPARISON:  01/26/2019 and earlier. FINDINGS: Portable AP upright view at 1946 hours. Acute on chronic bilateral pulmonary interstitial opacity. Lung volumes are lower. Stable cardiac size and mediastinal contours. Visualized tracheal air column is within normal limits. No pneumothorax, pleural effusion or consolidation. Paucity of bowel gas in the upper abdomen. No acute osseous abnormality identified. IMPRESSION: Lower  lung volumes with acute on chronic pulmonary interstitial opacity. Consider viral/atypical respiratory infection and pulmonary interstitial edema. Electronically Signed   By: Genevie Ann M.D.   On: 05/08/2019 20:07      IMPRESSION AND PLAN:   1.   Acute respiratory distress - Patient placed on BiPAP therapy and admitted to stepdown ICU for close monitoring and higher level of care --With evidence of acute exacerbation CHF in the setting of chronic kidney disease.  Lasix 20 mg IV twice daily with initial dose in the emergency room - Echocardiogram -Telemetry monitoring - Cardiology consulted for further evaluation and recommendations - Continue to trend troponin levels -CBC and BMP in the a.m.  2.  Acute exacerbation CHF - Lasix 20 mg IV twice daily - Echocardiogram - Telemetry monitoring -Cardiology consulted -Continue to trend troponin levels - We will monitor renal function closely during diuresis  3.  Community-acquired pneumonia - Treatment with IV Rocephin and azithromycin - Sputum culture - DuoNebs every 6 hours as needed for shortness of breath or wheezing - Patient is on BiPAP therapy for acute respiratory failure  4.  Abdominal pain with a history of GI bleed - We will get stool for occult blood - Will repeat CBC in the a.m. -We will treat with analgesic  5.  Hypertension - Patient's home medications have been restarted.  Will treat persistent hypertension expectantly - We will try monitoring   6.  Hyperlipidemia - Statin therapy resumed - Lipid panel pending  7. CKD - BUN 57 and creatinine 2.43 - This is followed by Dr. Candiss Norse outpatient.  We will continue to monitor renal function closely with repeated BMP in the a.m. and consider consulting Dr. Candiss Norse if renal function worsens.  DVT and PPI prophylaxis initiated  All the records are reviewed and case discussed with ED provider. The plan of care was discussed in details with the patient (and family). I answered all questions. The patient agreed to proceed with the above mentioned plan. Further management will depend upon hospital course.   CODE STATUS: Full code  TOTAL TIME TAKING CARE OF THIS PATIENT: 45 minutes.    Theo Dills Lodema Parma CRNPon 05/08/2019 at 11:47 PM  Pager - [phone]  After 6pm go to [email or web address] - Proofreader  [facility] [place] [facility]  Office  [phone]  CC: Primary care physician; Margo Common, PA   Note: This dictation was prepared with Dragon dictation along with smaller phrase technology. Any transcriptional errors that result from this process are unintentional.

## 2019-05-08 NOTE — ED Provider Notes (Signed)
[facility] Emergency Department Provider Note    First MD Initiated Contact with Patient 05/08/19 1942     (approximate)  I have reviewed the triage vital signs and the nursing notes.   HISTORY  Chief Complaint Shortness of Breath    HPI Lori Johnson is a 85 y.o. female extensive past medical history as listed below presents the ER for worsening shortness of breath.  Is also having some abdominal discomfort worsened by taking deep breaths.  Did have low-grade temperature.  Has had cough and exertional dyspnea.  Significant increased work of breathing noted over the past few days.  Patient unable to speak or provide much additional history due to significant dyspnea.    Past Medical History:  Diagnosis Date  . Angiopathy, peripheral ([place])   . Arterial embolus and thrombosis ([place])   . ASCVD (arteriosclerotic cardiovascular disease)    with stent RCA  . Bilateral bunions   . Cancer ([place])    SCC removed off left leg  . Cervical lymphadenitis   . Chronic gout of left ankle due to renal impairment without tophus   . Chronic infection June 2014   Has chronic infection of right femoral graft with history of extensive debridement in June 2014. Was on oral Clindamycin for 3 weeks for culture positive for anerobes and staph lugdunensis. Vascular Surgeon at [facility] Remonia Richter, MD) recommended indefinite suppressive therapy with Cephalexin 500 mg qd and reassessment every 3 months. Will refill Cephalexin.)  . Chronic kidney disease   . COPD (chronic obstructive pulmonary disease) ([place])   . CRF (chronic renal failure)   . GERD (gastroesophageal reflux disease)   . Hiatal hernia   . History of arterial bypass of lower extremity   . Hyperlipidemia   . Hypertension   . Hyperuricemia   . Macular degeneration   . Mass of right side of neck   . Nocturia   . OA (osteoarthritis)   . PVD (peripheral vascular disease) ([place])   . Renal disorder    Family History   Problem Relation Age of Onset  . Heart disease Mother   . Heart disease Father    Past Surgical History:  Procedure Laterality Date  . ABDOMINAL HYSTERECTOMY  1981  . arterial bypass right leg Right   . broken left foot Left 01/2002  . CHOLECYSTECTOMY    . CORONARY ANGIOPLASTY WITH STENT PLACEMENT    . stent in leg     Patient Active Problem List   Diagnosis Date Noted  . Acute respiratory failure ([place]) 05/08/2019  . GI bleed 10/16/2017  . Closed fracture of tibial plateau 06/24/2015  . Current tear knee, medial meniscus 06/24/2015  . Arthritis of knee, degenerative 06/24/2015  . Angiopathy, peripheral ([place]) 06/06/2015  . Arterial vascular disease 06/06/2015  . Bunion 06/06/2015  . Cervical adenitis 06/06/2015  . Gout due to renal impairment of multiple sites 06/06/2015  . Chronic infection 06/06/2015  . Contusion of forearm 06/06/2015  . Arm bruise 06/06/2015  . Asthma, cough variant 06/06/2015  . Gastroesophageal reflux disease with hiatal hernia 06/06/2015  . History of DVT of lower extremity 06/06/2015  . Bergmann's syndrome 06/06/2015  . H/O arterial bypass of lower limb 06/06/2015  . HLD (hyperlipidemia) 06/06/2015  . Benign hypertension 06/06/2015  . Elevated blood uric acid level 06/06/2015  . Disorder of kidney 06/06/2015  . Lump in neck 06/06/2015  . Excessive urination at night 06/06/2015  . Arthritis, degenerative 06/06/2015  . Poor balance 06/06/2015  .  Peripheral blood vessel disorder ([place]) 06/06/2015  . Acid reflux 03/08/2013  . Atherosclerosis of native artery of extremity ([place]) 01/22/2013  . Chronic kidney disease 01/22/2013  . CAD in native artery 01/22/2013  . Essential (primary) hypertension 01/22/2013  . Atherosclerosis of native arteries of extremity with rest pain ([place]) 01/22/2013  . Arthralgia of hip or thigh 03/29/2006      Prior to Admission medications   Medication Sig Start Date End Date Taking? Authorizing Provider  Acetaminophen  (TYLENOL ARTHRITIS PAIN PO) Take by mouth as needed.    Yes [provider]  albuterol (VENTOLIN HFA) 108 (90 Base) MCG/ACT inhaler TAKE 2 PUFFS BY MOUTH EVERY 6 HOURS AS NEEDED FOR WHEEZE OR SHORTNESS OF BREATH 05/05/19  Yes Chrismon, Vickki Muff, PA  allopurinol (ZYLOPRIM) 100 MG tablet Take 100 mg by mouth 2 (two) times daily.  11/09/18  Yes [provider]  amLODipine (NORVASC) 10 MG tablet TAKE 1 TABLET EVERY DAY 12/29/18  Yes Chrismon, Vickki Muff, PA  aspirin EC 81 MG tablet Take 81 mg by mouth daily.   Yes [provider]  cephALEXin (KEFLEX) 500 MG capsule TAKE 1 CAPSULE EVERY DAY 03/09/19  Yes Chrismon, Vickki Muff, PA  colchicine 0.6 MG tablet Take 0.6 mg by mouth as needed.  12/14/18  Yes [provider]  furosemide (LASIX) 40 MG tablet Take 40 mg every other day by mouth.  04/02/15  Yes [provider]  hydrocortisone valerate cream (WESTCORT) 0.2 % Apply 1 application topically as needed.  08/26/16  Yes [provider]  Loratadine (CLARITIN PO) Take by mouth.   Yes [provider]  losartan-hydrochlorothiazide (HYZAAR) 50-12.5 MG tablet TAKE 1 TABLET EVERY DAY 02/13/19  Yes Chrismon, Vickki Muff, PA  Multiple Vitamins-Minerals (ICAPS AREDS 2 PO) Take 1 tablet by mouth daily.   Yes [provider]  pantoprazole (PROTONIX) 20 MG tablet Take 2 caps. Daily in am. 04/04/19  Yes Chrismon, Vickki Muff, PA  pravastatin (PRAVACHOL) 20 MG tablet TAKE 1 TABLET AT BEDTIME 02/13/19  Yes Chrismon, Dennis E, PA  VITAMIN D PO Take by mouth.   Yes [provider]    Allergies Pletal [cilostazol]; Erythromycin; Codeine; Erythromycin base; Penicillins; and Sulfa antibiotics    Social History Social History   Tobacco Use  . Smoking status: Former Smoker    Types: Cigarettes  . Smokeless tobacco: Never Used  . Tobacco comment: quit >30-40 years ago  Substance Use Topics  . Alcohol use: No  . Drug use: No    Review of Systems Patient  denies headaches, rhinorrhea, blurry vision, numbness, shortness of breath, chest pain, edema, cough, abdominal pain, nausea, vomiting, diarrhea, dysuria, fevers, rashes or hallucinations unless otherwise stated above in HPI. ____________________________________________   PHYSICAL EXAM:  VITAL SIGNS: Vitals:   05/08/19 2230 05/08/19 2235  BP:  (!) 149/62  Pulse: 66   Resp: 20   Temp:    SpO2: 99%     Constitutional: Alert and oriented. In moderate resp distress  Eyes: Conjunctivae are normal.  Head: Atraumatic. Nose: No congestion/rhinnorhea. Mouth/Throat: Mucous membranes are moist.   Neck: No stridor. Painless ROM.  Cardiovascular: Normal rate, regular rhythm. Grossly normal heart sounds.  Good peripheral circulation. Respiratory: tachypnea with use of accessory muscles. Diminished bs bilateral lung fields Gastrointestinal: Soft and nontender in all four quadrants. No distention. No abdominal bruits. No CVA tenderness. Genitourinary: deferred  Musculoskeletal: No lower extremity tenderness. Bilateral edema.  No joint effusions. Neurologic:  Normal speech and  language. No gross focal neurologic deficits are appreciated. No facial droop Skin:  Skin is warm, dry and intact. No rash noted. Psychiatric: Mood and affect are normal. Speech and behavior are normal.  ____________________________________________   LABS (all labs ordered are listed, but only abnormal results are displayed)  Results for orders placed or performed during the hospital encounter of 05/08/19 (from the past 24 hour(s))  Lactic acid, plasma     Status: None   Collection Time: 05/08/19  7:58 PM  Result Value Ref Range   Lactic Acid, Venous 1.5 0.5 - 1.9 mmol/L  Comprehensive metabolic panel     Status: Abnormal   Collection Time: 05/08/19  7:58 PM  Result Value Ref Range   Sodium 133 (L) 135 - 145 mmol/L   Potassium 4.1 3.5 - 5.1 mmol/L   Chloride 98 98 - 111 mmol/L   CO2 21 (L) 22 - 32 mmol/L    Glucose, Bld 140 (H) 70 - 99 mg/dL   BUN 57 (H) 8 - 23 mg/dL   Creatinine, Ser 2.43 (H) 0.44 - 1.00 mg/dL   Calcium 9.5 8.9 - 10.3 mg/dL   Total Protein 7.2 6.5 - 8.1 g/dL   Albumin 4.4 3.5 - 5.0 g/dL   AST 27 15 - 41 U/L   ALT 15 0 - 44 U/L   Alkaline Phosphatase 65 38 - 126 U/L   Total Bilirubin 1.2 0.3 - 1.2 mg/dL   GFR calc non Af Amer 17 (L) >60 mL/min   GFR calc Af Amer 20 (L) >60 mL/min   Anion gap 14 5 - 15  CBC WITH DIFFERENTIAL     Status: Abnormal   Collection Time: 05/08/19  7:58 PM  Result Value Ref Range   WBC 10.0 4.0 - 10.5 K/uL   RBC 3.22 (L) 3.87 - 5.11 MIL/uL   Hemoglobin 9.9 (L) 12.0 - 15.0 g/dL   HCT 30.1 (L) 36.0 - 46.0 %   MCV 93.5 80.0 - 100.0 fL   MCH 30.7 26.0 - 34.0 pg   MCHC 32.9 30.0 - 36.0 g/dL   RDW 13.6 11.5 - 15.5 %   Platelets 167 150 - 400 K/uL   nRBC 0.0 0.0 - 0.2 %   Neutrophils Relative % 82 %   Neutro Abs 8.2 (H) 1.7 - 7.7 K/uL   Lymphocytes Relative 6 %   Lymphs Abs 0.6 (L) 0.7 - 4.0 K/uL   Monocytes Relative 10 %   Monocytes Absolute 1.0 0.1 - 1.0 K/uL   Eosinophils Relative 2 %   Eosinophils Absolute 0.2 0.0 - 0.5 K/uL   Basophils Relative 0 %   Basophils Absolute 0.0 0.0 - 0.1 K/uL   Immature Granulocytes 0 %   Abs Immature Granulocytes 0.03 0.00 - 0.07 K/uL  Procalcitonin     Status: None   Collection Time: 05/08/19  7:58 PM  Result Value Ref Range   Procalcitonin 0.17 ng/mL  Brain natriuretic peptide     Status: Abnormal   Collection Time: 05/08/19  7:58 PM  Result Value Ref Range   B Natriuretic Peptide 1,949.0 (H) 0.0 - 100.0 pg/mL  Urinalysis, Complete w Microscopic     Status: Abnormal   Collection Time: 05/08/19  7:58 PM  Result Value Ref Range   Color, Urine STRAW (A) YELLOW   APPearance CLEAR (A) CLEAR   Specific Gravity, Urine 1.009 1.005 - 1.030   pH 5.0 5.0 - 8.0   Glucose, UA NEGATIVE NEGATIVE mg/dL   Hgb urine dipstick  SMALL (A) NEGATIVE   Bilirubin Urine NEGATIVE NEGATIVE   Ketones, ur NEGATIVE NEGATIVE  mg/dL   Protein, ur NEGATIVE NEGATIVE mg/dL   Nitrite NEGATIVE NEGATIVE   Leukocytes,Ua NEGATIVE NEGATIVE   WBC, UA 0-5 0 - 5 WBC/hpf   Bacteria, UA NONE SEEN NONE SEEN   Squamous Epithelial / LPF 0-5 0 - 5  SARS Coronavirus 2 (CEPHEID- Performed in [place] hospital lab), Hosp Order     Status: None   Collection Time: 05/08/19  7:58 PM  Result Value Ref Range   SARS Coronavirus 2 NEGATIVE NEGATIVE  Troponin I -     Status: Abnormal   Collection Time: 05/08/19  7:58 PM  Result Value Ref Range   Troponin I 0.06 (HH) <0.03 ng/mL   ____________________________________________  EKG My review and personal interpretation at Time: 19:49   Indication: sob  Rate: 90  Rhythm: sinus Axis: normal Other: nonspecific st abn ____________________________________________  RADIOLOGY  I personally reviewed all radiographic images ordered to evaluate for the above acute complaints and reviewed radiology reports and findings.  These findings were personally discussed with the patient.  Please see medical record for radiology report.  ____________________________________________   PROCEDURES  Procedure(s) performed:  .Critical Care Performed by: Merlyn Lot, MD Authorized by: Merlyn Lot, MD   Critical care provider statement:    Critical care time (minutes):  30   Critical care time was exclusive of:  Separately billable procedures and treating other patients   Critical care was necessary to treat or prevent imminent or life-threatening deterioration of the following conditions:  Respiratory failure   Critical care was time spent personally by me on the following activities:  Development of treatment plan with patient or surrogate, discussions with consultants, evaluation of patient's response to treatment, examination of patient, obtaining history from patient or surrogate, ordering and performing treatments and interventions, ordering and review of laboratory studies, ordering  and review of radiographic studies, pulse oximetry, re-evaluation of patient's condition and review of old charts      Critical Care performed: yes ____________________________________________   INITIAL IMPRESSION / [place] [facility] / ED COURSE  Pertinent labs & imaging results that were available during my care of the patient were reviewed by me and considered in my medical decision making (see chart for details).   DDX: Asthma, copd, CHF, pna, ptx, malignancy, Pe, anemia   Lori Johnson is a 85 y.o. who presents to the ED with symptoms as described above.  Patient arrives in moderate respiratory distress requiring BiPAP.  Possible pneumonia CHF underlying chronic lung disease.  Blood will be sent for the by differential.  Clinical Course as of May 07 2322  Mon May 08, 2019  2151 Had lengthy discussion of goals of care with patient and family at bedside.  She does not want to be intubated but we will continue with BiPAP support.  Will give dose of Lasix as I am suspicious this is worsening edema.  Will also cover for community-acquired pneumonia.  COVID is negative.  Stable appropriate for admission to this facility.   [PR]    Clinical Course User Index [PR] Merlyn Lot, MD   The patient was evaluated in Emergency Department today for the symptoms described in the history of present illness. He/she was evaluated in the context of the global COVID-19 pandemic, which necessitated consideration that the patient might be at risk for infection with the SARS-CoV-2 virus that causes COVID-19. Institutional protocols and algorithms that pertain to the evaluation  of patients at risk for COVID-19 are in a state of rapid change based on information released by regulatory bodies including the CDC and federal and state organizations. These policies and algorithms were followed during the patient's care in the ED.   As part of my medical decision making, I reviewed the following data within  the [place] notes reviewed and incorporated, Labs reviewed, notes from prior ED visits and [place] Controlled Substance Database   ____________________________________________   FINAL CLINICAL IMPRESSION(S) / ED DIAGNOSES  Final diagnoses:  Shortness of breath  Respiratory distress  Acute pulmonary edema (HCC)      NEW MEDICATIONS STARTED DURING THIS VISIT:  New Prescriptions   No medications on file     Note:  This document was prepared using Dragon voice recognition software and may include unintentional dictation errors.    Merlyn Lot, MD 05/08/19 [phone]

## 2019-05-08 NOTE — ED Triage Notes (Signed)
Pt arrived via EMS from home where pt called out for increased SOB. Pt seen at pulmonologist today, put on O2 2L. Pt has hx/o CHF. Pt reports she has had increased SOB over last week with cough. Pt is 99.53F on arrival. Pt 97%, RA. Pt leaned over breathing at 28 RR. Pt reports she is having a hard time breathing. MD at bedside.

## 2019-05-08 NOTE — ED Notes (Signed)
Daughter screened and brought back to room due to pt partially blind, hearing impairment and mild dementia. Daughter reports pt called EMS due to abdominal pain. Pt has hx/o bleeding ulcers.

## 2019-05-08 NOTE — Consult Note (Signed)
Name: Lori Johnson MRN: [id number] DOB: [birth year]    ADMISSION DATE:  05/08/2019 CONSULTATION DATE: 05/08/2019  REFERRING MD : Dr. Sidney Ace  CHIEF COMPLAINT: Shortness of Breath   BRIEF PATIENT DESCRIPTION:  85 yo female admitted with acute respiratory failure secondary to pulmonary edema and possible pneumonia requiring Bipap   SIGNIFICANT EVENTS/STUDIES:  06/8-Pt admitted to the stepdown unit   HISTORY OF PRESENT ILLNESS:   This is an 85 yo female with a PMH of PVD, Osteoarthritis, Hyperuricemia, GI Bleed, Macular Degeneration, HTN, Hyperlipidemia, Hiatal Hernia, GERD, Chronic Kidney Disease, COPD, Cervical Lymphadenitis, and ASCVD. She presented to [facility] ER via EMS on 06/8 from home with c/o worsening cough and shortness of breath onset 1 week prior to presentation. She saw her outpatient pulmonologist on 06/8, and was prescribed home O2 @2L  via nasal canula.  She later developed sharp abdominal pain, daughter states pt had similar symptoms when she had a GI Bleed in the past prompting current ER visit.  Pt denied blood in stools or hematemesis.  Upon arrival to the ER pt noted to have increased work of breathing with an inability to speak due to dyspnea requiring Bipap.  Lab results revealed Na+ 133, CO2 21, BUN 57, creatinine 2.43, BNP 1,949, troponin 0.06, pct 0.17, hgb 9.9, and COVID-19 negative.  CXR concerning for viral/atypical respiratory infection and interstitial edema.  Therefore, pt received 20 mg iv lasix and iv abx initiated.  She was subsequently admitted to the stepdown unit by hospitalist team for additional workup and treatment.  PAST MEDICAL HISTORY :   has a past medical history of Angiopathy, peripheral ([place]), Arterial embolus and thrombosis ([place]), ASCVD (arteriosclerotic cardiovascular disease), Bilateral bunions, Cancer ([place]), Cervical lymphadenitis, Chronic gout of left ankle due to renal impairment without tophus, Chronic infection (June 2014), Chronic kidney disease, COPD  (chronic obstructive pulmonary disease) ([place]), CRF (chronic renal failure), GERD (gastroesophageal reflux disease), Hiatal hernia, History of arterial bypass of lower extremity, Hyperlipidemia, Hypertension, Hyperuricemia, Macular degeneration, Mass of right side of neck, Nocturia, OA (osteoarthritis), PVD (peripheral vascular disease) ([place]), and Renal disorder.  has a past surgical history that includes arterial bypass right leg (Right); Cholecystectomy; broken left foot (Left, 01/2002); stent in leg; Abdominal hysterectomy (1981); and Coronary angioplasty with stent. Prior to Admission medications   Medication Sig Start Date End Date Taking? Authorizing Provider  Acetaminophen (TYLENOL ARTHRITIS PAIN PO) Take by mouth as needed.    Yes [provider]  albuterol (VENTOLIN HFA) 108 (90 Base) MCG/ACT inhaler TAKE 2 PUFFS BY MOUTH EVERY 6 HOURS AS NEEDED FOR WHEEZE OR SHORTNESS OF BREATH 05/05/19  Yes Chrismon, Vickki Muff, PA  allopurinol (ZYLOPRIM) 100 MG tablet Take 100 mg by mouth 2 (two) times daily.  11/09/18  Yes [provider]  amLODipine (NORVASC) 10 MG tablet TAKE 1 TABLET EVERY DAY 12/29/18  Yes Chrismon, Vickki Muff, PA  aspirin EC 81 MG tablet Take 81 mg by mouth daily.   Yes [provider]  cephALEXin (KEFLEX) 500 MG capsule TAKE 1 CAPSULE EVERY DAY 03/09/19  Yes Chrismon, Vickki Muff, PA  colchicine 0.6 MG tablet Take 0.6 mg by mouth as needed.  12/14/18  Yes [provider]  furosemide (LASIX) 40 MG tablet Take 40 mg every other day by mouth.  04/02/15  Yes [provider]  hydrocortisone valerate cream (WESTCORT) 0.2 % Apply 1 application topically as needed.  08/26/16  Yes [provider]  Loratadine (CLARITIN PO) Take by mouth.   Yes [provider]  losartan-hydrochlorothiazide (HYZAAR) 50-12.5 MG tablet TAKE 1 TABLET EVERY DAY 02/13/19  Yes Chrismon, Vickki Muff, PA  Multiple Vitamins-Minerals (ICAPS AREDS 2 PO) Take 1 tablet by mouth  daily.   Yes [provider]  pantoprazole (PROTONIX) 20 MG tablet Take 2 caps. Daily in am. 04/04/19  Yes Chrismon, Vickki Muff, PA  pravastatin (PRAVACHOL) 20 MG tablet TAKE 1 TABLET AT BEDTIME 02/13/19  Yes Chrismon, Dennis E, PA  VITAMIN D PO Take by mouth.   Yes [provider]   Allergies  Allergen Reactions  . Pletal [Cilostazol] Swelling  . Erythromycin Hives  . Codeine Nausea Only and Nausea And Vomiting  . Erythromycin Base Itching and Rash  . Penicillins Hives, Itching and Rash  . Sulfa Antibiotics Rash    FAMILY HISTORY:  family history includes Heart disease in her father and mother. SOCIAL HISTORY:  reports that she has quit smoking. Her smoking use included cigarettes. She has never used smokeless tobacco. She reports that she does not drink alcohol or use drugs.  REVIEW OF SYSTEMS: Positives in BOLD  Constitutional: Negative for fever, chills, weight loss, malaise/fatigue and diaphoresis.  HENT: Negative for hearing loss, ear pain, nosebleeds, congestion, sore throat, neck pain, tinnitus and ear discharge.   Eyes: Negative for blurred vision, double vision, photophobia, pain, discharge and redness.  Respiratory: cough, hemoptysis, sputum production, shortness of breath, wheezing and stridor.   Cardiovascular: Negative for chest pain, palpitations, orthopnea, claudication, leg swelling and PND.  Gastrointestinal: heartburn, nausea, vomiting, abdominal pain, diarrhea, constipation, blood in stool and melena.  Genitourinary: Negative for dysuria, urgency, frequency, hematuria and flank pain.  Musculoskeletal: Negative for myalgias, back pain, joint pain and falls.  Skin: Negative for itching and rash.  Neurological: Negative for dizziness, tingling, tremors, sensory change, speech change, focal weakness, seizures, loss of consciousness, weakness and headaches.  Endo/Heme/Allergies: Negative for environmental allergies and polydipsia. Does not bruise/bleed  easily.  SUBJECTIVE:  States breathing has improved since placed on Bipap  VITAL SIGNS: Temp:  [99.6 F (37.6 C)] 99.6 F (37.6 C) (06/08 1947) Pulse Rate:  [57-87] 66 (06/08 2230) Resp:  [18-29] 20 (06/08 2230) BP: (136-161)/(56-82) 149/62 (06/08 2235) SpO2:  [96 %-99 %] 99 % (06/08 2230) FiO2 (%):  [99 %] 99 % (06/08 1034) Weight:  [79.4 kg] 79.4 kg (06/08 1007)  PHYSICAL EXAMINATION: General: well developed, well nourished female, NAD on Bipap  Neuro: alert and oriented, follows commands  HEENT: supple Cardiovascular: nsr, rrr, no R/G  Lungs: diminished with faint expiratory wheezes throughout, slightly labored  Abdomen: +BS x4, soft, obese, non distended  Musculoskeletal: 1+ bilateral lower extremity edema  Skin: intact no rashes or lesions present  Recent Labs  Lab 05/08/19 1958  NA 133*  K 4.1  CL 98  CO2 21*  BUN 57*  CREATININE 2.43*  GLUCOSE 140*   Recent Labs  Lab 05/08/19 1958  HGB 9.9*  HCT 30.1*  WBC 10.0  PLT 167   Dg Chest Portable 1 View  Result Date: 05/08/2019 CLINICAL DATA:  85 year old female with increased shortness of breath. Test for COVID-19 pending. EXAM: PORTABLE CHEST 1 VIEW COMPARISON:  01/26/2019 and earlier. FINDINGS: Portable AP upright view at 1946 hours. Acute on chronic bilateral pulmonary interstitial opacity. Lung volumes are lower. Stable cardiac size and mediastinal contours. Visualized tracheal air column is within normal limits. No pneumothorax, pleural effusion or consolidation. Paucity of bowel gas in the upper abdomen. No acute osseous abnormality identified. IMPRESSION: Lower lung  volumes with acute on chronic pulmonary interstitial opacity. Consider viral/atypical respiratory infection and pulmonary interstitial edema. Electronically Signed   By: Genevie Ann M.D.   On: 05/08/2019 20:07    ASSESSMENT / PLAN:  Acute respiratory failure secondary to pulmonary edema and possible pneumonia  Hx: COPD Supplemental O2 for dyspnea  and/or hypoxia  Prn and scheduled bronchodilator therapy  Trend WBC and monitor fever curve  Trend PCT Follow cultures  Continue ceftiaxone and azithromycin for now  Continue iv lasix   Hypertension  Elevated troponin likely secondary to demand ischemia in setting of respiratory failure  Hx: Hyperlipidemia and PVD Continuous telemetry monitoring  Continue outpatient cardiac medications  Echo pending  Trend troponin's  Acute on chronic renal failure  Trend BMP Replace electrolytes as indicated  Monitor UOP Avoid nephrotoxic medications  Anemia without obvious acute blood loss VTE px: subq heparin Trend CBC  Monitor for s/sx of bleeding and transfuse for hgb <7  Abdominal pain  GERD Continue protonix  Marda Stalker, Junior Pager [phone] (please enter 7 digits) PCCM Consult Pager [phone] (please enter 7 digits)

## 2019-05-08 NOTE — ED Notes (Signed)
ED TO INPATIENT HANDOFF REPORT  ED Nurse Name and Phone #: Saidee Geremia [phone]  S Name/Age/Gender Lori Johnson 85 y.o. female Room/Bed: ED11A/ED11A  Code Status   Code Status: Prior  Home/SNF/Other Home Patient oriented to: self, place, time and situation Is this baseline? Yes   Triage Complete: Triage complete  Chief Complaint [facility] Shob Cough Poss Covic  Triage Note Pt arrived via EMS from home where pt called out for increased SOB. Pt seen at pulmonologist today, put on O2 2L. Pt has hx/o CHF. Pt reports she has had increased SOB over last week with cough. Pt is 99.15F on arrival. Pt 97%, RA. Pt leaned over breathing at 28 RR. Pt reports she is having a hard time breathing. MD at bedside.    Allergies Allergies  Allergen Reactions  . Pletal [Cilostazol] Swelling  . Erythromycin Hives  . Codeine Nausea Only and Nausea And Vomiting  . Erythromycin Base Itching and Rash  . Penicillins Hives, Itching and Rash  . Sulfa Antibiotics Rash    Level of Care/Admitting Diagnosis ED Disposition    ED Disposition Condition [facility]: [place] [[id number]]  Level of Care: Stepdown [14]  Covid Evaluation: Confirmed COVID Negative  Diagnosis: Acute respiratory failure ([place]) [518.81.ICD-9-CM]  Admitting Physician: Christel Mormon [[id number]]  Attending Physician: Christel Mormon [[id number]]  Estimated length of stay: 3 - 4 days  Certification:: I certify this patient will need inpatient services for at least 2 midnights  PT Class (Do Not Modify): Inpatient [101]  PT Acc Code (Do Not Modify): Private [1]       B Medical/Surgery History Past Medical History:  Diagnosis Date  . Angiopathy, peripheral ([place])   . Arterial embolus and thrombosis ([place])   . ASCVD (arteriosclerotic cardiovascular disease)    with stent RCA  . Bilateral bunions   . Cancer ([place])    SCC removed off left leg  . Cervical lymphadenitis   . Chronic gout of left ankle due to  renal impairment without tophus   . Chronic infection June 2014   Has chronic infection of right femoral graft with history of extensive debridement in June 2014. Was on oral Clindamycin for 3 weeks for culture positive for anerobes and staph lugdunensis. Vascular Surgeon at [facility] Remonia Richter, MD) recommended indefinite suppressive therapy with Cephalexin 500 mg qd and reassessment every 3 months. Will refill Cephalexin.)  . Chronic kidney disease   . COPD (chronic obstructive pulmonary disease) ([place])   . CRF (chronic renal failure)   . GERD (gastroesophageal reflux disease)   . Hiatal hernia   . History of arterial bypass of lower extremity   . Hyperlipidemia   . Hypertension   . Hyperuricemia   . Macular degeneration   . Mass of right side of neck   . Nocturia   . OA (osteoarthritis)   . PVD (peripheral vascular disease) ([place])   . Renal disorder    Past Surgical History:  Procedure Laterality Date  . ABDOMINAL HYSTERECTOMY  1981  . arterial bypass right leg Right   . broken left foot Left 01/2002  . CHOLECYSTECTOMY    . CORONARY ANGIOPLASTY WITH STENT PLACEMENT    . stent in leg       A IV Location/Drains/Wounds Patient Lines/Drains/Airways Status   Active Line/Drains/Airways    Name:   Placement date:   Placement time:   Site:   Days:   Peripheral IV 05/08/19 Left Hand  05/08/19    2011    Hand   less than 1   Peripheral IV 05/08/19 Right Forearm   05/08/19    2011    Forearm   less than 1          Intake/Output Last 24 hours No intake or output data in the 24 hours ending 05/08/19 2203  Labs/Imaging Results for orders placed or performed during the hospital encounter of 05/08/19 (from the past 48 hour(s))  Lactic acid, plasma     Status: None   Collection Time: 05/08/19  7:58 PM  Result Value Ref Range   Lactic Acid, Venous 1.5 0.5 - 1.9 mmol/L    Comment: Performed at [facility], [address] [place], [place] [address]  Comprehensive metabolic  panel     Status: Abnormal   Collection Time: 05/08/19  7:58 PM  Result Value Ref Range   Sodium 133 (L) 135 - 145 mmol/L   Potassium 4.1 3.5 - 5.1 mmol/L   Chloride 98 98 - 111 mmol/L   CO2 21 (L) 22 - 32 mmol/L   Glucose, Bld 140 (H) 70 - 99 mg/dL   BUN 57 (H) 8 - 23 mg/dL   Creatinine, Ser 2.43 (H) 0.44 - 1.00 mg/dL   Calcium 9.5 8.9 - 10.3 mg/dL   Total Protein 7.2 6.5 - 8.1 g/dL   Albumin 4.4 3.5 - 5.0 g/dL   AST 27 15 - 41 U/L   ALT 15 0 - 44 U/L   Alkaline Phosphatase 65 38 - 126 U/L   Total Bilirubin 1.2 0.3 - 1.2 mg/dL   GFR calc non Af Amer 17 (L) >60 mL/min   GFR calc Af Amer 20 (L) >60 mL/min   Anion gap 14 5 - 15    Comment: Performed at [facility], [address] [place], [place] [address]  CBC WITH DIFFERENTIAL     Status: Abnormal   Collection Time: 05/08/19  7:58 PM  Result Value Ref Range   WBC 10.0 4.0 - 10.5 K/uL   RBC 3.22 (L) 3.87 - 5.11 MIL/uL   Hemoglobin 9.9 (L) 12.0 - 15.0 g/dL   HCT 30.1 (L) 36.0 - 46.0 %   MCV 93.5 80.0 - 100.0 fL   MCH 30.7 26.0 - 34.0 pg   MCHC 32.9 30.0 - 36.0 g/dL   RDW 13.6 11.5 - 15.5 %   Platelets 167 150 - 400 K/uL   nRBC 0.0 0.0 - 0.2 %   Neutrophils Relative % 82 %   Neutro Abs 8.2 (H) 1.7 - 7.7 K/uL   Lymphocytes Relative 6 %   Lymphs Abs 0.6 (L) 0.7 - 4.0 K/uL   Monocytes Relative 10 %   Monocytes Absolute 1.0 0.1 - 1.0 K/uL   Eosinophils Relative 2 %   Eosinophils Absolute 0.2 0.0 - 0.5 K/uL   Basophils Relative 0 %   Basophils Absolute 0.0 0.0 - 0.1 K/uL   Immature Granulocytes 0 %   Abs Immature Granulocytes 0.03 0.00 - 0.07 K/uL    Comment: Performed at [facility], [address] [place], [place] [address]  Procalcitonin     Status: None   Collection Time: 05/08/19  7:58 PM  Result Value Ref Range   Procalcitonin 0.17 ng/mL    Comment:        Interpretation: PCT (Procalcitonin) <= 0.5 ng/mL: Systemic infection (sepsis) is not likely. Local bacterial infection is  possible. (NOTE)       Sepsis  PCT Algorithm           Lower Respiratory Tract                                      Infection PCT Algorithm    ----------------------------     ----------------------------         PCT < 0.25 ng/mL                PCT < 0.10 ng/mL         Strongly encourage             Strongly discourage   discontinuation of antibiotics    initiation of antibiotics    ----------------------------     -----------------------------       PCT 0.25 - 0.50 ng/mL            PCT 0.10 - 0.25 ng/mL               OR       >80% decrease in PCT            Discourage initiation of                                            antibiotics      Encourage discontinuation           of antibiotics    ----------------------------     -----------------------------         PCT >= 0.50 ng/mL              PCT 0.26 - 0.50 ng/mL               AND        <80% decrease in PCT             Encourage initiation of                                             antibiotics       Encourage continuation           of antibiotics    ----------------------------     -----------------------------        PCT >= 0.50 ng/mL                  PCT > 0.50 ng/mL               AND         increase in PCT                  Strongly encourage                                      initiation of antibiotics    Strongly encourage escalation           of antibiotics                                     -----------------------------  PCT <= 0.25 ng/mL                                                 OR                                        > 80% decrease in PCT                                     Discontinue / Do not initiate                                             antibiotics Performed at [facility], [address] [place], [place] [address]   Brain natriuretic peptide     Status: Abnormal   Collection Time: 05/08/19  7:58 PM  Result Value Ref Range   B  Natriuretic Peptide 1,949.0 (H) 0.0 - 100.0 pg/mL    Comment: Performed at [facility], [address], [place], [place] [address]  SARS Coronavirus 2 (CEPHEID- Performed in [place] hospital lab), Hosp Order     Status: None   Collection Time: 05/08/19  7:58 PM  Result Value Ref Range   SARS Coronavirus 2 NEGATIVE NEGATIVE    Comment: (NOTE) If result is NEGATIVE SARS-CoV-2 target nucleic acids are NOT DETECTED. The SARS-CoV-2 RNA is generally detectable in upper and lower  respiratory specimens during the acute phase of infection. The lowest  concentration of SARS-CoV-2 viral copies this assay can detect is 250  copies / mL. A negative result does not preclude SARS-CoV-2 infection  and should not be used as the sole basis for treatment or other  patient management decisions.  A negative result may occur with  improper specimen collection / handling, submission of specimen other  than nasopharyngeal swab, presence of viral mutation(s) within the  areas targeted by this assay, and inadequate number of viral copies  (<250 copies / mL). A negative result must be combined with clinical  observations, patient history, and epidemiological information. If result is POSITIVE SARS-CoV-2 target nucleic acids are DETECTED. The SARS-CoV-2 RNA is generally detectable in upper and lower  respiratory specimens dur ing the acute phase of infection.  Positive  results are indicative of active infection with SARS-CoV-2.  Clinical  correlation with patient history and other diagnostic information is  necessary to determine patient infection status.  Positive results do  not rule out bacterial infection or co-infection with other viruses. If result is PRESUMPTIVE POSTIVE SARS-CoV-2 nucleic acids MAY BE PRESENT.   A presumptive positive result was obtained on the submitted specimen  and confirmed on repeat testing.  While 2019 novel coronavirus  (SARS-CoV-2) nucleic acids may be present in  the submitted sample  additional confirmatory testing may be necessary for epidemiological  and / or clinical management purposes  to differentiate between  SARS-CoV-2 and other Sarbecovirus currently known to infect humans.  If clinically indicated additional testing with an alternate test  methodology [phone]) is advised. The SARS-CoV-2 RNA is generally  detectable in upper and  lower respiratory sp ecimens during the acute  phase of infection. The expected result is Negative. Fact Sheet for Patients:  StrictlyIdeas.no Fact Sheet for Healthcare Providers: BankingDealers.co.za This test is not yet approved or cleared by the [place] FDA and has been authorized for detection and/or diagnosis of SARS-CoV-2 by FDA under an Emergency Use Authorization (EUA).  This EUA will remain in effect (meaning this test can be used) for the duration of the COVID-19 declaration under Section 564(b)(1) of the Act, 21 U.S.C. section 360bbb-3(b)(1), unless the authorization is terminated or revoked sooner. Performed at [facility], [address] [place], [place] [address]   Troponin I -     Status: Abnormal   Collection Time: 05/08/19  7:58 PM  Result Value Ref Range   Troponin I 0.06 (HH) <0.03 ng/mL    Comment: CRITICAL RESULT CALLED TO, READ BACK BY AND VERIFIED WITH Alyia Lacerte AT 2055 ON 05/08/2019 JJB Performed at [facility], [address], [place], [place] [address]    Dg Chest Portable 1 View  Result Date: 05/08/2019 CLINICAL DATA:  85 year old female with increased shortness of breath. Test for COVID-19 pending. EXAM: PORTABLE CHEST 1 VIEW COMPARISON:  01/26/2019 and earlier. FINDINGS: Portable AP upright view at 1946 hours. Acute on chronic bilateral pulmonary interstitial opacity. Lung volumes are lower. Stable cardiac size and mediastinal contours. Visualized tracheal air column is within normal limits. No  pneumothorax, pleural effusion or consolidation. Paucity of bowel gas in the upper abdomen. No acute osseous abnormality identified. IMPRESSION: Lower lung volumes with acute on chronic pulmonary interstitial opacity. Consider viral/atypical respiratory infection and pulmonary interstitial edema. Electronically Signed   By: Genevie Ann M.D.   On: 05/08/2019 20:07    Pending Labs Unresulted Labs (From admission, onward)    Start     Ordered   05/08/19 1951  Lactic acid, plasma  STAT Now then every 3 hours,   STAT     05/08/19 1950   05/08/19 1951  Blood Culture (routine x 2)  BLOOD CULTURE X 2,   STAT     05/08/19 1950   05/08/19 1951  Urine culture  ONCE - STAT,   STAT     05/08/19 1950   05/08/19 1951  Urinalysis, Complete w Microscopic  ONCE - STAT,   STAT     05/08/19 1950          Vitals/Pain Today's Vitals   05/08/19 2045 05/08/19 2050 05/08/19 2145 05/08/19 2156  BP:  (!) 146/56  (!) 161/69  Pulse: 82  80   Resp: 20  18   Temp:      TempSrc:      SpO2: 99%  99%     Isolation Precautions Droplet and Contact precautions  Medications Medications  cefTRIAXone (ROCEPHIN) 1 g in sodium chloride 0.9 % 100 mL IVPB (has no administration in time range)  azithromycin (ZITHROMAX) 500 mg in sodium chloride 0.9 % 250 mL IVPB (has no administration in time range)  furosemide (LASIX) injection 20 mg (20 mg Intravenous Given 05/08/19 2152)    Mobility walks with device High fall risk   Focused Assessments Pulmonary Assessment Handoff:  Lung sounds:   O2 Device: Room Air        R Recommendations: See Admitting Provider Note  Report given to:   Additional Notes: on bipap

## 2019-05-08 NOTE — Progress Notes (Signed)
[facility] [place], [place] [address]  Pulmonary Sleep Medicine   Office Visit Note  Patient Name: Lori Johnson DOB: [birth year] MRN [id number]  Date of Service: 05/08/2019  Complaints/HPI: Pt is here reports a DOE with walking.  She has mostly noticed that one month ago it got worse when the weather turned warm. She had 6 minute walk today, that showed desaturation to 88%. The desat was improved with 2.5 lpm of oxygen.    ROS  General: (-) fever, (-) chills, (-) night sweats, (-) weakness Skin: (-) rashes, (-) itching,. Eyes: (-) visual changes, (-) redness, (-) itching. Nose and Sinuses: (-) nasal stuffiness or itchiness, (-) postnasal drip, (-) nosebleeds, (-) sinus trouble. Mouth and Throat: (-) sore throat, (-) hoarseness. Neck: (-) swollen glands, (-) enlarged thyroid, (-) neck pain. Respiratory: - cough, (-) bloody sputum, + shortness of breath, + wheezing. Cardiovascular: + ankle swelling, (-) chest pain. Lymphatic: (-) lymph node enlargement. Neurologic: (-) numbness, (-) tingling. Psychiatric: (-) anxiety, (-) depression   Current Medication: Outpatient Encounter Medications as of 05/08/2019  Medication Sig  . Acetaminophen (TYLENOL ARTHRITIS PAIN PO) Take by mouth as needed.   [name] albuterol (VENTOLIN HFA) 108 (90 Base) MCG/ACT inhaler TAKE 2 PUFFS BY MOUTH EVERY 6 HOURS AS NEEDED FOR WHEEZE OR SHORTNESS OF BREATH  . allopurinol (ZYLOPRIM) 100 MG tablet Take 100 mg by mouth 2 (two) times daily.   [name] amLODipine (NORVASC) 10 MG tablet TAKE 1 TABLET EVERY DAY  . aspirin EC 81 MG tablet Take 81 mg by mouth daily.  . cephALEXin (KEFLEX) 500 MG capsule TAKE 1 CAPSULE EVERY DAY  . colchicine 0.6 MG tablet Take 0.6 mg by mouth as needed.   . furosemide (LASIX) 40 MG tablet Take 40 mg every other day by mouth.   . hydrocortisone valerate cream (WESTCORT) 0.2 % Apply 1 application topically as needed.   . Loratadine (CLARITIN PO) Take by mouth.  .  losartan-hydrochlorothiazide (HYZAAR) 50-12.5 MG tablet TAKE 1 TABLET EVERY DAY  . Multiple Vitamins-Minerals (ICAPS AREDS 2 PO) Take 1 tablet by mouth daily.  . pantoprazole (PROTONIX) 20 MG tablet Take 2 caps. Daily in am.  . pravastatin (PRAVACHOL) 20 MG tablet TAKE 1 TABLET AT BEDTIME  . VITAMIN D PO Take by mouth.  . [DISCONTINUED] levofloxacin (LEVAQUIN) 500 MG tablet Take 1 tablet (500 mg total) by mouth daily. (Patient not taking: Reported on 05/08/2019)   No facility-administered encounter medications on file as of 05/08/2019.     Surgical History: Past Surgical History:  Procedure Laterality Date  . ABDOMINAL HYSTERECTOMY  1981  . arterial bypass right leg Right   . broken left foot Left 01/2002  . CHOLECYSTECTOMY    . CORONARY ANGIOPLASTY WITH STENT PLACEMENT    . stent in leg      Medical History: Past Medical History:  Diagnosis Date  . Angiopathy, peripheral ([place])   . Arterial embolus and thrombosis ([place])   . ASCVD (arteriosclerotic cardiovascular disease)    with stent RCA  . Bilateral bunions   . Cancer ([place])    SCC removed off left leg  . Cervical lymphadenitis   . Chronic gout of left ankle due to renal impairment without tophus   . Chronic infection June 2014   Has chronic infection of right femoral graft with history of extensive debridement in June 2014. Was on oral Clindamycin for 3 weeks for culture positive for anerobes and staph lugdunensis. Vascular Surgeon at [facility]  Rebekah Chesterfield, MD) recommended indefinite suppressive therapy with Cephalexin 500 mg qd and reassessment every 3 months. Will refill Cephalexin.)  . Chronic kidney disease   . COPD (chronic obstructive pulmonary disease) ([place])   . CRF (chronic renal failure)   . GERD (gastroesophageal reflux disease)   . Hiatal hernia   . History of arterial bypass of lower extremity   . Hyperlipidemia   . Hypertension   . Hyperuricemia   . Macular degeneration   . Mass of right side of neck   . Nocturia    . OA (osteoarthritis)   . PVD (peripheral vascular disease) ([place])   . Renal disorder     Family History: Family History  Problem Relation Age of Onset  . Heart disease Mother   . Heart disease Father     Social History: Social History   Socioeconomic History  . Marital status: Widowed    Spouse name: Not on file  . Number of children: 3  . Years of education: Not on file  . Highest education level: 9th grade  Occupational History  . Occupation: retired  Scientific laboratory technician  . Financial resource strain: Not hard at all  . Food insecurity:    Worry: Never true    Inability: Never true  . Transportation needs:    Medical: No    Non-medical: No  Tobacco Use  . Smoking status: Former Smoker    Types: Cigarettes  . Smokeless tobacco: Never Used  . Tobacco comment: quit >30-40 years ago  Substance and Sexual Activity  . Alcohol use: No  . Drug use: No  . Sexual activity: Not on file  Lifestyle  . Physical activity:    Days per week: 0 days    Minutes per session: 0 min  . Stress: Not at all  Relationships  . Social connections:    Talks on phone: Patient refused    Gets together: Patient refused    Attends religious service: Patient refused    Active member of club or organization: Patient refused    Attends meetings of clubs or organizations: Patient refused    Relationship status: Patient refused  . Intimate partner violence:    Fear of current or ex partner: Patient refused    Emotionally abused: Patient refused    Physically abused: Patient refused    Forced sexual activity: Patient refused  Other Topics Concern  . Not on file  Social History Narrative   Lives with daughter. Independent at baseline. Uses walker only for outside    Vital Signs: Blood pressure 136/82, pulse (!) 57, resp. rate 18, height 4\' 11"  (1.499 m), weight 175 lb (79.4 kg), SpO2 96 %.  Examination: General Appearance: The patient is well-developed, well-nourished, and in no  distress. Skin: Gross inspection of skin unremarkable. Head: normocephalic, no gross deformities. Eyes: no gross deformities noted. ENT: ears appear grossly normal no exudates. Neck: Supple. No thyromegaly. No LAD. Respiratory: clear bilaterally. Cardiovascular: Normal S1 and S2 without murmur or rub. Extremities: No cyanosis. pulses are equal. Neurologic: Alert and oriented. No involuntary movements.  LABS: Recent Results (from the past 2160 hour(s))  Pulmonary function test     Status: None   Collection Time: 04/05/19 10:00 AM  Result Value Ref Range   FEV1     FVC     FEV1/FVC     TLC     DLCO      Radiology: Dg Chest 2 View  Result Date: 01/26/2019 CLINICAL DATA:  Cough, shortness of  breath and chest pain since Monday. EXAM: CHEST - 2 VIEW COMPARISON:  Chest x-rays dated 05/19/2018 10/19/2017. Chest CT dated 11/15/2013. FINDINGS: Heart size upper normal, stable. Coarse lung markings are again seen bilaterally, most prominent at the RIGHT lung base, stable, indicating chronic interstitial lung disease/fibrosis. There is a masslike opacity at the posterior costophrenic angle, best seen on the lateral projection, most likely focal eventration of the RIGHT diaphragm based on the AP view, without correlate on earlier chest CT of 11/15/2013. No confluent opacity to suggest a developing pneumonia. No pleural effusion or pneumothorax seen. No acute or suspicious osseous finding. IMPRESSION: 1. No active cardiopulmonary disease. No evidence of pneumonia or pulmonary edema. 2. Chronic interstitial lung disease/fibrosis. 3. Probable focal eventration of the posterior RIGHT hemidiaphragm or Bochdalek's hernia. Would consider chest CT to confirm benignity. At minimum, would recommend follow-up chest x-ray in 3 months to ensure stability. Electronically Signed   By: Franki Cabot M.D.   On: 01/26/2019 16:04    No results found.  No results found.    Assessment and Plan: Patient Active  Problem List   Diagnosis Date Noted  . GI bleed 10/16/2017  . Closed fracture of tibial plateau 06/24/2015  . Current tear knee, medial meniscus 06/24/2015  . Arthritis of knee, degenerative 06/24/2015  . Angiopathy, peripheral ([place]) 06/06/2015  . Arterial vascular disease 06/06/2015  . Bunion 06/06/2015  . Cervical adenitis 06/06/2015  . Gout due to renal impairment of multiple sites 06/06/2015  . Chronic infection 06/06/2015  . Contusion of forearm 06/06/2015  . Arm bruise 06/06/2015  . Asthma, cough variant 06/06/2015  . Gastroesophageal reflux disease with hiatal hernia 06/06/2015  . History of DVT of lower extremity 06/06/2015  . Bergmann's syndrome 06/06/2015  . H/O arterial bypass of lower limb 06/06/2015  . HLD (hyperlipidemia) 06/06/2015  . Benign hypertension 06/06/2015  . Elevated blood uric acid level 06/06/2015  . Disorder of kidney 06/06/2015  . Lump in neck 06/06/2015  . Excessive urination at night 06/06/2015  . Arthritis, degenerative 06/06/2015  . Poor balance 06/06/2015  . Peripheral blood vessel disorder ([place]) 06/06/2015  . Acid reflux 03/08/2013  . Atherosclerosis of native artery of extremity ([place]) 01/22/2013  . Chronic kidney disease 01/22/2013  . CAD in native artery 01/22/2013  . Essential (primary) hypertension 01/22/2013  . Atherosclerosis of native arteries of extremity with rest pain ([place]) 01/22/2013  . Arthralgia of hip or thigh 03/29/2006    1. Hypoxia Patient failed 6 minute walk today, home oxygen ordered.  - For home use only DME oxygen  2. Gastroesophageal reflux disease without esophagitis Stable, continue present management.   3. SOB (shortness of breath) Pt dropped to 88% when walking, and it took 2.5lpm to get sat up.   - 6 minute walk  General Counseling: I have discussed the findings of the evaluation and examination with [place].  I have also discussed any further diagnostic evaluation thatmay be needed or ordered today. Jerzey  verbalizes understanding of the findings of todays visit. We also reviewed her medications today and discussed drug interactions and side effects including but not limited excessive drowsiness and altered mental states. We also discussed that there is always a risk not just to her but also people around her. she has been encouraged to call the office with any questions or concerns that should arise related to todays visit.    Time spent: 25 This patient was seen by Orson Gear AGNP-C in Collaboration with Dr. Devona Konig  as a part of collaborative care agreement.   I have personally obtained a history, examined the patient, evaluated laboratory and imaging results, formulated the assessment and plan and placed orders.    Allyne Gee, MD [facility] [place] [facility] Pulmonary and Critical Care Sleep medicine

## 2019-05-08 NOTE — ED Notes (Signed)
BIPAP INITIATED AT THIS TIME

## 2019-05-09 ENCOUNTER — Inpatient Hospital Stay
Admit: 2019-05-09 | Discharge: 2019-05-09 | Disposition: A | Discharge status: Home / Self Care | Payer: Medicare PPO | Attending: Nurse Practitioner | Admitting: Nurse Practitioner

## 2019-05-09 ENCOUNTER — Inpatient Hospital Stay: Payer: Medicare PPO

## 2019-05-09 DIAGNOSIS — I509 Heart failure, unspecified: Secondary | ICD-10-CM

## 2019-05-09 LAB — CBC WITH DIFFERENTIAL/PLATELET
Abs Immature Granulocytes: 0.03 10*3/uL (ref 0.00–0.07)
Basophils Absolute: 0 10*3/uL (ref 0.0–0.1)
Basophils Relative: 0 %
Eosinophils Absolute: 0 10*3/uL (ref 0.0–0.5)
Eosinophils Relative: 0 %
HCT: 26.6 % — ABNORMAL LOW (ref 36.0–46.0)
Hemoglobin: 8.8 g/dL — ABNORMAL LOW (ref 12.0–15.0)
Immature Granulocytes: 0 %
Lymphocytes Relative: 6 %
Lymphs Abs: 0.6 10*3/uL — ABNORMAL LOW (ref 0.7–4.0)
MCH: 31 pg (ref 26.0–34.0)
MCHC: 33.1 g/dL (ref 30.0–36.0)
MCV: 93.7 fL (ref 80.0–100.0)
Monocytes Absolute: 0.6 10*3/uL (ref 0.1–1.0)
Monocytes Relative: 6 %
Neutro Abs: 8.3 10*3/uL — ABNORMAL HIGH (ref 1.7–7.7)
Neutrophils Relative %: 88 %
Platelets: 141 10*3/uL — ABNORMAL LOW (ref 150–400)
RBC: 2.84 MIL/uL — ABNORMAL LOW (ref 3.87–5.11)
RDW: 13.5 % (ref 11.5–15.5)
WBC: 9.6 10*3/uL (ref 4.0–10.5)
nRBC: 0 % (ref 0.0–0.2)

## 2019-05-09 LAB — BASIC METABOLIC PANEL
Anion gap: 12 (ref 5–15)
BUN: 57 mg/dL — ABNORMAL HIGH (ref 8–23)
CO2: 23 mmol/L (ref 22–32)
Calcium: 9.4 mg/dL (ref 8.9–10.3)
Chloride: 99 mmol/L (ref 98–111)
Creatinine, Ser: 2.48 mg/dL — ABNORMAL HIGH (ref 0.44–1.00)
GFR calc Af Amer: 20 mL/min — ABNORMAL LOW (ref >60)
GFR calc non Af Amer: 17 mL/min — ABNORMAL LOW (ref >60)
Glucose, Bld: 134 mg/dL — ABNORMAL HIGH (ref 70–99)
Potassium: 4.3 mmol/L (ref 3.5–5.1)
Sodium: 134 mmol/L — ABNORMAL LOW (ref 135–145)

## 2019-05-09 LAB — TROPONIN I
Troponin I: 0.32 ng/mL (ref <0.03)
Troponin I: 0.62 ng/mL (ref <0.03)
Troponin I: 0.69 ng/mL (ref <0.03)

## 2019-05-09 LAB — GLUCOSE, CAPILLARY: Glucose-Capillary: 123 mg/dL — ABNORMAL HIGH (ref 70–99)

## 2019-05-09 LAB — MRSA PCR SCREENING: MRSA by PCR: NEGATIVE

## 2019-05-09 MED ORDER — PRAVASTATIN SODIUM 20 MG PO TABS
20.0000 mg | ORAL_TABLET | Freq: Every day | ORAL | Status: DC
  Administered 2019-05-09: 20 mg via ORAL
  Filled 2019-05-09: qty 1

## 2019-05-09 MED ORDER — ONDANSETRON HCL 4 MG/2ML IJ SOLN
4.0000 mg | Freq: Four times a day (QID) | INTRAMUSCULAR | Status: DC | PRN
  Administered 2019-05-09: 23:00:00 4 mg via INTRAVENOUS
  Filled 2019-05-09: qty 2

## 2019-05-09 MED ORDER — FUROSEMIDE 10 MG/ML IJ SOLN
20.0000 mg | Freq: Once | INTRAMUSCULAR | Status: AC
  Administered 2019-05-09: 01:00:00 20 mg via INTRAVENOUS
  Filled 2019-05-09: qty 2

## 2019-05-09 MED ORDER — PANTOPRAZOLE SODIUM 40 MG PO TBEC: 40.0000 mg | DELAYED_RELEASE_TABLET | Freq: Two times a day (BID) | ORAL | Status: DC

## 2019-05-09 MED ORDER — IPRATROPIUM-ALBUTEROL 0.5-2.5 (3) MG/3ML IN SOLN: 3.0000 mL | Freq: Four times a day (QID) | RESPIRATORY_TRACT | Status: DC | PRN

## 2019-05-09 MED ORDER — ORAL CARE MOUTH RINSE
15.0000 mL | Freq: Two times a day (BID) | OROMUCOSAL | Status: DC
  Administered 2019-05-09: 23:00:00 15 mL via OROMUCOSAL

## 2019-05-09 MED ORDER — SODIUM CHLORIDE 0.9% FLUSH: 3.0000 mL | INTRAVENOUS | Status: DC | PRN

## 2019-05-09 MED ORDER — SODIUM CHLORIDE 0.9 % IV SOLN: 250.0000 mL | INTRAVENOUS | Status: DC | PRN

## 2019-05-09 MED ORDER — AMLODIPINE BESYLATE 10 MG PO TABS
10.0000 mg | ORAL_TABLET | Freq: Every day | ORAL | Status: DC
  Administered 2019-05-09: 11:00:00 10 mg via ORAL
  Filled 2019-05-09: qty 1

## 2019-05-09 MED ORDER — FUROSEMIDE 10 MG/ML IJ SOLN
20.0000 mg | Freq: Two times a day (BID) | INTRAMUSCULAR | Status: DC
  Administered 2019-05-09 – 2019-05-10 (×3): 20 mg via INTRAVENOUS
  Filled 2019-05-09 (×3): qty 2

## 2019-05-09 MED ORDER — PANTOPRAZOLE SODIUM 40 MG IV SOLR
40.0000 mg | INTRAVENOUS | Status: DC
  Administered 2019-05-09 (×2): 40 mg via INTRAVENOUS
  Filled 2019-05-09 (×2): qty 40

## 2019-05-09 MED ORDER — SODIUM CHLORIDE 0.9 % IV SOLN: 1.0000 g | INTRAVENOUS | Status: DC

## 2019-05-09 MED ORDER — ASPIRIN EC 81 MG PO TBEC
81.0000 mg | DELAYED_RELEASE_TABLET | Freq: Every day | ORAL | Status: DC
  Administered 2019-05-09: 81 mg via ORAL
  Filled 2019-05-09: qty 1

## 2019-05-09 MED ORDER — HYDROCHLOROTHIAZIDE 12.5 MG PO CAPS
12.5000 mg | ORAL_CAPSULE | Freq: Every day | ORAL | Status: DC
  Filled 2019-05-09: qty 1

## 2019-05-09 MED ORDER — LOSARTAN POTASSIUM 50 MG PO TABS: 50.0000 mg | ORAL_TABLET | Freq: Every day | ORAL | Status: DC

## 2019-05-09 MED ORDER — ALLOPURINOL 100 MG PO TABS
100.0000 mg | ORAL_TABLET | Freq: Two times a day (BID) | ORAL | Status: DC
  Administered 2019-05-09 (×2): 100 mg via ORAL
  Filled 2019-05-09 (×4): qty 1

## 2019-05-09 MED ORDER — SODIUM CHLORIDE 0.9 % IV SOLN: 500.0000 mg | INTRAVENOUS | Status: DC

## 2019-05-09 MED ORDER — ENOXAPARIN SODIUM 30 MG/0.3ML ~~LOC~~ SOLN
30.0000 mg | SUBCUTANEOUS | Status: DC
  Administered 2019-05-09: 30 mg via SUBCUTANEOUS
  Filled 2019-05-09: qty 0.3

## 2019-05-09 MED ORDER — SODIUM CHLORIDE 0.9% FLUSH
3.0000 mL | Freq: Two times a day (BID) | INTRAVENOUS | Status: DC
  Administered 2019-05-09 (×3): 3 mL via INTRAVENOUS

## 2019-05-09 MED ORDER — LOSARTAN POTASSIUM-HCTZ 50-12.5 MG PO TABS: 1.0000 | ORAL_TABLET | Freq: Every day | ORAL | Status: DC

## 2019-05-09 MED ORDER — CHOLECALCIFEROL 10 MCG (400 UNIT) PO TABS
400.0000 [IU] | ORAL_TABLET | Freq: Every day | ORAL | Status: DC
  Administered 2019-05-09: 400 [IU] via ORAL
  Filled 2019-05-09 (×2): qty 1

## 2019-05-09 MED ORDER — ACETAMINOPHEN 325 MG PO TABS: 650.0000 mg | ORAL_TABLET | ORAL | Status: DC | PRN

## 2019-05-09 NOTE — Consult Note (Signed)
[facility] Cardiology  CARDIOLOGY CONSULT NOTE  Patient ID: Lori Johnson MRN: [id number] DOB/AGE: 1930-10-19 85 y.o.  Admit date: 05/08/2019 Referring Physician Gardiner Barefoot Primary Physician Rodena Medin Primary Cardiologist Dr. Lujean Amel  Reason for Consultation Congestive heart failure   HPI: Lori Johnson is an 85 year old female with a past medical history significant for arteriosclerotic cardiovascular disease s/p PCI to RCA, moderate aortic stenosis, peripheral vascular disease, COPD, chronic renal failure, hypertension, and hyperlipidemia who presented to the ED on 05/08/19 with worsening shortness of breath, generalized abdomina pain, and a cough.  Workup in the ED was significant for BNP 1,949, initial troponin .06, creatinine 2.43, and chest xray significant for pulmonary interstitial edema with probable viral infection.  She required BiPAP, was given IV Lasix, started on abx, and admitted to the unit.  Today, Lori Johnson reports significant improvement in shortness of breath.  Denies chest pain or palpitations.  Lower extremity swelling has resolved.   She is followed in outpatient cardiology at [facility] by Dr. Clayborn Bigness.  Echocardiogram in 2018 revealed normal LV function with an EF estimated at 55 to 65%.  There was moderate to severe aortic stenosis with a valve area calculated at .78cm2, mild MR.   Review of systems complete and found to be negative unless listed above     Past Medical History:  Diagnosis Date  . Angiopathy, peripheral ([place])   . Arterial embolus and thrombosis ([place])   . ASCVD (arteriosclerotic cardiovascular disease)    with stent RCA  . Bilateral bunions   . Cancer ([place])    SCC removed off left leg  . Cervical lymphadenitis   . Chronic gout of left ankle due to renal impairment without tophus   . Chronic infection June 2014   Has chronic infection of right femoral graft with history of extensive debridement in June 2014. Was on oral Clindamycin for 3 weeks  for culture positive for anerobes and staph lugdunensis. Vascular Surgeon at [facility] Remonia Richter, MD) recommended indefinite suppressive therapy with Cephalexin 500 mg qd and reassessment every 3 months. Will refill Cephalexin.)  . Chronic kidney disease   . COPD (chronic obstructive pulmonary disease) ([place])   . CRF (chronic renal failure)   . GERD (gastroesophageal reflux disease)   . Hiatal hernia   . History of arterial bypass of lower extremity   . Hyperlipidemia   . Hypertension   . Hyperuricemia   . Macular degeneration   . Mass of right side of neck   . Nocturia   . OA (osteoarthritis)   . PVD (peripheral vascular disease) ([place])   . Renal disorder     Past Surgical History:  Procedure Laterality Date  . ABDOMINAL HYSTERECTOMY  1981  . arterial bypass right leg Right   . broken left foot Left 01/2002  . CHOLECYSTECTOMY    . CORONARY ANGIOPLASTY WITH STENT PLACEMENT    . stent in leg      Medications Prior to Admission  Medication Sig Dispense Refill Last Dose  . Acetaminophen (TYLENOL ARTHRITIS PAIN PO) Take by mouth as needed.    prn at prn  . albuterol (VENTOLIN HFA) 108 (90 Base) MCG/ACT inhaler TAKE 2 PUFFS BY MOUTH EVERY 6 HOURS AS NEEDED FOR WHEEZE OR SHORTNESS OF BREATH 18 Inhaler 3 prn at prn  . allopurinol (ZYLOPRIM) 100 MG tablet Take 100 mg by mouth 2 (two) times daily.    unknown at unknown  . amLODipine (NORVASC) 10 MG tablet TAKE 1 TABLET EVERY  DAY 90 tablet 3 unknown at unknown  . aspirin EC 81 MG tablet Take 81 mg by mouth daily.   unknown at unknown  . cephALEXin (KEFLEX) 500 MG capsule TAKE 1 CAPSULE EVERY DAY 90 capsule 0 unknown at unknown  . colchicine 0.6 MG tablet Take 0.6 mg by mouth as needed.    prn at prn  . furosemide (LASIX) 40 MG tablet Take 40 mg every other day by mouth.    unknwon at unknown  . hydrocortisone valerate cream (WESTCORT) 0.2 % Apply 1 application topically as needed.    prn at prn  . Loratadine (CLARITIN PO) Take by mouth.    unknown at unknown  . losartan-hydrochlorothiazide (HYZAAR) 50-12.5 MG tablet TAKE 1 TABLET EVERY DAY 90 tablet 3 unknown at unknown  . Multiple Vitamins-Minerals (ICAPS AREDS 2 PO) Take 1 tablet by mouth daily.   unknwon at [employer]  . pantoprazole (PROTONIX) 20 MG tablet Take 2 caps. Daily in am. 180 tablet 3 unknown at unknown  . pravastatin (PRAVACHOL) 20 MG tablet TAKE 1 TABLET AT BEDTIME 90 tablet 3 unknwon at [place]  . VITAMIN D PO Take by mouth.   unknwon at unknown   Social History   Socioeconomic History  . Marital status: Widowed    Spouse name: Not on file  . Number of children: 3  . Years of education: Not on file  . Highest education level: 9th grade  Occupational History  . Occupation: retired  Scientific laboratory technician  . Financial resource strain: Not hard at all  . Food insecurity:    Worry: Never true    Inability: Never true  . Transportation needs:    Medical: No    Non-medical: No  Tobacco Use  . Smoking status: Former Smoker    Types: Cigarettes  . Smokeless tobacco: Never Used  . Tobacco comment: quit >30-40 years ago  Substance and Sexual Activity  . Alcohol use: No  . Drug use: No  . Sexual activity: Not on file  Lifestyle  . Physical activity:    Days per week: 0 days    Minutes per session: 0 min  . Stress: Not at all  Relationships  . Social connections:    Talks on phone: Patient refused    Gets together: Patient refused    Attends religious service: Patient refused    Active member of club or organization: Patient refused    Attends meetings of clubs or organizations: Patient refused    Relationship status: Patient refused  . Intimate partner violence:    Fear of current or ex partner: Patient refused    Emotionally abused: Patient refused    Physically abused: Patient refused    Forced sexual activity: Patient refused  Other Topics Concern  . Not on file  Social History Narrative   Lives with daughter. Independent at baseline. Uses walker only  for outside    Family History  Problem Relation Age of Onset  . Heart disease Mother   . Heart disease Father       Review of systems complete and found to be negative unless listed above      PHYSICAL EXAM  General: Well developed, well nourished, in no acute distress HEENT:  Normocephalic and atramatic Neck:  No JVD.  Lungs: On nasal cannula.  Clear bilaterally to auscultation and percussion. Heart: HRRR . 3/6 crescendo decrescendo systolic murmur appreciated. No gallops or rubs Abdomen: Bowel sounds are positive, abdomen soft and non-tender  Msk:  Back  normal. Normal strength and tone for age. Extremities: No clubbing, cyanosis or edema.   Neuro: Alert and oriented X 3. Psych:  Good affect, responds appropriately  Labs:   Lab Results  Component Value Date   WBC 9.6 05/09/2019   HGB 8.8 (L) 05/09/2019   HCT 26.6 (L) 05/09/2019   MCV 93.7 05/09/2019   PLT 141 (L) 05/09/2019    Recent Labs  Lab 05/08/19 1958 05/09/19 0018  NA 133* 134*  K 4.1 4.3  CL 98 99  CO2 21* 23  BUN 57* 57*  CREATININE 2.43* 2.48*  CALCIUM 9.5 9.4  PROT 7.2  --   BILITOT 1.2  --   ALKPHOS 65  --   ALT 15  --   AST 27  --   GLUCOSE 140* 134*   Lab Results  Component Value Date   CKTOTAL 114 10/28/2013   CKMB 4.0 (H) 10/27/2013   TROPONINI 0.69 (HH) 05/09/2019    Lab Results  Component Value Date   CHOL 176 12/06/2015   CHOL 149 10/28/2013   Lab Results  Component Value Date   HDL 36 (L) 12/06/2015   HDL 44 10/28/2013   Lab Results  Component Value Date   LDLCALC 102 (H) 12/06/2015   LDLCALC 95 10/28/2013   Lab Results  Component Value Date   TRIG 191 (H) 12/06/2015   TRIG 52 10/28/2013   Lab Results  Component Value Date   CHOLHDL 4.9 (H) 12/06/2015   No results found for: LDLDIRECT    Radiology: Dg Chest Portable 1 View  Result Date: 05/08/2019 CLINICAL DATA:  85 year old female with increased shortness of breath. Test for COVID-19 pending. EXAM: PORTABLE  CHEST 1 VIEW COMPARISON:  01/26/2019 and earlier. FINDINGS: Portable AP upright view at 1946 hours. Acute on chronic bilateral pulmonary interstitial opacity. Lung volumes are lower. Stable cardiac size and mediastinal contours. Visualized tracheal air column is within normal limits. No pneumothorax, pleural effusion or consolidation. Paucity of bowel gas in the upper abdomen. No acute osseous abnormality identified. IMPRESSION: Lower lung volumes with acute on chronic pulmonary interstitial opacity. Consider viral/atypical respiratory infection and pulmonary interstitial edema. Electronically Signed   By: Genevie Ann M.D.   On: 05/08/2019 20:07    EKG: Sinus rhythm; non-specific ST/T wave abnormalities   ASSESSMENT AND PLAN:  1.  Acute respiratory distress   -Off of BiPAP, on nasal cannula; home oxygen discussed upon discharge  -Lasix PRN with close monitoring of renal function  2.  Elevated troponin (.06, .32, .69)   -Likely in the setting of acute respiratory failure, acute on chronic CHF  -In the absence of chest pain and acute on chronic renal failure, likely not a good candidate for further invasive workup with cardiac catheterization  3.  Moderate aortic stenosis   -Echocardiogram pending to monitor progression  4.  Acute on chronic CHF  -Lasix PRN with close monitoring of renal function   -Continue home medications     The history, physical exam findings, and plan of care were all discussed with Dr. Bartholome Bill, and all decision making was made in collaboration.   Signed: Avie Arenas PA-C 05/09/2019, 7:39 AM   Pt seen and examined. Agree with above.

## 2019-05-09 NOTE — Progress Notes (Signed)
[place] [facility] [place] NAME: Lori Johnson    MR#:  [id number]  DATE OF BIRTH:  [birth year]  SUBJECTIVE:  Off of Bipap  Sob improved   REVIEW OF SYSTEMS:    Review of Systems  Constitutional: Negative for fever, chills weight loss HENT: Negative for ear pain, nosebleeds, congestion, facial swelling, rhinorrhea, neck pain, neck stiffness and ear discharge.   Respiratory: ++ for cough, shortness of breath (IMPROVED), no wheezing  Cardiovascular: Negative for chest pain, palpitations and ++ LEE.  Gastrointestinal: Negative for heartburn, abdominal pain, vomiting, diarrhea or consitpation Genitourinary: Negative for dysuria, urgency, frequency, hematuria Musculoskeletal: Negative for back pain or joint pain Neurological: Negative for dizziness, seizures, syncope, focal weakness,  numbness and headaches.  Hematological: Does not bruise/bleed easily.  Psychiatric/Behavioral: Negative for hallucinations, confusion, dysphoric mood    Tolerating Diet: yes      DRUG ALLERGIES:   Allergies  Allergen Reactions  . Pletal [Cilostazol] Swelling  . Erythromycin Hives  . Codeine Nausea Only and Nausea And Vomiting  . Erythromycin Base Itching and Rash  . Penicillins Hives, Itching and Rash  . Sulfa Antibiotics Rash    VITALS:  Blood pressure (!) 147/47, pulse (!) 58, temperature 98.1 F (36.7 C), temperature source Oral, resp. rate 16, height 5\' 2"  (1.575 m), weight 76.4 kg, SpO2 99 %.  PHYSICAL EXAMINATION:  Constitutional: Appearsfrail No distress. HENT: Normocephalic. [name] Oropharynx is clear and moist.  Eyes: Conjunctivae and EOM are normal. PERRLA, no scleral icterus.  Neck: Normal ROM. Neck supple. No JVD. No tracheal deviation. CVS: RRR, S1/S2 +,3/6 murmurs, no gallops, no carotid bruit.  Pulmonary: Effort and breath sounds normal, no stridor, rhonchi, wheezes, rales.  Abdominal: Soft. BS +,  no distension, tenderness, rebound or  guarding.  Musculoskeletal: Normal range of motion. No edema and no tenderness.  Neuro: Alert. CN 2-12 grossly intact. No focal deficits. Skin: Skin is warm and dry. No rash noted. Psychiatric: Normal mood and affect.      LABORATORY PANEL:   CBC Recent Labs  Lab 05/09/19 0155  WBC 9.6  HGB 8.8*  HCT 26.6*  PLT 141*   ------------------------------------------------------------------------------------------------------------------  Chemistries  Recent Labs  Lab 05/08/19 1958 05/09/19 0018  NA 133* 134*  K 4.1 4.3  CL 98 99  CO2 21* 23  GLUCOSE 140* 134*  BUN 57* 57*  CREATININE 2.43* 2.48*  CALCIUM 9.5 9.4  AST 27  --   ALT 15  --   ALKPHOS 65  --   BILITOT 1.2  --    ------------------------------------------------------------------------------------------------------------------  Cardiac Enzymes Recent Labs  Lab 05/08/19 1958 05/09/19 0018 05/09/19 0649  TROPONINI 0.06* 0.32* 0.69*   ------------------------------------------------------------------------------------------------------------------  RADIOLOGY:  Dg Chest Port 1 View  Result Date: 05/09/2019 CLINICAL DATA:  Follow-up infiltrates EXAM: PORTABLE CHEST 1 VIEW COMPARISON:  05/08/2019 FINDINGS: Cardiac shadow is stable. Aortic calcifications are again seen. Bibasilar opacities are again seen and stable. Some improved vascular congestion is noted. No sizable effusion is noted. No bony abnormality is seen. IMPRESSION: Persistent bibasilar infiltrates. Electronically Signed   By: Inez Catalina M.D.   On: 05/09/2019 07:49   Dg Chest Portable 1 View  Result Date: 05/08/2019 CLINICAL DATA:  85 year old female with increased shortness of breath. Test for COVID-19 pending. EXAM: PORTABLE CHEST 1 VIEW COMPARISON:  01/26/2019 and earlier. FINDINGS: Portable AP upright view at 1946 hours. Acute on chronic bilateral pulmonary interstitial opacity. Lung volumes are lower. Stable cardiac size and mediastinal  contours. Visualized tracheal air column is within normal limits. No pneumothorax, pleural effusion or consolidation. Paucity of bowel gas in the upper abdomen. No acute osseous abnormality identified. IMPRESSION: Lower lung volumes with acute on chronic pulmonary interstitial opacity. Consider viral/atypical respiratory infection and pulmonary interstitial edema. Electronically Signed   By: Genevie Ann M.D.   On: 05/08/2019 20:07     ASSESSMENT AND PLAN:   85 year old female with history of aortic stenosis, PVD and COPD who presented to the ER due to shortness of breath.  1.  Acute hypoxic respiratory failure in the setting of acute on chronic systolic heart failure from underlying aortic stenosis: Patient weaned from BiPAP to nasal cannula. Patient ruled out for COVID-19.   2.  Acute on chronic systolic heart failure: Gentle diuresis with IV Lasix  follow-up on echocardiogram Monitor intake and output Daily weight  3.  Community-acquired pneumonia: Continue azithromycin and ceftriaxone. 4.  Essential hypertension: Continue Norvasc  5.  Hyperlipidemia: Continue statin  6.  PVD: Continue aspirin and statin.  7.  Chronic kidney disease stage 4: Continue to monitor creatinine.    8.  Elevated troponin in the setting of demand ischemia from above issue  Patient ruled out for ACS.    9.  Chronic anemia: Continue to monitor hemoglobin.    Management plans discussed with the patient and she is in agreement.  CODE STATUS: full  TOTAL TIME TAKING CARE OF THIS PATIENT 31 minutes.     POSSIBLE D/C 2 days, DEPENDING ON CLINICAL CONDITION.   Bettey Costa M.D on 05/09/2019 at 12:48 PM  Between 7am to 6pm - Pager - [phone] After 6pm go to [email or web address] - password [email or web address] [facility]  Office  [phone]  CC: Primary care physician; Margo Common, PA  Note: This dictation was prepared with Dragon dictation along with smaller phrase technology. Any  transcriptional errors that result from this process are unintentional.

## 2019-05-09 NOTE — Evaluation (Signed)
Physical Therapy Evaluation Patient Details Name: Lori Johnson MRN: [id number] DOB: 06-14-1930 Today's Date: 05/09/2019   History of Present Illness  Patient is a pleasant 85 year old female admitted for acute respiratory failure and CHF. PMH includes COPD, CAD PAD, chronic renal failure, HTN, HLD, aortic valve stenosis, GI bleed, arterial embolus+thrombosis, ASCVD, cancer, and cervical lymphadenitis. Patient had seen pulmonary doctor prior to admission and was having desaturation during 6 minute walk test   Clinical Impression  Patient is a pleasant 85 year old female who presents with limited balance and mobility. Patient's evaluation limited by intermittent episodes of a fib as well as v tach upon attempt of ambulation. Patient's nurse notified. Sp02 levels monitored, maintaining >96% with all mobility on room air. Patient requires use of single hand assist for standing and ambulation and would benefit from further training with RW. Patient is eager to participate in physical therapy and has good motivation for further progression. Patient will benefit from skilled physical therapy to improve balance, strength, and capacity for mobility to decrease falls risk. Upon discharge patient will benefit from intermittent supervision/aide and HHPT.     Follow Up Recommendations Home health PT;Supervision - Intermittent    Equipment Recommendations  Rolling walker with 5" wheels;3in1 (PT)    Recommendations for Other Services       Precautions / Restrictions Precautions Precautions: Fall Restrictions Weight Bearing Restrictions: No      Mobility  Bed Mobility               General bed mobility comments: patient in chair and returned to chair, bed mobility not seen   Transfers Overall transfer level: Needs assistance Equipment used: 1 person hand held assist Transfers: Sit to/from Stand Sit to Stand: Min guard         General transfer comment: Patient performed STS with CGA and  verbal cueing for sequencing/safety  Ambulation/Gait Ambulation/Gait assistance: Min assist Gait Distance (Feet): 25 Feet Assistive device: 1 person hand held assist Gait Pattern/deviations: Shuffle;Trunk flexed;Narrow base of support Gait velocity: decreased   General Gait Details: Patient requires HHA, ambulation terminated due to v tach. V tach resolved in sitting.   Stairs            Wheelchair Mobility    Modified Rankin (Stroke Patients Only)       Balance Overall balance assessment: Needs assistance Sitting-balance support: Feet supported Sitting balance-Leahy Scale: Fair Sitting balance - Comments: not tested fully due to intermittent a fib.    Standing balance support: Single extremity supported Standing balance-Leahy Scale: Fair Standing balance comment: Patient requires SUE support for standing support                             Pertinent Vitals/Pain Pain Assessment: No/denies pain    Home Living Family/patient expects to be discharged to:: Private residence Living Arrangements: Children(daughter) Available Help at Discharge: Family;Available 24 hours/day(daughter lives with patient) Type of Home: House Home Access: Stairs to enter Entrance Stairs-Rails: None Entrance Stairs-Number of Steps: 3 Home Layout: Two level;Able to live on main level with bedroom/bathroom Home Equipment: Walker - 4 wheels;Bedside commode;Cane - single point Additional Comments: Patient reports they were going to get their bathroom renovated prior to [place]     Prior Function Level of Independence: Needs assistance   Gait / Transfers Assistance Needed: Patient reports she does not use an AD for ambulation, denies any recent falls.   ADL's / [employer]  Needed: reports she cooks and cleans but her daughter helps         Hand Dominance        Extremity/Trunk Assessment   Upper Extremity Assessment Upper Extremity Assessment: Generalized  weakness;Overall WFL for tasks assessed    Lower Extremity Assessment Lower Extremity Assessment: RLE deficits/detail;LLE deficits/detail RLE Deficits / Details: grossly 4-/5  RLE Sensation: decreased light touch RLE Coordination: decreased gross motor LLE Deficits / Details: grossly 4-/5 LLE Sensation: decreased light touch LLE Coordination: decreased gross motor       Communication   Communication: No difficulties  Cognition Arousal/Alertness: Awake/alert Behavior During Therapy: WFL for tasks assessed/performed Overall Cognitive Status: No family/caregiver present to determine baseline cognitive functioning                                 General Comments: Per previous documentation patient does have history of memory impairment      General Comments General comments (skin integrity, edema, etc.): noted fraility with intermittent bruising    Exercises Other Exercises Other Exercises: patient educated on safe transfers and ambulation to decrease falls risk Other Exercises: patient Sp02 monitored: remained >95% with all activity on room air   Assessment/Plan    PT Assessment Patient needs continued PT services  PT Problem List Decreased strength;Decreased activity tolerance;Decreased balance;Decreased mobility;Decreased knowledge of precautions;Decreased knowledge of use of DME;Decreased cognition;Decreased coordination;Cardiopulmonary status limiting activity       PT Treatment Interventions DME instruction;Gait training;Stair training;Functional mobility training;Therapeutic activities;Patient/family education;Cognitive remediation;Neuromuscular re-education;Balance training;Therapeutic exercise    PT Goals (Current goals can be found in the Care Plan section)  Acute Rehab PT Goals Patient Stated Goal: to return home PT Goal Formulation: With patient Time For Goal Achievement: 05/23/19 Potential to Achieve Goals: Fair    Frequency Min 2X/week    Barriers to discharge Decreased caregiver support patient will benefit from an aide and HHPT    Co-evaluation               AM-PAC PT "6 Clicks" Mobility  Outcome Measure Help needed turning from your back to your side while in a flat bed without using bedrails?: A Little Help needed moving from lying on your back to sitting on the side of a flat bed without using bedrails?: A Little Help needed moving to and from a bed to a chair (including a wheelchair)?: A Little Help needed standing up from a chair using your arms (e.g., wheelchair or bedside chair)?: A Little Help needed to walk in hospital room?: A Little Help needed climbing 3-5 steps with a railing? : A Lot 6 Click Score: 17    End of Session Equipment Utilized During Treatment: Gait belt Activity Tolerance: Patient tolerated treatment well;Treatment limited secondary to medical complications (Comment)(v tach : nursing notified) Patient left: in chair;with call bell/phone within reach;with chair alarm set Nurse Communication: Mobility status;Other (comment)(v tach and a fib episodes, Sp02>95% on room air with activity ) PT Visit Diagnosis: Unsteadiness on feet (R26.81);Other abnormalities of gait and mobility (R26.89);Muscle weakness (generalized) (M62.81);Difficulty in walking, not elsewhere classified (R26.2)    Time: 6720-9470 PT Time Calculation (min) (ACUTE ONLY): 17 min   Charges:   PT Evaluation $PT Eval Moderate Complexity: 1 Mod         Janna Arch, PT, DPT    Janna Arch 05/09/2019, 4:54 PM

## 2019-05-09 NOTE — Progress Notes (Signed)
*  PRELIMINARY RESULTS* Echocardiogram 2D Echocardiogram has been performed.  Lori Johnson 05/09/2019, 1:23 PM

## 2019-05-09 NOTE — Progress Notes (Signed)
eLink Physician-Brief Progress Note Patient Name: Lori Johnson DOB: [birth year] MRN: [id number]   Date of Service  05/09/2019  HPI/Events of Note  85 yo female admitted with acute respiratory failure secondary to pulmonary edema and possible pneumonia requiring Bipap. PCCM asked to assume care. VSS.  eICU Interventions  No new orders.      Intervention Category Evaluation Type: New Patient Evaluation  Sommer,Steven Eugene 05/09/2019, 12:12 AM

## 2019-05-10 ENCOUNTER — Encounter: Payer: Self-pay | Admitting: Internal Medicine

## 2019-05-10 LAB — BASIC METABOLIC PANEL
Anion gap: 13 (ref 5–15)
BUN: 55 mg/dL — ABNORMAL HIGH (ref 8–23)
CO2: 25 mmol/L (ref 22–32)
Calcium: 9.7 mg/dL (ref 8.9–10.3)
Chloride: 98 mmol/L (ref 98–111)
Creatinine, Ser: 2.38 mg/dL — ABNORMAL HIGH (ref 0.44–1.00)
GFR calc Af Amer: 21 mL/min — ABNORMAL LOW (ref >60)
GFR calc non Af Amer: 18 mL/min — ABNORMAL LOW (ref >60)
Glucose, Bld: 109 mg/dL — ABNORMAL HIGH (ref 70–99)
Potassium: 3.9 mmol/L (ref 3.5–5.1)
Sodium: 136 mmol/L (ref 135–145)

## 2019-05-10 LAB — URINE CULTURE: Culture: <10000 — AB

## 2019-05-10 LAB — ECHOCARDIOGRAM COMPLETE
Height: 62 in
Weight: 2694.9 oz

## 2019-05-10 MED ORDER — CEFDINIR 300 MG PO CAPS: 300.0000 mg | ORAL_CAPSULE | Freq: Every day | ORAL | 0 refills | Status: AC

## 2019-05-10 NOTE — Discharge Summary (Signed)
[facility] [place] NAME: Lori Johnson    MR#:  [id number]  DATE OF BIRTH:  07/31/1930  DATE OF ADMISSION:  05/08/2019 ADMITTING PHYSICIAN: Christel Mormon, MD  DATE OF DISCHARGE: 05/10/2019  PRIMARY CARE PHYSICIAN: Chrismon, Vickki Muff, PA    ADMISSION DIAGNOSIS:  Shortness of breath [R06.02] Acute pulmonary edema (HCC) [J81.0] Respiratory distress [R06.03]  DISCHARGE DIAGNOSIS:  Active Problems:   Acute respiratory failure (HCC)   Acute CHF (congestive heart failure) ([place])   SECONDARY DIAGNOSIS:   Past Medical History:  Diagnosis Date  . Angiopathy, peripheral ([place])   . Arterial embolus and thrombosis ([place])   . ASCVD (arteriosclerotic cardiovascular disease)    with stent RCA  . Bilateral bunions   . Cancer ([place])    SCC removed off left leg  . Cervical lymphadenitis   . Chronic gout of left ankle due to renal impairment without tophus   . Chronic infection June 2014   Has chronic infection of right femoral graft with history of extensive debridement in June 2014. Was on oral Clindamycin for 3 weeks for culture positive for anerobes and staph lugdunensis. Vascular Surgeon at [facility] Remonia Richter, MD) recommended indefinite suppressive therapy with Cephalexin 500 mg qd and reassessment every 3 months. Will refill Cephalexin.)  . Chronic kidney disease   . COPD (chronic obstructive pulmonary disease) ([place])   . CRF (chronic renal failure)   . GERD (gastroesophageal reflux disease)   . Hiatal hernia   . History of arterial bypass of lower extremity   . Hyperlipidemia   . Hypertension   . Hyperuricemia   . Macular degeneration   . Mass of right side of neck   . Nocturia   . OA (osteoarthritis)   . PVD (peripheral vascular disease) ([place])   . Renal disorder     HOSPITAL COURSE:  85 year old female with history of aortic stenosis, PVD and COPD who presented to the ER due to shortness of breath.  1.  Acute hypoxic respiratory failure in  the setting of acute on chronic systolic heart failure from underlying aortic stenosis: Patient has been weaned from BiPAP. She was most recently prescribed PRN oxygen.   She is currently not requiring oxygen.   Patient ruled out for COVID-19.   2.  Acute on chronic systolic heart failure:  Patient is euvolemic.  She will follow-up with CHF clinic upon discharge.  Most recent echocardiogram during this admission shows IMPRESSIONS    1. The left ventricle has low normal systolic function, with an ejection fraction of 50-55%. The cavity size was normal. There is moderate concentric left ventricular hypertrophy. Left ventricular diastolic Doppler parameters are consistent with  impaired relaxation.  2. The right ventricle has normal systolic function. The cavity was normal. There is no increase in right ventricular wall thickness.  3. Left atrial size was mildly dilated.  4. The mitral valve was not well visualized. Moderate thickening of the mitral valve leaflet. Moderate calcification of the mitral valve leaflet.  5. The aortic valve was not well visualized. Moderate thickening of the aortic valve. Moderate calcification of the aortic valve. Aortic valve regurgitation is trivial by color flow Doppler. Moderate-severe stenosis of the aortic valve.  3.  Community-acquired pneumonia: She will continue cefdinir as an outpatient to complete course of treatment.   4.  Essential hypertension: Continue Norvasc  5.  Hyperlipidemia: Continue statin  6.  PVD: Continue aspirin and statin.  7.  Chronic kidney disease  stage 4: Creatinine has remained stable.  8.  Elevated troponin in the setting of demand ischemia from above issue  Patient was ruled out for ACS.    9.  Chronic anemia: No acute issues.  DISCHARGE CONDITIONS AND DIET:   Stable for discharge regular diet  CONSULTS OBTAINED:  Treatment Team:  Pccm, Ander Gaster, MD Teodoro Spray, MD  DRUG ALLERGIES:   Allergies   Allergen Reactions  . Pletal [Cilostazol] Swelling  . Erythromycin Hives  . Codeine Nausea Only and Nausea And Vomiting  . Erythromycin Base Itching and Rash  . Penicillins Hives, Itching and Rash  . Sulfa Antibiotics Rash    DISCHARGE MEDICATIONS:   Allergies as of 05/10/2019      Reactions   Pletal [cilostazol] Swelling   Erythromycin Hives   Codeine Nausea Only, Nausea And Vomiting   Erythromycin Base Itching, Rash   Penicillins Hives, Itching, Rash   Sulfa Antibiotics Rash      Medication List    STOP taking these medications   cephALEXin 500 MG capsule Commonly known as:  KEFLEX     TAKE these medications   albuterol 108 (90 Base) MCG/ACT inhaler Commonly known as:  VENTOLIN HFA TAKE 2 PUFFS BY MOUTH EVERY 6 HOURS AS NEEDED FOR WHEEZE OR SHORTNESS OF BREATH   allopurinol 100 MG tablet Commonly known as:  ZYLOPRIM Take 100 mg by mouth 2 (two) times daily. Notes to patient:  Take this morning   amLODipine 10 MG tablet Commonly known as:  NORVASC TAKE 1 TABLET EVERY DAY Notes to patient:  Take this morning   aspirin EC 81 MG tablet Take 81 mg by mouth daily. Notes to patient:  Take this morning   cefdinir 300 MG capsule Commonly known as:  OMNICEF Take 1 capsule (300 mg total) by mouth daily for 3 days. Notes to patient:  Take today   CLARITIN PO Take by mouth. Notes to patient:  Take today   colchicine 0.6 MG tablet Take 0.6 mg by mouth as needed.   furosemide 40 MG tablet Commonly known as:  LASIX Take 40 mg every other day by mouth. Notes to patient:  Take tomorrow   hydrocortisone valerate cream 0.2 % Commonly known as:  WESTCORT Apply 1 application topically as needed.   ICAPS AREDS 2 PO Take 1 tablet by mouth daily. Notes to patient:  Take today   losartan-hydrochlorothiazide 50-12.5 MG tablet Commonly known as:  HYZAAR TAKE 1 TABLET EVERY DAY Notes to patient:  Take today   pantoprazole 20 MG tablet Commonly known as:   PROTONIX Take 2 caps. Daily in am. Notes to patient:  Take today   pravastatin 20 MG tablet Commonly known as:  PRAVACHOL TAKE 1 TABLET AT BEDTIME Notes to patient:  Take tonight   TYLENOL ARTHRITIS PAIN PO Take by mouth as needed.   VITAMIN D PO Take by mouth. Notes to patient:  Take today         Today   CHIEF COMPLAINT:  Patient wants to go home.  She reports no wheezing or shortness of breath.   VITAL SIGNS:  Blood pressure (!) 159/139, pulse (!) 59, temperature (!) 97.3 F (36.3 C), temperature source Axillary, resp. rate 17, height 5\' 2"  (1.575 m), weight 76.4 kg, SpO2 98 %.   REVIEW OF SYSTEMS:  Review of Systems  Constitutional: Negative.  Negative for chills, fever and malaise/fatigue.  HENT: Negative.  Negative for ear discharge, ear pain, hearing loss, nosebleeds and sore throat.  Eyes: Negative.  Negative for blurred vision and pain.  Respiratory: Negative.  Negative for cough, hemoptysis, shortness of breath and wheezing.   Cardiovascular: Negative.  Negative for chest pain, palpitations and leg swelling.  Gastrointestinal: Negative.  Negative for abdominal pain, blood in stool, diarrhea, nausea and vomiting.  Genitourinary: Negative.  Negative for dysuria.  Musculoskeletal: Negative.  Negative for back pain.  Skin: Negative.   Neurological: Negative for dizziness, tremors, speech change, focal weakness, seizures and headaches.  Endo/Heme/Allergies: Negative.  Does not bruise/bleed easily.  Psychiatric/Behavioral: Negative.  Negative for depression, hallucinations and suicidal ideas.     PHYSICAL EXAMINATION:  GENERAL:  85 y.o.-year-old patient lying in the bed with no acute distress.  NECK:  Supple, no jugular venous distention. No thyroid enlargement, no tenderness.  LUNGS: Normal breath sounds bilaterally, no wheezing, rales,rhonchi  No use of accessory muscles of respiration.  CARDIOVASCULAR: S1, S2 normal. 3/6 murmurs, no rubs, or gallops.   ABDOMEN: Soft, non-tender, non-distended. Bowel sounds present. No organomegaly or mass.  EXTREMITIES: No pedal edema, cyanosis, or clubbing.  PSYCHIATRIC: The patient is alert and oriented x 3.  SKIN: No obvious rash, lesion, or ulcer.   DATA REVIEW:   CBC Recent Labs  Lab 05/09/19 0155  WBC 9.6  HGB 8.8*  HCT 26.6*  PLT 141*    Chemistries  Recent Labs  Lab 05/08/19 1958  05/10/19 0551  NA 133*   < > 136  K 4.1   < > 3.9  CL 98   < > 98  CO2 21*   < > 25  GLUCOSE 140*   < > 109*  BUN 57*   < > 55*  CREATININE 2.43*   < > 2.38*  CALCIUM 9.5   < > 9.7  AST 27  --   --   ALT 15  --   --   ALKPHOS 65  --   --   BILITOT 1.2  --   --    < > = values in this interval not displayed.    Cardiac Enzymes Recent Labs  Lab 05/09/19 0018 05/09/19 0649 05/09/19 1242  TROPONINI 0.32* 0.69* 0.62*    Microbiology Results  @MICRORSLT48 @  RADIOLOGY:  Dg Chest Port 1 View  Result Date: 05/09/2019 CLINICAL DATA:  Follow-up infiltrates EXAM: PORTABLE CHEST 1 VIEW COMPARISON:  05/08/2019 FINDINGS: Cardiac shadow is stable. Aortic calcifications are again seen. Bibasilar opacities are again seen and stable. Some improved vascular congestion is noted. No sizable effusion is noted. No bony abnormality is seen. IMPRESSION: Persistent bibasilar infiltrates. Electronically Signed   By: Inez Catalina M.D.   On: 05/09/2019 07:49   Dg Chest Portable 1 View  Result Date: 05/08/2019 CLINICAL DATA:  85 year old female with increased shortness of breath. Test for COVID-19 pending. EXAM: PORTABLE CHEST 1 VIEW COMPARISON:  01/26/2019 and earlier. FINDINGS: Portable AP upright view at 1946 hours. Acute on chronic bilateral pulmonary interstitial opacity. Lung volumes are lower. Stable cardiac size and mediastinal contours. Visualized tracheal air column is within normal limits. No pneumothorax, pleural effusion or consolidation. Paucity of bowel gas in the upper abdomen. No acute osseous abnormality  identified. IMPRESSION: Lower lung volumes with acute on chronic pulmonary interstitial opacity. Consider viral/atypical respiratory infection and pulmonary interstitial edema. Electronically Signed   By: Genevie Ann M.D.   On: 05/08/2019 20:07      Allergies as of 05/10/2019      Reactions   Pletal [cilostazol] Swelling   Erythromycin Hives  Codeine Nausea Only, Nausea And Vomiting   Erythromycin Base Itching, Rash   Penicillins Hives, Itching, Rash   Sulfa Antibiotics Rash      Medication List    STOP taking these medications   cephALEXin 500 MG capsule Commonly known as:  KEFLEX     TAKE these medications   albuterol 108 (90 Base) MCG/ACT inhaler Commonly known as:  VENTOLIN HFA TAKE 2 PUFFS BY MOUTH EVERY 6 HOURS AS NEEDED FOR WHEEZE OR SHORTNESS OF BREATH   allopurinol 100 MG tablet Commonly known as:  ZYLOPRIM Take 100 mg by mouth 2 (two) times daily. Notes to patient:  Take this morning   amLODipine 10 MG tablet Commonly known as:  NORVASC TAKE 1 TABLET EVERY DAY Notes to patient:  Take this morning   aspirin EC 81 MG tablet Take 81 mg by mouth daily. Notes to patient:  Take this morning   cefdinir 300 MG capsule Commonly known as:  OMNICEF Take 1 capsule (300 mg total) by mouth daily for 3 days. Notes to patient:  Take today   CLARITIN PO Take by mouth. Notes to patient:  Take today   colchicine 0.6 MG tablet Take 0.6 mg by mouth as needed.   furosemide 40 MG tablet Commonly known as:  LASIX Take 40 mg every other day by mouth. Notes to patient:  Take tomorrow   hydrocortisone valerate cream 0.2 % Commonly known as:  WESTCORT Apply 1 application topically as needed.   ICAPS AREDS 2 PO Take 1 tablet by mouth daily. Notes to patient:  Take today   losartan-hydrochlorothiazide 50-12.5 MG tablet Commonly known as:  HYZAAR TAKE 1 TABLET EVERY DAY Notes to patient:  Take today   pantoprazole 20 MG tablet Commonly known as:  PROTONIX Take 2 caps.  Daily in am. Notes to patient:  Take today   pravastatin 20 MG tablet Commonly known as:  PRAVACHOL TAKE 1 TABLET AT BEDTIME Notes to patient:  Take tonight   TYLENOL ARTHRITIS PAIN PO Take by mouth as needed.   VITAMIN D PO Take by mouth. Notes to patient:  Take today         Management plans discussed with the patient and she is in agreement. Stable for discharge   Patient should follow up with cardiology  CODE STATUS:     Code Status Orders  (From admission, onward)         Start     Ordered   05/08/19 2357  Full code  Continuous     05/09/19 0002        Code Status History    Date Active Date Inactive Code Status Order ID Comments User Context   10/16/2017 1237 10/20/2017 1859 Full Code [id number]  Gladstone Lighter, MD Inpatient      TOTAL TIME TAKING CARE OF THIS PATIENT: 39 minutes.    Note: This dictation was prepared with Dragon dictation along with smaller phrase technology. Any transcriptional errors that result from this process are unintentional.  Bettey Costa M.D on 05/10/2019 at 10:52 AM  Between 7am to 6pm - Pager - [phone] After 6pm go to [email or web address] - password [email or web address] [facility]  Office  [phone]  CC: Primary care physician; Chrismon, Vickki Muff, PA

## 2019-05-11 ENCOUNTER — Telehealth: Payer: Self-pay

## 2019-05-11 NOTE — Telephone Encounter (Signed)
Gave [employer] orders for oxygen. Put copy in scan [facility]

## 2019-05-11 NOTE — Telephone Encounter (Signed)
TELEPHONE CALL NOTE  Lori Johnson has been deemed a candidate for a follow-up tele-health visit to limit community exposure during the Covid-19 pandemic. I spoke with the patient via phone to ensure availability of phone/video source, confirm preferred email & phone number, discuss instructions and expectations, and review consent.   I reminded Lori Johnson to be prepared with any vital sign and/or heart rhythm information that could potentially be obtained via home monitoring, at the time of her visit.  Finally, I reminded Lori Johnson to expect an e-mail containing a link for their video-based visit approximately 15 minutes before her visit, or alternatively, a phone call at the time of her visit if her visit is planned to be a phone encounter.  Did the patient verbally consent to treatment as below? YES   Gaylord Shih, CMA 05/11/2019 3:09 PM  CONSENT FOR TELE-HEALTH VISIT - PLEASE REVIEW  I hereby voluntarily request, consent and authorize [facility] and its employed or contracted physicians, physician assistants, nurse practitioners or other licensed health care professionals (the Practitioner), to provide me with telemedicine health care services (the "Services") as deemed necessary by the treating Practitioner. I acknowledge and consent to receive the Services by the Practitioner via telemedicine. I understand that the telemedicine visit will involve communicating with the Practitioner through telephonic communication technology and the disclosure of certain medical information by electronic transmission. I acknowledge that I have been given the opportunity to request an in-person assessment or other available alternative prior to the telemedicine visit and am voluntarily participating in the telemedicine visit.  I understand that I have the right to withhold or withdraw my consent to the use of telemedicine in the course of my care at any time, without affecting my right to  future care or treatment, and that the Practitioner or I may terminate the telemedicine visit at any time. I understand that I have the right to inspect all information obtained and/or recorded in the course of the telemedicine visit and may receive copies of available information for a reasonable fee.  I understand that some of the potential risks of receiving the Services via telemedicine include:  [name] Delay or interruption in medical evaluation due to technological equipment failure or disruption; . Information transmitted may not be sufficient (e.g. poor resolution of images) to allow for appropriate medical decision making by the Practitioner; and/or  . In rare instances, security protocols could fail, causing a breach of personal health information.  Furthermore, I acknowledge that it is my responsibility to provide information about my medical history, conditions and care that is complete and accurate to the best of my ability. I acknowledge that Practitioner's advice, recommendations, and/or decision may be based on factors not within their control, such as incomplete or inaccurate data provided by me or lack of visual representation. I understand that the practice of medicine is not an exact science and that Practitioner makes no warranties or guarantees regarding treatment outcomes. I acknowledge that I will receive a copy of this consent concurrently upon execution via email to the email address I last provided but may also request a printed copy by calling the office of [facility].    I understand that my insurance may be billed for this visit.   I have read or had this consent read to me. . I understand the contents of this consent, which adequately explains the benefits and risks of the Services being provided via telemedicine.  . I have  been provided ample opportunity to ask questions regarding this consent and the Services and have had my questions answered to my  satisfaction. . I give my informed consent for the services to be provided through the use of telemedicine in my medical care  By participating in this telemedicine visit I agree to the above.

## 2019-05-11 NOTE — Telephone Encounter (Signed)
No HFU scheduled.  

## 2019-05-11 NOTE — Telephone Encounter (Signed)
Acknowledged.

## 2019-05-11 NOTE — Telephone Encounter (Signed)
Transition Care Management Follow-up Telephone Call  Date of discharge and from where: [facility] [address] on 05/10/19.  How have you been since you were released from the hospital? Doing much better. Still coughing but not as bad as before. Currently seeing clear sputum. Declines fever, SOB, respiratory distress, fluid retention or n/v/d. No longer on oxygen.   Any questions or concerns? No   Items Reviewed:  Did the pt receive and understand the discharge instructions provided? Yes   Medications obtained and verified? Daughter manages medications and is not present currently. Pt wishes to review these at the f/u apt.  Any new allergies since your discharge? No   Dietary orders reviewed? Yes  Do you have support at home? Yes   Other (ie: DME, Home Health, etc) N/A  Functional Questionnaire: (I = Independent and D = Dependent)  Bathing/Dressing- I   Meal Prep- I  Eating- I  Maintaining continence- I  Transferring/Ambulation- I, uses a walker as needed.   Managing Meds- D, daughter manages meds.   Follow up appointments reviewed:    PCP Hospital f/u appt confirmed? No  pt to have daughter to call and schedule HFU.  [facility] f/u appt confirmed? Yes    Are transportation arrangements needed? No   If their condition worsens, is the pt aware to call  their PCP or go to the ED? Yes  Was the patient provided with contact information for the PCP's office or ED? Yes  Was the pt encouraged to call back with questions or concerns? Yes

## 2019-05-11 NOTE — Telephone Encounter (Signed)
   TELEPHONE CALL NOTE  This patient has been deemed a candidate for follow-up tele-health visit to limit community exposure during the Covid-19 pandemic. I spoke with the patient via phone to discuss instructions. The patient was advised to review the section on consent for treatment as well. The patient will receive a phone call 2-3 days prior to their E-Visit at which time consent will be verbally confirmed. A Virtual Office Visit appointment type has been scheduled for 05/15/2019 with Darylene Price FNP.  Gaylord Shih, CMA 05/11/2019 3:09 PM

## 2019-05-13 LAB — CULTURE, BLOOD (ROUTINE X 2)
Culture: NO GROWTH
Culture: NO GROWTH

## 2019-05-15 ENCOUNTER — Encounter: Payer: Self-pay | Admitting: Family

## 2019-05-15 ENCOUNTER — Ambulatory Visit: Payer: Medicare PPO | Attending: Family | Admitting: Family

## 2019-05-15 ENCOUNTER — Other Ambulatory Visit: Payer: Self-pay

## 2019-05-15 VITALS — BP 140/60 | Wt 174.0 lb

## 2019-05-15 DIAGNOSIS — I10 Essential (primary) hypertension: Secondary | ICD-10-CM

## 2019-05-15 DIAGNOSIS — I2510 Atherosclerotic heart disease of native coronary artery without angina pectoris: Secondary | ICD-10-CM | POA: Diagnosis not present

## 2019-05-15 DIAGNOSIS — I5032 Chronic diastolic (congestive) heart failure: Secondary | ICD-10-CM

## 2019-05-15 DIAGNOSIS — I350 Nonrheumatic aortic (valve) stenosis: Secondary | ICD-10-CM | POA: Diagnosis not present

## 2019-05-15 DIAGNOSIS — J45909 Unspecified asthma, uncomplicated: Secondary | ICD-10-CM | POA: Diagnosis not present

## 2019-05-15 DIAGNOSIS — R7989 Other specified abnormal findings of blood chemistry: Secondary | ICD-10-CM | POA: Diagnosis not present

## 2019-05-15 DIAGNOSIS — D649 Anemia, unspecified: Secondary | ICD-10-CM | POA: Diagnosis not present

## 2019-05-15 DIAGNOSIS — E782 Mixed hyperlipidemia: Secondary | ICD-10-CM | POA: Diagnosis not present

## 2019-05-15 DIAGNOSIS — Z8719 Personal history of other diseases of the digestive system: Secondary | ICD-10-CM | POA: Diagnosis not present

## 2019-05-15 DIAGNOSIS — I739 Peripheral vascular disease, unspecified: Secondary | ICD-10-CM | POA: Diagnosis not present

## 2019-05-15 NOTE — Progress Notes (Signed)
Evaluation Performed:  Initial visit  This visit type was conducted due to national recommendations for restrictions regarding the COVID-19 Pandemic (e.g. social distancing).  This format is felt to be most appropriate for this patient at this time.  All issues noted in this document were discussed and addressed.  No physical exam was performed (except for noted visual exam findings with Video Visits).  Please refer to the patient's chart (MyChart message for video visits and phone note for telephone visits) for the patient's consent to telehealth for [facility]  Date:  05/15/2019   ID:  Lori Johnson, DOB March 13, 1930, MRN [id number]  Patient Location:  [place] [address]   Provider location:   [facility] [place] [address], [place] [address]  PCP:  Margo Common, [place]  Cardiologist: Lujean Amel, MD Electrophysiologist:  None   Chief Complaint:  Shortness of breath  History of Present Illness:    Lori Johnson is a 85 y.o. female who presents via audio/video conferencing for a telehealth visit today.  Patient verified DOB and address.  The patient does not have symptoms concerning for COVID-19 infection (fever, chills, cough, or new SHORTNESS OF BREATH).   Patient reports moderate shortness of breath upon minimal exertion. She describes this as chronic in nature having been present for several years. She has associated dry cough, fatigue and difficulty sleeping along with this. She denies dizziness, swelling in her legs/ abdomen, palpitations, chest pain or weight gain.   Prior CV studies:   The following studies were reviewed today:  Echo report from 05/09/2019 reviewed and showed an EF of 50-55% along with moderate/ severe AS.   Past Medical History:  Diagnosis Date  . Angiopathy, peripheral ([place])   . Arterial embolus and thrombosis ([place])   . ASCVD (arteriosclerotic cardiovascular disease)    with stent RCA  .  Bilateral bunions   . Cancer ([place])    SCC removed off left leg  . Cervical lymphadenitis   . Chronic gout of left ankle due to renal impairment without tophus   . Chronic infection June 2014   Has chronic infection of right femoral graft with history of extensive debridement in June 2014. Was on oral Clindamycin for 3 weeks for culture positive for anerobes and staph lugdunensis. Vascular Surgeon at [facility] Remonia Richter, MD) recommended indefinite suppressive therapy with Cephalexin 500 mg qd and reassessment every 3 months. Will refill Cephalexin.)  . Chronic kidney disease   . COPD (chronic obstructive pulmonary disease) ([place])   . CRF (chronic renal failure)   . GERD (gastroesophageal reflux disease)   . Hiatal hernia   . History of arterial bypass of lower extremity   . Hyperlipidemia   . Hypertension   . Hyperuricemia   . Macular degeneration   . Mass of right side of neck   . Nocturia   . OA (osteoarthritis)   . PVD (peripheral vascular disease) ([place])   . Renal disorder    Past Surgical History:  Procedure Laterality Date  . ABDOMINAL HYSTERECTOMY  1981  . arterial bypass right leg Right   . broken left foot Left 01/2002  . CHOLECYSTECTOMY    . CORONARY ANGIOPLASTY WITH STENT PLACEMENT    . stent in leg       Current Meds  Medication Sig  . Acetaminophen (TYLENOL ARTHRITIS PAIN PO) Take by mouth as needed.   [name] albuterol (VENTOLIN HFA) 108 (90 Base) MCG/ACT inhaler TAKE 2  PUFFS BY MOUTH EVERY 6 HOURS AS NEEDED FOR WHEEZE OR SHORTNESS OF BREATH  . allopurinol (ZYLOPRIM) 100 MG tablet Take 100 mg by mouth 2 (two) times daily.   [name] amLODipine (NORVASC) 10 MG tablet TAKE 1 TABLET EVERY DAY  . aspirin EC 81 MG tablet Take 81 mg by mouth daily.  . cephALEXin (KEFLEX) 500 MG capsule Take 500 mg by mouth daily.  . furosemide (LASIX) 40 MG tablet Take 40 mg every other day by mouth.   . Loratadine (CLARITIN PO) Take 10 mg by mouth daily.   [name] losartan-hydrochlorothiazide (HYZAAR)  50-12.5 MG tablet TAKE 1 TABLET EVERY DAY  . Multiple Vitamins-Minerals (ICAPS AREDS 2 PO) Take 1 tablet by mouth daily.  . pantoprazole (PROTONIX) 20 MG tablet Take 2 caps. Daily in am. (Patient taking differently: Take by mouth. Take 2 caps. Daily in am.)  . pravastatin (PRAVACHOL) 20 MG tablet TAKE 1 TABLET AT BEDTIME  . vitamin B-12 (CYANOCOBALAMIN) 100 MCG tablet Take 100 mcg by mouth daily.  [name] VITAMIN D PO Take by mouth.     Allergies:   Pletal [cilostazol], Erythromycin, Codeine, Erythromycin base, Penicillins, and Sulfa antibiotics   Social History   Tobacco Use  . Smoking status: Former Smoker    Types: Cigarettes  . Smokeless tobacco: Never Used  . Tobacco comment: quit >30-40 years ago  Substance Use Topics  . Alcohol use: No  . Drug use: No     Family Hx: The patient's family history includes Heart disease in her father and mother.  ROS:   Please see the history of present illness.     All other systems reviewed and are negative.   Labs/Other Tests and Data Reviewed:    Recent Labs: 05/08/2019: ALT 15; B Natriuretic Peptide 1,949.0 05/09/2019: Hemoglobin 8.8; Platelets 141 05/10/2019: BUN 55; Creatinine, Ser 2.38; Potassium 3.9; Sodium 136   Recent Lipid Panel Lab Results  Component Value Date/Time   CHOL 176 12/06/2015 11:06 AM   CHOL 149 10/28/2013 04:35 AM   TRIG 191 (H) 12/06/2015 11:06 AM   TRIG 52 10/28/2013 04:35 AM   HDL 36 (L) 12/06/2015 11:06 AM   HDL 44 10/28/2013 04:35 AM   CHOLHDL 4.9 (H) 12/06/2015 11:06 AM   LDLCALC 102 (H) 12/06/2015 11:06 AM   LDLCALC 95 10/28/2013 04:35 AM    Wt Readings from Last 3 Encounters:  05/15/19 174 lb (78.9 kg)  05/09/19 168 lb 6.9 oz (76.4 kg)  05/08/19 175 lb (79.4 kg)     Exam:    Vital Signs:  BP 140/60 Comment: self-reported  Wt 174 lb (78.9 kg) Comment: self-reported  BMI 31.83 kg/m    Well nourished, well developed female in no  acute distress.   ASSESSMENT & PLAN:    1. Chronic heart  failure with preserved ejection fraction- - NYHA class III - euvolemic per patient's description of symptoms - weighing daily and says that her weight has been stable; instructed to call for an overnight weight gain of >2 pounds or a weekly weight gain of >5 pounds - not adding salt and her daughter says that she's been reading food labels for sodium content - saw cardiology Clayborn Bigness) 05/16/18 - saw pulmonology Humphrey Rolls) 05/08/2019 - BNP 05/08/2019 was 1949.0  2: HTN- - self-reported BP was good - saw PCP (Chrismon) 01/26/2019 - BMP from 05/10/2019 reviewed and showed sodium 136, potassium 3.9, creatinine 2.38 and GFR 18  COVID-19 Education: The signs and symptoms of COVID-19 were discussed with the  patient and how to seek care for testing (follow up with PCP or arrange E-visit).  The importance of social distancing was discussed today.  Patient Risk:   After full review of this patients clinical status, I feel that they are at least moderate risk at this time.  Time:   Today, I have spent 13 minutes with the patient with telehealth technology discussing medications, diet and weight.     Medication Adjustments/Labs and Tests Ordered: Current medicines are reviewed at length with the patient today.  Concerns regarding medicines are outlined above.   Tests Ordered: No orders of the defined types were placed in this encounter.  Medication Changes: No orders of the defined types were placed in this encounter.   Disposition:  Patient and daughter opt to not make a return appointment at this time. Advised patient to call back if needed.   Signed, Alisa Graff, FNP  05/15/2019 1:15 PM    [facility]

## 2019-05-15 NOTE — Patient Instructions (Signed)
Continue weighing daily and call for an overnight weight gain of > 2 pounds or a weekly weight gain of >5 pounds. 

## 2019-05-16 ENCOUNTER — Ambulatory Visit (INDEPENDENT_AMBULATORY_CARE_PROVIDER_SITE_OTHER): Payer: Medicare PPO | Admitting: Family Medicine

## 2019-05-16 ENCOUNTER — Other Ambulatory Visit: Payer: Self-pay

## 2019-05-16 VITALS — BP 136/52 | HR 86 | Temp 97.9°F | Wt 178.0 lb

## 2019-05-16 DIAGNOSIS — E7800 Pure hypercholesterolemia, unspecified: Secondary | ICD-10-CM

## 2019-05-16 DIAGNOSIS — I509 Heart failure, unspecified: Secondary | ICD-10-CM | POA: Diagnosis not present

## 2019-05-16 DIAGNOSIS — I739 Peripheral vascular disease, unspecified: Secondary | ICD-10-CM

## 2019-05-16 DIAGNOSIS — H3530 Unspecified macular degeneration: Secondary | ICD-10-CM | POA: Diagnosis not present

## 2019-05-16 DIAGNOSIS — D649 Anemia, unspecified: Secondary | ICD-10-CM

## 2019-05-16 DIAGNOSIS — I10 Essential (primary) hypertension: Secondary | ICD-10-CM | POA: Diagnosis not present

## 2019-05-16 DIAGNOSIS — J9601 Acute respiratory failure with hypoxia: Secondary | ICD-10-CM

## 2019-05-16 DIAGNOSIS — J189 Pneumonia, unspecified organism: Secondary | ICD-10-CM

## 2019-05-16 NOTE — Progress Notes (Signed)
Lori Johnson  MRN: [id number] DOB: 05/09/1930  Subjective:  HPI   The patient is an 85 year old female who presents for follow up after recent hospitalization for shortness of breath, acute pulmonary edema and respiratory distress.  Patient had COVID 19 testing while in hospital alone with MRSA screening, both were negative.  Patient was put on Cefdinir after being discharged from the hospital.  She states that this medicine made her feel worse than when she left the hospital.  She has since finished the medicine and still feels bad.  She gets very short of breath with increased coughing and anxiety with any activity.   Her daughter reports that the pulmonologist has ordered O2 for her to have at home for when she has any activity, they are in the process of getting it out to her.   Medication list at the time of discharge  Medication List    STOP taking these medications   cephALEXin 500 MG capsule Commonly known as:  KEFLEX     TAKE these medications   albuterol 108 (90 Base) MCG/ACT inhaler Commonly known as:  VENTOLIN HFA TAKE 2 PUFFS BY MOUTH EVERY 6 HOURS AS NEEDED FOR WHEEZE OR SHORTNESS OF BREATH   allopurinol 100 MG tablet Commonly known as:  ZYLOPRIM Take 100 mg by mouth 2 (two) times daily. Notes to patient:  Take this morning   amLODipine 10 MG tablet Commonly known as:  NORVASC TAKE 1 TABLET EVERY DAY Notes to patient:  Take this morning   aspirin EC 81 MG tablet Take 81 mg by mouth daily. Notes to patient:  Take this morning   cefdinir 300 MG capsule Commonly known as:  OMNICEF Take 1 capsule (300 mg total) by mouth daily for 3 days. Notes to patient:  Take today   CLARITIN PO Take by mouth. Notes to patient:  Take today   colchicine 0.6 MG tablet Take 0.6 mg by mouth as needed.   furosemide 40 MG tablet Commonly known as:  LASIX Take 40 mg every other day by mouth. Notes to patient:  Take tomorrow   hydrocortisone valerate cream 0.2 %  Commonly known as:  WESTCORT Apply 1 application topically as needed.   ICAPS AREDS 2 PO Take 1 tablet by mouth daily. Notes to patient:  Take today   losartan-hydrochlorothiazide 50-12.5 MG tablet Commonly known as:  HYZAAR TAKE 1 TABLET EVERY DAY Notes to patient:  Take today   pantoprazole 20 MG tablet Commonly known as:  PROTONIX Take 2 caps. Daily in am. Notes to patient:  Take today   pravastatin 20 MG tablet Commonly known as:  PRAVACHOL TAKE 1 TABLET AT BEDTIME Notes to patient:  Take tonight   TYLENOL ARTHRITIS PAIN PO Take by mouth as needed.   VITAMIN D PO Take by mouth. Notes to patient:  Take today   Lab Results  Component Value Date   WBC 9.6 05/09/2019   HGB 8.8 (L) 05/09/2019   HCT 26.6 (L) 05/09/2019   MCV 93.7 05/09/2019   PLT 141 (L) 05/09/2019   Lab Results  Component Value Date   CREATININE 2.38 (H) 05/10/2019   BUN 55 (H) 05/10/2019   NA 136 05/10/2019   K 3.9 05/10/2019   CL 98 05/10/2019   CO2 25 05/10/2019   Lab Results  Component Value Date   CKTOTAL 114 10/28/2013   CKMB 4.0 (H) 10/27/2013   TROPONINI 0.62 ([place]) 05/09/2019   Patient had follow up via telemedicine with  Darylene Price, FNP on 05/15/19.  Patient was last seen by her cardiologist Dr Clayborn Bigness on 05/16/18, and Pulmonology, Dr Chancy Milroy on 05/08/19.  Patient Active Problem List   Diagnosis Date Noted  . Acute CHF (congestive heart failure) ([place]) 05/09/2019  . Acute respiratory failure ([place]) 05/08/2019  . GI bleed 10/16/2017  . Closed fracture of tibial plateau 06/24/2015  . Current tear knee, medial meniscus 06/24/2015  . Arthritis of knee, degenerative 06/24/2015  . Angiopathy, peripheral ([place]) 06/06/2015  . Arterial vascular disease 06/06/2015  . Bunion 06/06/2015  . Cervical adenitis 06/06/2015  . Gout due to renal impairment of multiple sites 06/06/2015  . Chronic infection 06/06/2015  . Contusion of forearm 06/06/2015  . Arm bruise 06/06/2015  . Asthma, cough  variant 06/06/2015  . Gastroesophageal reflux disease with hiatal hernia 06/06/2015  . History of DVT of lower extremity 06/06/2015  . Bergmann's syndrome 06/06/2015  . H/O arterial bypass of lower limb 06/06/2015  . HLD (hyperlipidemia) 06/06/2015  . Benign hypertension 06/06/2015  . Elevated blood uric acid level 06/06/2015  . Disorder of kidney 06/06/2015  . Lump in neck 06/06/2015  . Excessive urination at night 06/06/2015  . Arthritis, degenerative 06/06/2015  . Poor balance 06/06/2015  . Peripheral blood vessel disorder ([place]) 06/06/2015  . Acid reflux 03/08/2013  . Atherosclerosis of native artery of extremity ([place]) 01/22/2013  . Chronic kidney disease 01/22/2013  . CAD in native artery 01/22/2013  . Essential (primary) hypertension 01/22/2013  . Atherosclerosis of native arteries of extremity with rest pain ([place]) 01/22/2013  . Arthralgia of hip or thigh 03/29/2006    Past Medical History:  Diagnosis Date  . Angiopathy, peripheral ([place])   . Arterial embolus and thrombosis ([place])   . ASCVD (arteriosclerotic cardiovascular disease)    with stent RCA  . Bilateral bunions   . Cancer ([place])    SCC removed off left leg  . Cervical lymphadenitis   . Chronic gout of left ankle due to renal impairment without tophus   . Chronic infection June 2014   Has chronic infection of right femoral graft with history of extensive debridement in June 2014. Was on oral Clindamycin for 3 weeks for culture positive for anerobes and staph lugdunensis. Vascular Surgeon at [facility] Remonia Richter, MD) recommended indefinite suppressive therapy with Cephalexin 500 mg qd and reassessment every 3 months. Will refill Cephalexin.)  . Chronic kidney disease   . COPD (chronic obstructive pulmonary disease) ([place])   . CRF (chronic renal failure)   . GERD (gastroesophageal reflux disease)   . Hiatal hernia   . History of arterial bypass of lower extremity   . Hyperlipidemia   . Hypertension   . Hyperuricemia    . Macular degeneration   . Mass of right side of neck   . Nocturia   . OA (osteoarthritis)   . PVD (peripheral vascular disease) ([place])   . Renal disorder     Social History   Socioeconomic History  . Marital status: Widowed    Spouse name: Not on file  . Number of children: 3  . Years of education: Not on file  . Highest education level: 9th grade  Occupational History  . Occupation: retired  Scientific laboratory technician  . Financial resource strain: Not hard at all  . Food insecurity    Worry: Never true    Inability: Never true  . Transportation needs    Medical: No    Non-medical: No  Tobacco Use  . Smoking status: Former  Smoker    Types: Cigarettes  . Smokeless tobacco: Never Used  . Tobacco comment: quit >30-40 years ago  Substance and Sexual Activity  . Alcohol use: No  . Drug use: No  . Sexual activity: Not on file  Lifestyle  . Physical activity    Days per week: 0 days    Minutes per session: 0 min  . Stress: Not at all  Relationships  . Social Herbalist on phone: Patient refused    Gets together: Patient refused    Attends religious service: Patient refused    Active member of club or organization: Patient refused    Attends meetings of clubs or organizations: Patient refused    Relationship status: Patient refused  . Intimate partner violence    Fear of current or ex partner: Patient refused    Emotionally abused: Patient refused    Physically abused: Patient refused    Forced sexual activity: Patient refused  Other Topics Concern  . Not on file  Social History Narrative   Lives with daughter. Independent at baseline. Uses walker only for outside    Outpatient Encounter Medications as of 05/16/2019  Medication Sig  . Acetaminophen (TYLENOL ARTHRITIS PAIN PO) Take by mouth as needed.   [name] albuterol (VENTOLIN HFA) 108 (90 Base) MCG/ACT inhaler TAKE 2 PUFFS BY MOUTH EVERY 6 HOURS AS NEEDED FOR WHEEZE OR SHORTNESS OF BREATH  . allopurinol (ZYLOPRIM) 100  MG tablet Take 100 mg by mouth 2 (two) times daily.   [name] amLODipine (NORVASC) 10 MG tablet TAKE 1 TABLET EVERY DAY  . aspirin EC 81 MG tablet Take 81 mg by mouth daily.  . cephALEXin (KEFLEX) 500 MG capsule Take 500 mg by mouth daily.  . colchicine 0.6 MG tablet Take 0.6 mg by mouth as needed.   . furosemide (LASIX) 40 MG tablet Take 40 mg every other day by mouth.   . hydrocortisone valerate cream (WESTCORT) 0.2 % Apply 1 application topically as needed.   . Loratadine (CLARITIN PO) Take 10 mg by mouth daily.   [name] losartan-hydrochlorothiazide (HYZAAR) 50-12.5 MG tablet TAKE 1 TABLET EVERY DAY  . Multiple Vitamins-Minerals (ICAPS AREDS 2 PO) Take 1 tablet by mouth daily.  . pantoprazole (PROTONIX) 20 MG tablet Take 2 caps. Daily in am. (Patient taking differently: Take by mouth. Take 2 caps. Daily in am.)  . pravastatin (PRAVACHOL) 20 MG tablet TAKE 1 TABLET AT BEDTIME  . vitamin B-12 (CYANOCOBALAMIN) 100 MCG tablet Take 100 mcg by mouth daily.  [name] VITAMIN D PO Take by mouth.   No facility-administered encounter medications on file as of 05/16/2019.     Allergies  Allergen Reactions  . Pletal [Cilostazol] Swelling  . Erythromycin Hives  . Codeine Nausea Only and Nausea And Vomiting  . Erythromycin Base Itching and Rash  . Penicillins Hives, Itching and Rash  . Sulfa Antibiotics Rash    Review of Systems  Constitutional: Positive for chills and malaise/fatigue. Negative for diaphoresis and fever.  HENT: Negative for congestion, ear discharge, ear pain, nosebleeds, sinus pain and sore throat.   Respiratory: Positive for cough, shortness of breath and wheezing. Negative for sputum production.   Cardiovascular: Positive for leg swelling. Negative for chest pain and palpitations.  Gastrointestinal: Negative for abdominal pain, blood in stool, constipation, diarrhea, heartburn, melena, nausea and vomiting.    Objective:  BP (!) 136/52 (BP Location: Right Arm, Patient Position: Sitting, Cuff  Size: Normal)   Pulse 86   Temp  97.9 F (36.6 C) (Oral)   Wt 178 lb (80.7 kg)   SpO2 96%   BMI 32.56 kg/m   Physical Exam  Constitutional: She is oriented to person, place, and time and well-developed, well-nourished, and in no distress.  Well developed but fatigued.  HENT:  Head: Normocephalic.  Eyes: Conjunctivae are normal.  Very poor vision with diagnosis of macular degeneration.  Neck: Neck supple.  Pulmonary/Chest: Effort normal. She has wheezes. She has rales.  Fewer crackles in the right posterolateral base. Slight wheezing scattered.  Abdominal: Soft. Bowel sounds are normal.  Musculoskeletal: Normal range of motion.  Neurological: She is alert and oriented to person, place, and time.  Slow gait and poor balance. Ambulates fairly well with a rolling walker and family assistance.  Skin: No rash noted.  1.5 cm diameter scab on the right lower shin. No drainage or significant erythema. No pitting edema of lower legs and ankles. Pulses faint but symmetric. Not cold or cyanotic.  Psychiatric: Mood, affect and judgment normal.    Assessment and Plan :  1. Acute congestive heart failure, unspecified heart failure type ([place]) Hospitalized from 05-08-19 to 05-10-19 for worsening shortness of breath with final diagnosis of acute respiratory failure and acute CHF. Echocardiogram during admission showed EF 50-55%. No significant edema today. Pulse oximetry 96% today and uses oxygen prn at home and at night. Had elevation of troponin on admission, probably due to demand ischemia without ACS. Presently on Lasix 40 mg QOD and followed by CHF clinic. - CBC with Differential/Platelet - Comprehensive metabolic panel  2. Acute respiratory failure with hypoxia [facility]) Hospitalized for worsening dyspnea after follow up visit with pulmonologist (Dr. Chancy Milroy) on 05-08-19. Progressed to some abdominal pain by the afternoon and presented to the ER with oxygen desaturation into the 80's and was placed on  BiPAP. Prior to discharge she was weaned off BiPAP and has oxygen at home for prn use. Uses Albuterol prn wheeze. Should maintain follow up with pulmonologist. Recheck as needed. Will get CBC and CMP to assess progress. - CBC with Differential/Platelet  3. Community acquired pneumonia, unspecified laterality Blood cultures were negative and COVID-19 test negative during hospitalization. CXR on admission showed acute on chronic pulmonary interstitial opacity suspected to be atypical CAP. Discharged on Cefdinir 300 mg qd. Recheck CBC with diff. Few rales remain today. Recheck pending reports. - CBC with Differential/Platelet  4. Anemia, unspecified type Energy still low. Hgb was 8.8 with Hct 26.6 on 05-09-19 during hospitalization. Will recheck CBC and may need iron supplement or follow up with hematology or GI for chronic anemia. History of Stage 4 CKD, also. - CBC with Differential/Platelet  5. Peripheral blood vessel disorder ([facility]) History of arterial bypass of lower extremity on the right femoral to peroneal artery with PTFE and vein cuff April 2014. Had infection of the bypass and has been on Keflex daily to prevent recurrence of infection. No longer on anticoagulant (Coumadin) due to past GI bleeds.   6. Pure hypercholesterolemia Tolerating Pravastatin 20 mg qd. Energy level low with CHF and recent CAP. Not eating well. Will recheck CMP. - Comprehensive metabolic panel  7. Benign hypertension BP well controlled. Continues Hyzaar 50-12.5 mg qd with Amlodipine 10 mg qd. Recheck labs and continue follow up with cardiologist regularly (Dr. Clayborn Bigness). - CBC with Differential/Platelet - Comprehensive metabolic panel  8. Macular degeneration, unspecified laterality, unspecified type Followed by ophthalmologist regularly. Still taking AREDS-2 one tablet daily. Vision very poor in dim lighting. Unable to read (  family reads to her) and can only see TV in daytime. Ambulates fairly well with a walker.

## 2019-05-17 DIAGNOSIS — J449 Chronic obstructive pulmonary disease, unspecified: Secondary | ICD-10-CM | POA: Diagnosis not present

## 2019-05-17 LAB — COMPREHENSIVE METABOLIC PANEL
ALT: 10 IU/L (ref 0–32)
AST: 18 IU/L (ref 0–40)
Albumin/Globulin Ratio: 2 (ref 1.2–2.2)
Albumin: 4.2 g/dL (ref 3.6–4.6)
Alkaline Phosphatase: 70 IU/L (ref 39–117)
BUN/Creatinine Ratio: 23 (ref 12–28)
BUN: 60 mg/dL — ABNORMAL HIGH (ref 8–27)
Bilirubin Total: 0.7 mg/dL (ref 0.0–1.2)
CO2: 21 mmol/L (ref 20–29)
Calcium: 9.9 mg/dL (ref 8.7–10.3)
Chloride: 93 mmol/L — ABNORMAL LOW (ref 96–106)
Creatinine, Ser: 2.58 mg/dL — ABNORMAL HIGH (ref 0.57–1.00)
GFR calc Af Amer: 19 mL/min/{1.73_m2} — ABNORMAL LOW (ref >59)
GFR calc non Af Amer: 16 mL/min/{1.73_m2} — ABNORMAL LOW (ref >59)
Globulin, Total: 2.1 g/dL (ref 1.5–4.5)
Glucose: 105 mg/dL — ABNORMAL HIGH (ref 65–99)
Potassium: 4.2 mmol/L (ref 3.5–5.2)
Sodium: 131 mmol/L — ABNORMAL LOW (ref 134–144)
Total Protein: 6.3 g/dL (ref 6.0–8.5)

## 2019-05-17 LAB — CBC WITH DIFFERENTIAL/PLATELET
Basophils Absolute: 0.1 10*3/uL (ref 0.0–0.2)
Basos: 1 %
EOS (ABSOLUTE): 0.3 10*3/uL (ref 0.0–0.4)
Eos: 5 %
Hematocrit: 27.3 % — ABNORMAL LOW (ref 34.0–46.6)
Hemoglobin: 9.2 g/dL — ABNORMAL LOW (ref 11.1–15.9)
Immature Grans (Abs): 0 10*3/uL (ref 0.0–0.1)
Immature Granulocytes: 0 %
Lymphocytes Absolute: 1 10*3/uL (ref 0.7–3.1)
Lymphs: 17 %
MCH: 31.3 pg (ref 26.6–33.0)
MCHC: 33.7 g/dL (ref 31.5–35.7)
MCV: 93 fL (ref 79–97)
Monocytes Absolute: 0.9 10*3/uL (ref 0.1–0.9)
Monocytes: 14 %
Neutrophils Absolute: 3.9 10*3/uL (ref 1.4–7.0)
Neutrophils: 63 %
Platelets: 195 10*3/uL (ref 150–450)
RBC: 2.94 x10E6/uL — ABNORMAL LOW (ref 3.77–5.28)
RDW: 12.9 % (ref 11.7–15.4)
WBC: 6.2 10*3/uL (ref 3.4–10.8)

## 2019-05-18 ENCOUNTER — Encounter: Payer: Self-pay | Admitting: Internal Medicine

## 2019-05-18 ENCOUNTER — Ambulatory Visit: Payer: Medicare PPO | Admitting: Internal Medicine

## 2019-05-18 ENCOUNTER — Other Ambulatory Visit: Payer: Self-pay

## 2019-05-18 ENCOUNTER — Telehealth: Payer: Self-pay

## 2019-05-18 VITALS — BP 122/68 | HR 68 | Resp 18 | Ht 59.0 in | Wt 175.0 lb

## 2019-05-18 DIAGNOSIS — K219 Gastro-esophageal reflux disease without esophagitis: Secondary | ICD-10-CM | POA: Diagnosis not present

## 2019-05-18 DIAGNOSIS — R0902 Hypoxemia: Secondary | ICD-10-CM

## 2019-05-18 DIAGNOSIS — N183 Chronic kidney disease, stage 3 unspecified: Secondary | ICD-10-CM

## 2019-05-18 DIAGNOSIS — I509 Heart failure, unspecified: Secondary | ICD-10-CM

## 2019-05-18 NOTE — Telephone Encounter (Signed)
Patients daughter [name] called requesting that [name] call something into the pharmacy for patient's rash and itchiness. She was seen in the office this week and was asked if she had any itching. Patient didn't understand the question and told [name] no. Patient has been having itching along with the rash. Please advise.  [facility] [employer] [facility]

## 2019-05-18 NOTE — Progress Notes (Signed)
[facility] [place], [place] [address]  Pulmonary Sleep Medicine   Office Visit Note  Patient Name: Lori Johnson DOB: 1930-08-03 MRN [id number]  Date of Service: 05/18/2019  Complaints/HPI: Pt reports last week she began to have abdominal pain.  She went to ER and on the way became SOB.  She was diagnosed with PNA.  She was diuresed, and and placed on antibiotics.  She was discharged 2 days later.  She is in office today on oxygen and doing well.  She still feels deconditioned from being in the hospital, but is improving a little each day.    ROS  General: (-) fever, (-) chills, (-) night sweats, (-) weakness Skin: (-) rashes, (-) itching,. Eyes: (-) visual changes, (-) redness, (-) itching. Nose and Sinuses: (-) nasal stuffiness or itchiness, (-) postnasal drip, (-) nosebleeds, (-) sinus trouble. Mouth and Throat: (-) sore throat, (-) hoarseness. Neck: (-) swollen glands, (-) enlarged thyroid, (-) neck pain. Respiratory: - cough, (-) bloody sputum, - shortness of breath, - wheezing. Cardiovascular: - ankle swelling, (-) chest pain. Lymphatic: (-) lymph node enlargement. Neurologic: (-) numbness, (-) tingling. Psychiatric: (-) anxiety, (-) depression   Current Medication: Outpatient Encounter Medications as of 05/18/2019  Medication Sig  . OXYGEN Inhale 2 L into the lungs.  . Acetaminophen (TYLENOL ARTHRITIS PAIN PO) Take by mouth as needed.   [name] albuterol (VENTOLIN HFA) 108 (90 Base) MCG/ACT inhaler TAKE 2 PUFFS BY MOUTH EVERY 6 HOURS AS NEEDED FOR WHEEZE OR SHORTNESS OF BREATH  . allopurinol (ZYLOPRIM) 100 MG tablet Take 100 mg by mouth 2 (two) times daily.   [name] amLODipine (NORVASC) 10 MG tablet TAKE 1 TABLET EVERY DAY  . aspirin EC 81 MG tablet Take 81 mg by mouth daily.  . cephALEXin (KEFLEX) 500 MG capsule Take 500 mg by mouth daily.  . colchicine 0.6 MG tablet Take 0.6 mg by mouth as needed.   . furosemide (LASIX) 40 MG tablet Take 40 mg every other  day by mouth.   . hydrocortisone valerate cream (WESTCORT) 0.2 % Apply 1 application topically as needed.   . Loratadine (CLARITIN PO) Take 10 mg by mouth daily.   [name] losartan-hydrochlorothiazide (HYZAAR) 50-12.5 MG tablet TAKE 1 TABLET EVERY DAY  . Multiple Vitamins-Minerals (ICAPS AREDS 2 PO) Take 1 tablet by mouth daily.  . pantoprazole (PROTONIX) 20 MG tablet Take 2 caps. Daily in am. (Patient taking differently: Take by mouth. Take 2 caps. Daily in am.)  . pravastatin (PRAVACHOL) 20 MG tablet TAKE 1 TABLET AT BEDTIME  . vitamin B-12 (CYANOCOBALAMIN) 100 MCG tablet Take 100 mcg by mouth daily.  [name] VITAMIN D PO Take by mouth.   No facility-administered encounter medications on file as of 05/18/2019.     Surgical History: Past Surgical History:  Procedure Laterality Date  . ABDOMINAL HYSTERECTOMY  1981  . arterial bypass right leg Right   . broken left foot Left 01/2002  . CHOLECYSTECTOMY    . CORONARY ANGIOPLASTY WITH STENT PLACEMENT    . stent in leg      Medical History: Past Medical History:  Diagnosis Date  . Angiopathy, peripheral ([place])   . Arterial embolus and thrombosis ([place])   . ASCVD (arteriosclerotic cardiovascular disease)    with stent RCA  . Bilateral bunions   . Cancer ([place])    SCC removed off left leg  . Cervical lymphadenitis   . Chronic gout of left ankle due to renal impairment without tophus   .  Chronic infection June 2014   Has chronic infection of right femoral graft with history of extensive debridement in June 2014. Was on oral Clindamycin for 3 weeks for culture positive for anerobes and staph lugdunensis. Vascular Surgeon at [facility] Remonia Richter, MD) recommended indefinite suppressive therapy with Cephalexin 500 mg qd and reassessment every 3 months. Will refill Cephalexin.)  . Chronic kidney disease   . COPD (chronic obstructive pulmonary disease) ([place])   . CRF (chronic renal failure)   . GERD (gastroesophageal reflux disease)   . Hiatal hernia   .  History of arterial bypass of lower extremity   . Hyperlipidemia   . Hypertension   . Hyperuricemia   . Macular degeneration   . Mass of right side of neck   . Nocturia   . OA (osteoarthritis)   . PVD (peripheral vascular disease) ([place])   . Renal disorder     Family History: Family History  Problem Relation Age of Onset  . Heart disease Mother   . Heart disease Father     Social History: Social History   Socioeconomic History  . Marital status: Widowed    Spouse name: Not on file  . Number of children: 3  . Years of education: Not on file  . Highest education level: 9th grade  Occupational History  . Occupation: retired  Scientific laboratory technician  . Financial resource strain: Not hard at all  . Food insecurity    Worry: Never true    Inability: Never true  . Transportation needs    Medical: No    Non-medical: No  Tobacco Use  . Smoking status: Former Smoker    Types: Cigarettes  . Smokeless tobacco: Never Used  . Tobacco comment: quit >30-40 years ago  Substance and Sexual Activity  . Alcohol use: No  . Drug use: No  . Sexual activity: Not on file  Lifestyle  . Physical activity    Days per week: 0 days    Minutes per session: 0 min  . Stress: Not at all  Relationships  . Social Herbalist on phone: Patient refused    Gets together: Patient refused    Attends religious service: Patient refused    Active member of club or organization: Patient refused    Attends meetings of clubs or organizations: Patient refused    Relationship status: Patient refused  . Intimate partner violence    Fear of current or ex partner: Patient refused    Emotionally abused: Patient refused    Physically abused: Patient refused    Forced sexual activity: Patient refused  Other Topics Concern  . Not on file  Social History Narrative   Lives with daughter. Independent at baseline. Uses walker only for outside    Vital Signs: Blood pressure 122/68, pulse 68, resp. rate 18,  height 4\' 11"  (1.499 m), weight 175 lb (79.4 kg), SpO2 96 %.  Examination: General Appearance: The patient is well-developed, well-nourished, and in no distress. Skin: Gross inspection of skin unremarkable. Head: normocephalic, no gross deformities. Eyes: no gross deformities noted. ENT: ears appear grossly normal no exudates. Neck: Supple. No thyromegaly. No LAD. Respiratory: course breath sounds scattered Ronchi. Cardiovascular: Normal S1 and S2 without murmur or rub. Extremities: No cyanosis. pulses are equal. Neurologic: Alert and oriented. No involuntary movements.  LABS: Recent Results (from the past 2160 hour(s))  Pulmonary function test     Status: None   Collection Time: 04/05/19 10:00 AM  Result Value Ref Range  FEV1     FVC     FEV1/FVC     TLC     DLCO    Lactic acid, plasma     Status: None   Collection Time: 05/08/19  7:58 PM  Result Value Ref Range   Lactic Acid, Venous 1.5 0.5 - 1.9 mmol/L    Comment: Performed at [facility], [address] [place], [place] [address]  Comprehensive metabolic panel     Status: Abnormal   Collection Time: 05/08/19  7:58 PM  Result Value Ref Range   Sodium 133 (L) 135 - 145 mmol/L   Potassium 4.1 3.5 - 5.1 mmol/L   Chloride 98 98 - 111 mmol/L   CO2 21 (L) 22 - 32 mmol/L   Glucose, Bld 140 (H) 70 - 99 mg/dL   BUN 57 (H) 8 - 23 mg/dL   Creatinine, Ser 2.43 (H) 0.44 - 1.00 mg/dL   Calcium 9.5 8.9 - 10.3 mg/dL   Total Protein 7.2 6.5 - 8.1 g/dL   Albumin 4.4 3.5 - 5.0 g/dL   AST 27 15 - 41 U/L   ALT 15 0 - 44 U/L   Alkaline Phosphatase 65 38 - 126 U/L   Total Bilirubin 1.2 0.3 - 1.2 mg/dL   GFR calc non Af Amer 17 (L) >60 mL/min   GFR calc Af Amer 20 (L) >60 mL/min   Anion gap 14 5 - 15    Comment: Performed at [facility], [address] [place], [place] [address]  CBC WITH DIFFERENTIAL     Status: Abnormal   Collection Time: 05/08/19  7:58 PM  Result Value Ref Range   WBC 10.0 4.0 - 10.5 K/uL    RBC 3.22 (L) 3.87 - 5.11 MIL/uL   Hemoglobin 9.9 (L) 12.0 - 15.0 g/dL   HCT 30.1 (L) 36.0 - 46.0 %   MCV 93.5 80.0 - 100.0 fL   MCH 30.7 26.0 - 34.0 pg   MCHC 32.9 30.0 - 36.0 g/dL   RDW 13.6 11.5 - 15.5 %   Platelets 167 150 - 400 K/uL   nRBC 0.0 0.0 - 0.2 %   Neutrophils Relative % 82 %   Neutro Abs 8.2 (H) 1.7 - 7.7 K/uL   Lymphocytes Relative 6 %   Lymphs Abs 0.6 (L) 0.7 - 4.0 K/uL   Monocytes Relative 10 %   Monocytes Absolute 1.0 0.1 - 1.0 K/uL   Eosinophils Relative 2 %   Eosinophils Absolute 0.2 0.0 - 0.5 K/uL   Basophils Relative 0 %   Basophils Absolute 0.0 0.0 - 0.1 K/uL   Immature Granulocytes 0 %   Abs Immature Granulocytes 0.03 0.00 - 0.07 K/uL    Comment: Performed at [facility], [address] [place], [place] [address]  Blood Culture (routine x 2)     Status: None   Collection Time: 05/08/19  7:58 PM   Specimen: BLOOD  Result Value Ref Range   Specimen Description BLOOD BLOOD RIGHT FOREARM    Special Requests      BOTTLES DRAWN AEROBIC AND ANAEROBIC Blood Culture results may not be optimal due to an excessive volume of blood received in culture bottles   Culture      NO GROWTH 5 DAYS Performed at [facility], [address], [place], [place] [address]    Report Status 05/13/2019 FINAL   Blood Culture (routine x 2)     Status: None   Collection Time: 05/08/19  7:58  PM   Specimen: BLOOD  Result Value Ref Range   Specimen Description BLOOD BLOOD LEFT ARM    Special Requests      BOTTLES DRAWN AEROBIC AND ANAEROBIC Blood Culture results may not be optimal due to an excessive volume of blood received in culture bottles   Culture      NO GROWTH 5 DAYS Performed at [facility], [address] [place], [place] [address]    Report Status 05/13/2019 FINAL   Urine culture     Status: Abnormal   Collection Time: 05/08/19  7:58 PM   Specimen: Urine, Random  Result Value Ref Range   Specimen Description      URINE,  RANDOM Performed at [facility], [address] [place], [place] [address]    Special Requests      NONE Performed at [facility], [address] [place], [place] [address]    Culture (A)     <10,000 COLONIES/mL INSIGNIFICANT GROWTH Performed at [facility], [place] [address] [place], [place] [address]    Report Status 05/10/2019 FINAL   Procalcitonin     Status: None   Collection Time: 05/08/19  7:58 PM  Result Value Ref Range   Procalcitonin 0.17 ng/mL    Comment:        Interpretation: PCT (Procalcitonin) <= 0.5 ng/mL: Systemic infection (sepsis) is not likely. Local bacterial infection is possible. (NOTE)       Sepsis PCT Algorithm           Lower Respiratory Tract                                      Infection PCT Algorithm    ----------------------------     ----------------------------         PCT < 0.25 ng/mL                PCT < 0.10 ng/mL         Strongly encourage             Strongly discourage   discontinuation of antibiotics    initiation of antibiotics    ----------------------------     -----------------------------       PCT 0.25 - 0.50 ng/mL            PCT 0.10 - 0.25 ng/mL               OR       >80% decrease in PCT            Discourage initiation of                                            antibiotics      Encourage discontinuation           of antibiotics    ----------------------------     -----------------------------         PCT >= 0.50 ng/mL              PCT 0.26 - 0.50 ng/mL               AND        <80% decrease in PCT             Encourage initiation  of                                             antibiotics       Encourage continuation           of antibiotics    ----------------------------     -----------------------------        PCT >= 0.50 ng/mL                  PCT > 0.50 ng/mL               AND         increase in PCT                  Strongly encourage                                      initiation of  antibiotics    Strongly encourage escalation           of antibiotics                                     -----------------------------                                           PCT <= 0.25 ng/mL                                                 OR                                        > 80% decrease in PCT                                     Discontinue / Do not initiate                                             antibiotics Performed at [facility], [address] [place], [place] [address]   Brain natriuretic peptide     Status: Abnormal   Collection Time: 05/08/19  7:58 PM  Result Value Ref Range   B Natriuretic Peptide 1,949.0 (H) 0.0 - 100.0 pg/mL    Comment: Performed at [facility], [address] [place], [place] [address]  Urinalysis, Complete w Microscopic     Status: Abnormal   Collection Time: 05/08/19  7:58 PM  Result Value Ref Range   Color, Urine STRAW (A) YELLOW   APPearance CLEAR (A) CLEAR   Specific Gravity, Urine 1.009 1.005 - 1.030   pH 5.0 5.0 - 8.0   Glucose, UA NEGATIVE NEGATIVE mg/dL   Hgb urine  dipstick SMALL (A) NEGATIVE   Bilirubin Urine NEGATIVE NEGATIVE   Ketones, ur NEGATIVE NEGATIVE mg/dL   Protein, ur NEGATIVE NEGATIVE mg/dL   Nitrite NEGATIVE NEGATIVE   Leukocytes,Ua NEGATIVE NEGATIVE   WBC, UA 0-5 0 - 5 WBC/hpf   Bacteria, UA NONE SEEN NONE SEEN   Squamous Epithelial / LPF 0-5 0 - 5    Comment: Performed at [facility], [address] [place], [place] [address]  SARS Coronavirus 2 (CEPHEID- Performed in [place] hospital lab), Hosp Order     Status: None   Collection Time: 05/08/19  7:58 PM   Specimen: Nasopharyngeal Swab  Result Value Ref Range   SARS Coronavirus 2 NEGATIVE NEGATIVE    Comment: (NOTE) If result is NEGATIVE SARS-CoV-2 target nucleic acids are NOT DETECTED. The SARS-CoV-2 RNA is generally detectable in upper and lower  respiratory specimens during the acute phase of infection. The  lowest  concentration of SARS-CoV-2 viral copies this assay can detect is 250  copies / mL. A negative result does not preclude SARS-CoV-2 infection  and should not be used as the sole basis for treatment or other  patient management decisions.  A negative result may occur with  improper specimen collection / handling, submission of specimen other  than nasopharyngeal swab, presence of viral mutation(s) within the  areas targeted by this assay, and inadequate number of viral copies  (<250 copies / mL). A negative result must be combined with clinical  observations, patient history, and epidemiological information. If result is POSITIVE SARS-CoV-2 target nucleic acids are DETECTED. The SARS-CoV-2 RNA is generally detectable in upper and lower  respiratory specimens dur ing the acute phase of infection.  Positive  results are indicative of active infection with SARS-CoV-2.  Clinical  correlation with patient history and other diagnostic information is  necessary to determine patient infection status.  Positive results do  not rule out bacterial infection or co-infection with other viruses. If result is PRESUMPTIVE POSTIVE SARS-CoV-2 nucleic acids MAY BE PRESENT.   A presumptive positive result was obtained on the submitted specimen  and confirmed on repeat testing.  While 2019 novel coronavirus  (SARS-CoV-2) nucleic acids may be present in the submitted sample  additional confirmatory testing may be necessary for epidemiological  and / or clinical management purposes  to differentiate between  SARS-CoV-2 and other Sarbecovirus currently known to infect humans.  If clinically indicated additional testing with an alternate test  methodology [phone]) is advised. The SARS-CoV-2 RNA is generally  detectable in upper and lower respiratory sp ecimens during the acute  phase of infection. The expected result is Negative. Fact Sheet for Patients:   StrictlyIdeas.no Fact Sheet for Healthcare Providers: BankingDealers.co.za This test is not yet approved or cleared by the [place] FDA and has been authorized for detection and/or diagnosis of SARS-CoV-2 by FDA under an Emergency Use Authorization (EUA).  This EUA will remain in effect (meaning this test can be used) for the duration of the COVID-19 declaration under Section 564(b)(1) of the Act, 21 U.S.C. section 360bbb-3(b)(1), unless the authorization is terminated or revoked sooner. Performed at [facility], [address] [place], [place] [address]   Troponin I -     Status: Abnormal   Collection Time: 05/08/19  7:58 PM  Result Value Ref Range   Troponin I 0.06 (HH) <0.03 ng/mL    Comment: CRITICAL RESULT CALLED TO, READ BACK BY AND VERIFIED WITH VANESSA ASHLEY AT 2055 ON 05/08/2019 JJB Performed at  [facility], [place], [place] [address]   Glucose, capillary     Status: Abnormal   Collection Time: 05/08/19 11:59 PM  Result Value Ref Range   Glucose-Capillary 123 (H) 70 - 99 mg/dL  MRSA PCR Screening     Status: None   Collection Time: 05/09/19 12:11 AM   Specimen: Nasopharyngeal  Result Value Ref Range   MRSA by PCR NEGATIVE NEGATIVE    Comment:        The GeneXpert MRSA Assay (FDA approved for NASAL specimens only), is one component of a comprehensive MRSA colonization surveillance program. It is not intended to diagnose MRSA infection nor to guide or monitor treatment for MRSA infections. Performed at [facility], [address] [place], [place] [address]   Basic metabolic panel     Status: Abnormal   Collection Time: 05/09/19 12:18 AM  Result Value Ref Range   Sodium 134 (L) 135 - 145 mmol/L   Potassium 4.3 3.5 - 5.1 mmol/L   Chloride 99 98 - 111 mmol/L   CO2 23 22 - 32 mmol/L   Glucose, Bld 134 (H) 70 - 99 mg/dL   BUN 57 (H) 8 - 23 mg/dL   Creatinine, Ser  2.48 (H) 0.44 - 1.00 mg/dL   Calcium 9.4 8.9 - 10.3 mg/dL   GFR calc non Af Amer 17 (L) >60 mL/min   GFR calc Af Amer 20 (L) >60 mL/min   Anion gap 12 5 - 15    Comment: Performed at [facility], [address] [place], [place] [address]  Troponin I - Now Then Q6H     Status: Abnormal   Collection Time: 05/09/19 12:18 AM  Result Value Ref Range   Troponin I 0.32 (HH) <0.03 ng/mL    Comment: CRITICAL VALUE NOTED. VALUE IS CONSISTENT WITH PREVIOUSLY REPORTED/CALLED VALUE JJB Performed at [facility], [address] [place], [place] [address]   CBC with Differential/Platelet     Status: Abnormal   Collection Time: 05/09/19  1:55 AM  Result Value Ref Range   WBC 9.6 4.0 - 10.5 K/uL   RBC 2.84 (L) 3.87 - 5.11 MIL/uL   Hemoglobin 8.8 (L) 12.0 - 15.0 g/dL   HCT 26.6 (L) 36.0 - 46.0 %   MCV 93.7 80.0 - 100.0 fL   MCH 31.0 26.0 - 34.0 pg   MCHC 33.1 30.0 - 36.0 g/dL   RDW 13.5 11.5 - 15.5 %   Platelets 141 (L) 150 - 400 K/uL   nRBC 0.0 0.0 - 0.2 %   Neutrophils Relative % 88 %   Neutro Abs 8.3 (H) 1.7 - 7.7 K/uL   Lymphocytes Relative 6 %   Lymphs Abs 0.6 (L) 0.7 - 4.0 K/uL   Monocytes Relative 6 %   Monocytes Absolute 0.6 0.1 - 1.0 K/uL   Eosinophils Relative 0 %   Eosinophils Absolute 0.0 0.0 - 0.5 K/uL   Basophils Relative 0 %   Basophils Absolute 0.0 0.0 - 0.1 K/uL   Immature Granulocytes 0 %   Abs Immature Granulocytes 0.03 0.00 - 0.07 K/uL    Comment: Performed at [facility], [address] [place],  [address]  Troponin I - Now Then Q6H     Status: Abnormal   Collection Time: 05/09/19  6:49 AM  Result Value Ref Range   Troponin I 0.69 (HH) <0.03 ng/mL    Comment: CRITICAL RESULT CALLED TO, READ BACK BY AND VERIFIED WITH CHARLIE  SWEETWOOD@0735  ON 05/09/19 BY HKP Performed at [facility], [place], [place] [address]   Troponin I - Now Then Q6H     Status: Abnormal   Collection Time: 05/09/19 12:42 PM   Result Value Ref Range   Troponin I 0.62 (HH) <0.03 ng/mL    Comment: CRITICAL VALUE NOTED. VALUE IS CONSISTENT WITH PREVIOUSLY REPORTED/CALLED VALUE/HKP Performed at [facility], [address] [place], [place] [address]   ECHOCARDIOGRAM COMPLETE     Status: None   Collection Time: 05/09/19  1:23 PM  Result Value Ref Range   Weight 2,694.9 oz   Height 62 in   BP 147/47 mmHg  Basic metabolic panel     Status: Abnormal   Collection Time: 05/10/19  5:51 AM  Result Value Ref Range   Sodium 136 135 - 145 mmol/L   Potassium 3.9 3.5 - 5.1 mmol/L   Chloride 98 98 - 111 mmol/L   CO2 25 22 - 32 mmol/L   Glucose, Bld 109 (H) 70 - 99 mg/dL   BUN 55 (H) 8 - 23 mg/dL   Creatinine, Ser 2.38 (H) 0.44 - 1.00 mg/dL   Calcium 9.7 8.9 - 10.3 mg/dL   GFR calc non Af Amer 18 (L) >60 mL/min   GFR calc Af Amer 21 (L) >60 mL/min   Anion gap 13 5 - 15    Comment: Performed at [facility], [address] [place], [place] [address]  CBC with Differential/Platelet     Status: Abnormal   Collection Time: 05/16/19 11:39 AM  Result Value Ref Range   WBC 6.2 3.4 - 10.8 x10E3/uL   RBC 2.94 (L) 3.77 - 5.28 x10E6/uL   Hemoglobin 9.2 (L) 11.1 - 15.9 g/dL   Hematocrit 27.3 (L) 34.0 - 46.6 %   MCV 93 79 - 97 fL   MCH 31.3 26.6 - 33.0 pg   MCHC 33.7 31.5 - 35.7 g/dL   RDW 12.9 11.7 - 15.4 %   Platelets 195 150 - 450 x10E3/uL   Neutrophils 63 Not Estab. %   Lymphs 17 Not Estab. %   Monocytes 14 Not Estab. %   Eos 5 Not Estab. %   Basos 1 Not Estab. %   Neutrophils Absolute 3.9 1.4 - 7.0 x10E3/uL   Lymphocytes Absolute 1.0 0.7 - 3.1 x10E3/uL   Monocytes Absolute 0.9 0.1 - 0.9 x10E3/uL   EOS (ABSOLUTE) 0.3 0.0 - 0.4 x10E3/uL   Basophils Absolute 0.1 0.0 - 0.2 x10E3/uL   Immature Granulocytes 0 Not Estab. %   Immature Grans (Abs) 0.0 0.0 - 0.1 x10E3/uL  Comprehensive metabolic panel     Status: Abnormal   Collection Time: 05/16/19 11:39 AM  Result Value Ref Range   Glucose 105 (H)  65 - 99 mg/dL   BUN 60 (H) 8 - 27 mg/dL   Creatinine, Ser 2.58 (H) 0.57 - 1.00 mg/dL   GFR calc non Af Amer 16 (L) >59 mL/min/1.73   GFR calc Af Amer 19 (L) >59 mL/min/1.73   BUN/Creatinine Ratio 23 12 - 28   Sodium 131 (L) 134 - 144 mmol/L   Potassium 4.2 3.5 - 5.2 mmol/L   Chloride 93 (L) 96 - 106 mmol/L   CO2 21 20 - 29 mmol/L   Calcium 9.9 8.7 - 10.3 mg/dL   Total Protein 6.3 6.0 - 8.5 g/dL   Albumin 4.2 3.6 - 4.6 g/dL   Globulin, Total 2.1 1.5 - 4.5 g/dL   Albumin/Globulin Ratio  2.0 1.2 - 2.2   Bilirubin Total 0.7 0.0 - 1.2 mg/dL   Alkaline Phosphatase 70 39 - 117 IU/L   AST 18 0 - 40 IU/L   ALT 10 0 - 32 IU/L    Radiology: Dg Chest Port 1 View  Result Date: 05/09/2019 CLINICAL DATA:  Follow-up infiltrates EXAM: PORTABLE CHEST 1 VIEW COMPARISON:  05/08/2019 FINDINGS: Cardiac shadow is stable. Aortic calcifications are again seen. Bibasilar opacities are again seen and stable. Some improved vascular congestion is noted. No sizable effusion is noted. No bony abnormality is seen. IMPRESSION: Persistent bibasilar infiltrates. Electronically Signed   By: Inez Catalina M.D.   On: 05/09/2019 07:49    No results found.  Dg Chest Port 1 View  Result Date: 05/09/2019 CLINICAL DATA:  Follow-up infiltrates EXAM: PORTABLE CHEST 1 VIEW COMPARISON:  05/08/2019 FINDINGS: Cardiac shadow is stable. Aortic calcifications are again seen. Bibasilar opacities are again seen and stable. Some improved vascular congestion is noted. No sizable effusion is noted. No bony abnormality is seen. IMPRESSION: Persistent bibasilar infiltrates. Electronically Signed   By: Inez Catalina M.D.   On: 05/09/2019 07:49   Dg Chest Portable 1 View  Result Date: 05/08/2019 CLINICAL DATA:  85 year old female with increased shortness of breath. Test for COVID-19 pending. EXAM: PORTABLE CHEST 1 VIEW COMPARISON:  01/26/2019 and earlier. FINDINGS: Portable AP upright view at 1946 hours. Acute on chronic bilateral pulmonary  interstitial opacity. Lung volumes are lower. Stable cardiac size and mediastinal contours. Visualized tracheal air column is within normal limits. No pneumothorax, pleural effusion or consolidation. Paucity of bowel gas in the upper abdomen. No acute osseous abnormality identified. IMPRESSION: Lower lung volumes with acute on chronic pulmonary interstitial opacity. Consider viral/atypical respiratory infection and pulmonary interstitial edema. Electronically Signed   By: Genevie Ann M.D.   On: 05/08/2019 20:07      Assessment and Plan: Patient Active Problem List   Diagnosis Date Noted  . Acute CHF (congestive heart failure) ([place]) 05/09/2019  . Acute respiratory failure ([place]) 05/08/2019  . GI bleed 10/16/2017  . Closed fracture of tibial plateau 06/24/2015  . Current tear knee, medial meniscus 06/24/2015  . Arthritis of knee, degenerative 06/24/2015  . Angiopathy, peripheral ([place]) 06/06/2015  . Arterial vascular disease 06/06/2015  . Bunion 06/06/2015  . Cervical adenitis 06/06/2015  . Gout due to renal impairment of multiple sites 06/06/2015  . Chronic infection 06/06/2015  . Contusion of forearm 06/06/2015  . Arm bruise 06/06/2015  . Asthma, cough variant 06/06/2015  . Gastroesophageal reflux disease with hiatal hernia 06/06/2015  . History of DVT of lower extremity 06/06/2015  . Bergmann's syndrome 06/06/2015  . H/O arterial bypass of lower limb 06/06/2015  . HLD (hyperlipidemia) 06/06/2015  . Benign hypertension 06/06/2015  . Elevated blood uric acid level 06/06/2015  . Disorder of kidney 06/06/2015  . Lump in neck 06/06/2015  . Excessive urination at night 06/06/2015  . Arthritis, degenerative 06/06/2015  . Poor balance 06/06/2015  . Peripheral blood vessel disorder ([place]) 06/06/2015  . Acid reflux 03/08/2013  . Atherosclerosis of native artery of extremity ([place]) 01/22/2013  . Chronic kidney disease 01/22/2013  . CAD in native artery 01/22/2013  . Essential (primary)  hypertension 01/22/2013  . Atherosclerosis of native arteries of extremity with rest pain ([place]) 01/22/2013  . Arthralgia of hip or thigh 03/29/2006   1. Congestive heart failure, unspecified HF chronicity, unspecified heart failure type ([place]) Pt doing better since hospital admission.  Continue current management, and follow  up with cardiology as discussed.   2. Gastroesophageal reflux disease without esophagitis Stable, continue present management.   3. CKD (chronic kidney disease), stage III ([place]) Continue to follow up with Nephrology as scheduled.   4. Hypoxia Continue to use oxygen continuously 2lpm. Doing well with oxygen as this time.     General Counseling: I have discussed the findings of the evaluation and examination with Adelena.  I have also discussed any further diagnostic evaluation thatmay be needed or ordered today. Aunika verbalizes understanding of the findings of todays visit. We also reviewed her medications today and discussed drug interactions and side effects including but not limited excessive drowsiness and altered mental states. We also discussed that there is always a risk not just to her but also people around her. she has been encouraged to call the office with any questions or concerns that should arise related to todays visit.    Time spent: 25 This patient was seen by Orson Gear AGNP-C in Collaboration with Dr. Devona Konig as a part of collaborative care agreement.   I have personally obtained a history, examined the patient, evaluated laboratory and imaging results, formulated the assessment and plan and placed orders.    Allyne Gee, MD [facility] Pulmonary and Critical [facility] Sleep medicine

## 2019-05-18 NOTE — Telephone Encounter (Signed)
Has had an itching "thick" rash on arms since taking a new antibiotic (Omnicef) for 3 days. Daughter had sprayed her arms with an anti-itch medication this afternoon. May need a prednisone 6 day taper but she wants to wait until her daughter [name]) can call back tomorrow. Feels the use of oxygen by nasal cannula has helped with her energy level a bit but still having shortness of breath from her CHF and COPD. Should use Albuterol prn for any wheezing and an antihistamine (claritin). Was seen by Dr. Humphrey Rolls (pulmonologist) today.

## 2019-05-19 ENCOUNTER — Telehealth: Payer: Self-pay

## 2019-05-19 ENCOUNTER — Other Ambulatory Visit: Payer: Self-pay | Admitting: Family Medicine

## 2019-05-19 DIAGNOSIS — K922 Gastrointestinal hemorrhage, unspecified: Secondary | ICD-10-CM

## 2019-05-19 DIAGNOSIS — L299 Pruritus, unspecified: Secondary | ICD-10-CM

## 2019-05-19 LAB — IFOBT (OCCULT BLOOD): IFOBT: NEGATIVE

## 2019-05-19 MED ORDER — PREDNISONE 5 MG PO TABS: 5.0000 mg | ORAL_TABLET | Freq: Every day | ORAL | 0 refills | Status: DC

## 2019-05-19 NOTE — Telephone Encounter (Signed)
Patient was advised. KW 

## 2019-05-19 NOTE — Telephone Encounter (Signed)
-----   Message from Margo Common, [place] sent at 05/18/2019  9:11 AM EDT ----- Hgb and Hct slowly improving. Kidney function worsening with low sodium and chloride. Should consult with Dr. Candiss Norse (nephrologist). May need to take the Lasix (furosemide) every day or limit water intake to 32 ounces a day then recheck electrolytes and renal function in 2 weeks.

## 2019-05-26 DIAGNOSIS — N2581 Secondary hyperparathyroidism of renal origin: Secondary | ICD-10-CM | POA: Diagnosis not present

## 2019-05-26 DIAGNOSIS — I129 Hypertensive chronic kidney disease with stage 1 through stage 4 chronic kidney disease, or unspecified chronic kidney disease: Secondary | ICD-10-CM | POA: Diagnosis not present

## 2019-05-26 DIAGNOSIS — E872 Acidosis: Secondary | ICD-10-CM | POA: Diagnosis not present

## 2019-05-26 DIAGNOSIS — M109 Gout, unspecified: Secondary | ICD-10-CM | POA: Diagnosis not present

## 2019-05-26 DIAGNOSIS — N184 Chronic kidney disease, stage 4 (severe): Secondary | ICD-10-CM | POA: Diagnosis not present

## 2019-05-26 DIAGNOSIS — R609 Edema, unspecified: Secondary | ICD-10-CM | POA: Diagnosis not present

## 2019-05-30 ENCOUNTER — Inpatient Hospital Stay
Admission: AD | Admit: 2019-05-30 | Discharge: 2019-06-05 | DRG: 291 | Disposition: A | Discharge status: Home / Self Care | Payer: Medicare PPO | Source: Ambulatory Visit | Attending: Internal Medicine | Admitting: Internal Medicine

## 2019-05-30 ENCOUNTER — Other Ambulatory Visit: Payer: Self-pay

## 2019-05-30 DIAGNOSIS — Z9981 Dependence on supplemental oxygen: Secondary | ICD-10-CM

## 2019-05-30 DIAGNOSIS — Z8249 Family history of ischemic heart disease and other diseases of the circulatory system: Secondary | ICD-10-CM

## 2019-05-30 DIAGNOSIS — Z87891 Personal history of nicotine dependence: Secondary | ICD-10-CM

## 2019-05-30 DIAGNOSIS — I509 Heart failure, unspecified: Secondary | ICD-10-CM | POA: Diagnosis not present

## 2019-05-30 DIAGNOSIS — I5033 Acute on chronic diastolic (congestive) heart failure: Secondary | ICD-10-CM | POA: Diagnosis not present

## 2019-05-30 DIAGNOSIS — E785 Hyperlipidemia, unspecified: Secondary | ICD-10-CM | POA: Diagnosis present

## 2019-05-30 DIAGNOSIS — I132 Hypertensive heart and chronic kidney disease with heart failure and with stage 5 chronic kidney disease, or end stage renal disease: Secondary | ICD-10-CM | POA: Diagnosis not present

## 2019-05-30 DIAGNOSIS — I739 Peripheral vascular disease, unspecified: Secondary | ICD-10-CM | POA: Diagnosis not present

## 2019-05-30 DIAGNOSIS — I350 Nonrheumatic aortic (valve) stenosis: Secondary | ICD-10-CM | POA: Diagnosis not present

## 2019-05-30 DIAGNOSIS — I4891 Unspecified atrial fibrillation: Secondary | ICD-10-CM | POA: Diagnosis present

## 2019-05-30 DIAGNOSIS — Z7982 Long term (current) use of aspirin: Secondary | ICD-10-CM | POA: Diagnosis not present

## 2019-05-30 DIAGNOSIS — H9190 Unspecified hearing loss, unspecified ear: Secondary | ICD-10-CM | POA: Diagnosis present

## 2019-05-30 DIAGNOSIS — M1A9XX0 Chronic gout, unspecified, without tophus (tophi): Secondary | ICD-10-CM | POA: Diagnosis present

## 2019-05-30 DIAGNOSIS — R809 Proteinuria, unspecified: Secondary | ICD-10-CM | POA: Diagnosis present

## 2019-05-30 DIAGNOSIS — K219 Gastro-esophageal reflux disease without esophagitis: Secondary | ICD-10-CM | POA: Diagnosis present

## 2019-05-30 DIAGNOSIS — Z880 Allergy status to penicillin: Secondary | ICD-10-CM

## 2019-05-30 DIAGNOSIS — I10 Essential (primary) hypertension: Secondary | ICD-10-CM | POA: Diagnosis not present

## 2019-05-30 DIAGNOSIS — Z20828 Contact with and (suspected) exposure to other viral communicable diseases: Secondary | ICD-10-CM | POA: Diagnosis present

## 2019-05-30 DIAGNOSIS — N2581 Secondary hyperparathyroidism of renal origin: Secondary | ICD-10-CM | POA: Diagnosis not present

## 2019-05-30 DIAGNOSIS — Z7952 Long term (current) use of systemic steroids: Secondary | ICD-10-CM

## 2019-05-30 DIAGNOSIS — Z955 Presence of coronary angioplasty implant and graft: Secondary | ICD-10-CM

## 2019-05-30 DIAGNOSIS — Z86718 Personal history of other venous thrombosis and embolism: Secondary | ICD-10-CM

## 2019-05-30 DIAGNOSIS — Z85828 Personal history of other malignant neoplasm of skin: Secondary | ICD-10-CM

## 2019-05-30 DIAGNOSIS — N179 Acute kidney failure, unspecified: Secondary | ICD-10-CM | POA: Diagnosis present

## 2019-05-30 DIAGNOSIS — I129 Hypertensive chronic kidney disease with stage 1 through stage 4 chronic kidney disease, or unspecified chronic kidney disease: Secondary | ICD-10-CM | POA: Diagnosis not present

## 2019-05-30 DIAGNOSIS — N186 End stage renal disease: Secondary | ICD-10-CM

## 2019-05-30 DIAGNOSIS — J449 Chronic obstructive pulmonary disease, unspecified: Secondary | ICD-10-CM | POA: Diagnosis present

## 2019-05-30 DIAGNOSIS — R0602 Shortness of breath: Secondary | ICD-10-CM | POA: Diagnosis not present

## 2019-05-30 DIAGNOSIS — Z79899 Other long term (current) drug therapy: Secondary | ICD-10-CM | POA: Diagnosis not present

## 2019-05-30 DIAGNOSIS — N189 Chronic kidney disease, unspecified: Secondary | ICD-10-CM | POA: Diagnosis not present

## 2019-05-30 DIAGNOSIS — D631 Anemia in chronic kidney disease: Secondary | ICD-10-CM | POA: Diagnosis present

## 2019-05-30 DIAGNOSIS — I5032 Chronic diastolic (congestive) heart failure: Secondary | ICD-10-CM | POA: Diagnosis not present

## 2019-05-30 DIAGNOSIS — Z888 Allergy status to other drugs, medicaments and biological substances status: Secondary | ICD-10-CM

## 2019-05-30 DIAGNOSIS — I2510 Atherosclerotic heart disease of native coronary artery without angina pectoris: Secondary | ICD-10-CM | POA: Diagnosis not present

## 2019-05-30 DIAGNOSIS — Z885 Allergy status to narcotic agent status: Secondary | ICD-10-CM

## 2019-05-30 DIAGNOSIS — H3530 Unspecified macular degeneration: Secondary | ICD-10-CM | POA: Diagnosis present

## 2019-05-30 DIAGNOSIS — N184 Chronic kidney disease, stage 4 (severe): Secondary | ICD-10-CM | POA: Diagnosis not present

## 2019-05-30 DIAGNOSIS — R600 Localized edema: Secondary | ICD-10-CM | POA: Diagnosis not present

## 2019-05-30 DIAGNOSIS — Z882 Allergy status to sulfonamides status: Secondary | ICD-10-CM

## 2019-05-30 LAB — CBC
HCT: 26.7 % — ABNORMAL LOW (ref 36.0–46.0)
Hemoglobin: 8.7 g/dL — ABNORMAL LOW (ref 12.0–15.0)
MCH: 30.2 pg (ref 26.0–34.0)
MCHC: 32.6 g/dL (ref 30.0–36.0)
MCV: 92.7 fL (ref 80.0–100.0)
Platelets: 156 10*3/uL (ref 150–400)
RBC: 2.88 MIL/uL — ABNORMAL LOW (ref 3.87–5.11)
RDW: 14.5 % (ref 11.5–15.5)
WBC: 5.1 10*3/uL (ref 4.0–10.5)
nRBC: 0 % (ref 0.0–0.2)

## 2019-05-30 LAB — COMPREHENSIVE METABOLIC PANEL
ALT: 18 U/L (ref 0–44)
AST: 32 U/L (ref 15–41)
Albumin: 3.9 g/dL (ref 3.5–5.0)
Alkaline Phosphatase: 59 U/L (ref 38–126)
Anion gap: 17 — ABNORMAL HIGH (ref 5–15)
BUN: 113 mg/dL — ABNORMAL HIGH (ref 8–23)
CO2: 17 mmol/L — ABNORMAL LOW (ref 22–32)
Calcium: 9 mg/dL (ref 8.9–10.3)
Chloride: 91 mmol/L — ABNORMAL LOW (ref 98–111)
Creatinine, Ser: 6.16 mg/dL — ABNORMAL HIGH (ref 0.44–1.00)
GFR calc Af Amer: 7 mL/min — ABNORMAL LOW (ref >60)
GFR calc non Af Amer: 6 mL/min — ABNORMAL LOW (ref >60)
Glucose, Bld: 124 mg/dL — ABNORMAL HIGH (ref 70–99)
Potassium: 5.1 mmol/L (ref 3.5–5.1)
Sodium: 125 mmol/L — ABNORMAL LOW (ref 135–145)
Total Bilirubin: 0.9 mg/dL (ref 0.3–1.2)
Total Protein: 6.5 g/dL (ref 6.5–8.1)

## 2019-05-30 LAB — SARS CORONAVIRUS 2 BY RT PCR (HOSPITAL ORDER, PERFORMED IN ~~LOC~~ HOSPITAL LAB): SARS Coronavirus 2: NEGATIVE

## 2019-05-30 LAB — CREATININE, SERUM
Creatinine, Ser: 6.2 mg/dL — ABNORMAL HIGH (ref 0.44–1.00)
GFR calc Af Amer: 6 mL/min — ABNORMAL LOW (ref >60)
GFR calc non Af Amer: 6 mL/min — ABNORMAL LOW (ref >60)

## 2019-05-30 MED ORDER — SODIUM CHLORIDE 0.9% FLUSH: 3.0000 mL | INTRAVENOUS | Status: DC | PRN

## 2019-05-30 MED ORDER — AMLODIPINE BESYLATE 10 MG PO TABS: 10.0000 mg | ORAL_TABLET | Freq: Every day | ORAL | Status: DC

## 2019-05-30 MED ORDER — LORATADINE 10 MG PO TABS
10.0000 mg | ORAL_TABLET | Freq: Every day | ORAL | Status: DC
  Administered 2019-06-01 – 2019-06-05 (×5): 10 mg via ORAL
  Filled 2019-05-30 (×5): qty 1

## 2019-05-30 MED ORDER — ONDANSETRON HCL 4 MG/2ML IJ SOLN: 4.0000 mg | Freq: Four times a day (QID) | INTRAMUSCULAR | Status: DC | PRN

## 2019-05-30 MED ORDER — ASPIRIN EC 81 MG PO TBEC
81.0000 mg | DELAYED_RELEASE_TABLET | Freq: Every day | ORAL | Status: DC
  Administered 2019-06-01 – 2019-06-05 (×5): 81 mg via ORAL
  Filled 2019-05-30 (×5): qty 1

## 2019-05-30 MED ORDER — PRAVASTATIN SODIUM 20 MG PO TABS
20.0000 mg | ORAL_TABLET | Freq: Every day | ORAL | Status: DC
  Administered 2019-05-30 – 2019-06-04 (×6): 20 mg via ORAL
  Filled 2019-05-30 (×6): qty 1

## 2019-05-30 MED ORDER — FUROSEMIDE 10 MG/ML IJ SOLN
8.0000 mg/h | INTRAVENOUS | Status: AC
  Administered 2019-05-30: 8 mg/h via INTRAVENOUS
  Filled 2019-05-30: qty 25

## 2019-05-30 MED ORDER — ALLOPURINOL 100 MG PO TABS
100.0000 mg | ORAL_TABLET | Freq: Two times a day (BID) | ORAL | Status: DC
  Administered 2019-05-30 – 2019-06-05 (×11): 100 mg via ORAL
  Filled 2019-05-30 (×14): qty 1

## 2019-05-30 MED ORDER — ACETAMINOPHEN 325 MG PO TABS
650.0000 mg | ORAL_TABLET | ORAL | Status: DC | PRN
  Administered 2019-05-31 – 2019-06-04 (×6): 650 mg via ORAL
  Filled 2019-05-30 (×6): qty 2

## 2019-05-30 MED ORDER — SODIUM CHLORIDE 0.9 % IV SOLN: 250.0000 mL | INTRAVENOUS | Status: DC | PRN

## 2019-05-30 MED ORDER — HEPARIN SODIUM (PORCINE) 5000 UNIT/ML IJ SOLN
5000.0000 [IU] | Freq: Three times a day (TID) | INTRAMUSCULAR | Status: DC
  Administered 2019-05-30 – 2019-06-05 (×15): 5000 [IU] via SUBCUTANEOUS
  Filled 2019-05-30 (×15): qty 1

## 2019-05-30 MED ORDER — SODIUM CHLORIDE 0.9% FLUSH
3.0000 mL | Freq: Two times a day (BID) | INTRAVENOUS | Status: DC
  Administered 2019-05-30 – 2019-06-05 (×11): 3 mL via INTRAVENOUS

## 2019-05-30 MED ORDER — COLCHICINE 0.6 MG PO TABS: 0.6000 mg | ORAL_TABLET | Freq: Every day | ORAL | Status: DC

## 2019-05-30 NOTE — Consult Note (Signed)
[facility] Cardiology  CARDIOLOGY CONSULT NOTE  Patient ID: Lori Johnson MRN: [id number] DOB/AGE: Mar 24, 1930 85 y.o.  Admit date: 05/30/2019 Referring Physician Posey Pronto Primary Physician [name] PA Primary Cardiologist [facility] Reason for Consultation congestive heart failure  HPI: 85 year old female referred for evaluation of congestive heart failure.  The patient has a history of chronic diastolic congestive heart failure, and moderate to severe aortic stenosis.  Was recently hospitalized with pneumonia.  She presents with 1 week history of worsening peripheral edema with acute kidney injury with underlying chronic kidney disease.  Admission BUN and creatinine 113 and 6.16, respectively.  Patient was started on furosemide drip with minimal urine output.  Prior 2D echocardiogram 10/17/2017 revealed normal left ventricular function, with LVEF 55 to 65%, with calculated aortic valve area 0.78 cm, mean gradient 26 mmHg, and peak gradient 44 mmHg.  Review of systems complete and found to be negative unless listed above     Past Medical History:  Diagnosis Date  . Angiopathy, peripheral ([place])   . Arterial embolus and thrombosis ([place])   . ASCVD (arteriosclerotic cardiovascular disease)    with stent RCA  . Bilateral bunions   . Cancer ([place])    SCC removed off left leg  . Cervical lymphadenitis   . Chronic gout of left ankle due to renal impairment without tophus   . Chronic infection June 2014   Has chronic infection of right femoral graft with history of extensive debridement in June 2014. Was on oral Clindamycin for 3 weeks for culture positive for anerobes and staph lugdunensis. Vascular Surgeon at [facility] Remonia Richter, MD) recommended indefinite suppressive therapy with Cephalexin 500 mg qd and reassessment every 3 months. Will refill Cephalexin.)  . Chronic kidney disease   . COPD (chronic obstructive pulmonary disease) ([place])   . CRF (chronic renal failure)   . GERD (gastroesophageal reflux  disease)   . Hiatal hernia   . History of arterial bypass of lower extremity   . Hyperlipidemia   . Hypertension   . Hyperuricemia   . Macular degeneration   . Mass of right side of neck   . Nocturia   . OA (osteoarthritis)   . PVD (peripheral vascular disease) ([place])   . Renal disorder     Past Surgical History:  Procedure Laterality Date  . ABDOMINAL HYSTERECTOMY  1981  . arterial bypass right leg Right   . broken left foot Left 01/2002  . CHOLECYSTECTOMY    . CORONARY ANGIOPLASTY WITH STENT PLACEMENT    . stent in leg      Medications Prior to Admission  Medication Sig Dispense Refill Last Dose  . Acetaminophen (TYLENOL ARTHRITIS PAIN PO) Take by mouth as needed.      [name] albuterol (VENTOLIN HFA) 108 (90 Base) MCG/ACT inhaler TAKE 2 PUFFS BY MOUTH EVERY 6 HOURS AS NEEDED FOR WHEEZE OR SHORTNESS OF BREATH 18 Inhaler 3   . allopurinol (ZYLOPRIM) 100 MG tablet Take 100 mg by mouth 2 (two) times daily.      [name] amLODipine (NORVASC) 10 MG tablet TAKE 1 TABLET EVERY DAY 90 tablet 3   . aspirin EC 81 MG tablet Take 81 mg by mouth daily.     . cephALEXin (KEFLEX) 500 MG capsule Take 500 mg by mouth daily.     . colchicine 0.6 MG tablet Take 0.6 mg by mouth as needed.      . furosemide (LASIX) 40 MG tablet Take 40 mg every other day by mouth.      [name]  hydrocortisone valerate cream (WESTCORT) 0.2 % Apply 1 application topically as needed.      . Loratadine (CLARITIN PO) Take 10 mg by mouth daily.      [name] losartan-hydrochlorothiazide (HYZAAR) 50-12.5 MG tablet TAKE 1 TABLET EVERY DAY 90 tablet 3   . Multiple Vitamins-Minerals (ICAPS AREDS 2 PO) Take 1 tablet by mouth daily.     . OXYGEN Inhale 2 L into the lungs.     . pantoprazole (PROTONIX) 20 MG tablet Take 2 caps. Daily in am. (Patient taking differently: Take by mouth. Take 2 caps. Daily in am.) 180 tablet 3   . pravastatin (PRAVACHOL) 20 MG tablet TAKE 1 TABLET AT BEDTIME 90 tablet 3   . predniSONE (DELTASONE) 5 MG tablet Take 1 tablet  (5 mg total) by mouth daily with breakfast. Taper down by one tablet daily starting at 6 (6,5,4,3,2,1) 21 tablet 0   . vitamin B-12 (CYANOCOBALAMIN) 100 MCG tablet Take 100 mcg by mouth daily.     [name] VITAMIN D PO Take by mouth.      Social History   Socioeconomic History  . Marital status: Widowed    Spouse name: Not on file  . Number of children: 3  . Years of education: Not on file  . Highest education level: 9th grade  Occupational History  . Occupation: retired  Scientific laboratory technician  . Financial resource strain: Not hard at all  . Food insecurity    Worry: Never true    Inability: Never true  . Transportation needs    Medical: No    Non-medical: No  Tobacco Use  . Smoking status: Former Smoker    Types: Cigarettes  . Smokeless tobacco: Never Used  . Tobacco comment: quit >30-40 years ago  Substance and Sexual Activity  . Alcohol use: No  . Drug use: No  . Sexual activity: Not Currently  Lifestyle  . Physical activity    Days per week: 0 days    Minutes per session: 0 min  . Stress: Not at all  Relationships  . Social Herbalist on phone: Patient refused    Gets together: Patient refused    Attends religious service: Patient refused    Active member of club or organization: Patient refused    Attends meetings of clubs or organizations: Patient refused    Relationship status: Patient refused  . Intimate partner violence    Fear of current or ex partner: Patient refused    Emotionally abused: Patient refused    Physically abused: Patient refused    Forced sexual activity: Patient refused  Other Topics Concern  . Not on file  Social History Narrative   Lives with daughter. Independent at baseline. Uses walker only for outside    Family History  Problem Relation Age of Onset  . Heart disease Mother   . Heart disease Father       Review of systems complete and found to be negative unless listed above      PHYSICAL EXAM  General: Well developed, well  nourished, in no acute distress HEENT:  Normocephalic and atramatic Neck:  No JVD.  Lungs: Clear bilaterally to auscultation and percussion. Heart: HRRR . Normal S1 and S2 without gallops or murmurs.  Abdomen: Bowel sounds are positive, abdomen soft and non-tender  Msk:  Back normal, normal gait. Normal strength and tone for age. Extremities: No clubbing, cyanosis or edema.   Neuro: Alert and oriented X 3. Psych:  Good affect, responds  appropriately  Labs:   Lab Results  Component Value Date   WBC 5.1 05/30/2019   HGB 8.7 (L) 05/30/2019   HCT 26.7 (L) 05/30/2019   MCV 92.7 05/30/2019   PLT 156 05/30/2019    Recent Labs  Lab 05/30/19 1252  NA 125*  K 5.1  CL 91*  CO2 17*  BUN 113*  CREATININE 6.16*  6.20*  CALCIUM 9.0  PROT 6.5  BILITOT 0.9  ALKPHOS 59  ALT 18  AST 32  GLUCOSE 124*   Lab Results  Component Value Date   CKTOTAL 114 10/28/2013   CKMB 4.0 (H) 10/27/2013   TROPONINI 0.62 (HH) 05/09/2019    Lab Results  Component Value Date   CHOL 176 12/06/2015   CHOL 149 10/28/2013   Lab Results  Component Value Date   HDL 36 (L) 12/06/2015   HDL 44 10/28/2013   Lab Results  Component Value Date   LDLCALC 102 (H) 12/06/2015   LDLCALC 95 10/28/2013   Lab Results  Component Value Date   TRIG 191 (H) 12/06/2015   TRIG 52 10/28/2013   Lab Results  Component Value Date   CHOLHDL 4.9 (H) 12/06/2015   No results found for: LDLDIRECT    Radiology: Dg Chest Port 1 View  Result Date: 05/09/2019 CLINICAL DATA:  Follow-up infiltrates EXAM: PORTABLE CHEST 1 VIEW COMPARISON:  05/08/2019 FINDINGS: Cardiac shadow is stable. Aortic calcifications are again seen. Bibasilar opacities are again seen and stable. Some improved vascular congestion is noted. No sizable effusion is noted. No bony abnormality is seen. IMPRESSION: Persistent bibasilar infiltrates. Electronically Signed   By: Inez Catalina M.D.   On: 05/09/2019 07:49   Dg Chest Portable 1 View  Result Date:  05/08/2019 CLINICAL DATA:  85 year old female with increased shortness of breath. Test for COVID-19 pending. EXAM: PORTABLE CHEST 1 VIEW COMPARISON:  01/26/2019 and earlier. FINDINGS: Portable AP upright view at 1946 hours. Acute on chronic bilateral pulmonary interstitial opacity. Lung volumes are lower. Stable cardiac size and mediastinal contours. Visualized tracheal air column is within normal limits. No pneumothorax, pleural effusion or consolidation. Paucity of bowel gas in the upper abdomen. No acute osseous abnormality identified. IMPRESSION: Lower lung volumes with acute on chronic pulmonary interstitial opacity. Consider viral/atypical respiratory infection and pulmonary interstitial edema. Electronically Signed   By: Genevie Ann M.D.   On: 05/08/2019 20:07    EKG: Normal sinus rhythm with nonspecific ST abnormality  ASSESSMENT AND PLAN:   1.  Acute on chronic diastolic congestive heart failure, in the setting of acute kidney injury with poor urine output on furosemide drip 2.  Moderate to severe aortic stenosis of uncertain clinical significance 3.  Acute kidney injury with underlying chronic kidney disease with minimal urine output on furosemide drip  Recommendations  1.  Agree with current therapy 2.  Continue furosemide drip 3.  Review 2D echocardiogram 4.  Further recommendations pending echocardiogram results  Signed: Isaias Cowman MD,PhD, [facility] 05/30/2019, 5:35 PM

## 2019-05-30 NOTE — Progress Notes (Signed)
Per Dr. Posey Pronto, hold all BP medications while patient is on lasix gtt.

## 2019-05-30 NOTE — Progress Notes (Signed)
Patient has only put out 134ml urine since initiation of lasix gtt.  Bladder scanned patient and it showed there was 0 retention of urine.  Dr. Posey Pronto text paged to make aware.

## 2019-05-30 NOTE — H&P (Signed)
[facility] NAME: Lori Johnson    MR#:  [id number]  DATE OF BIRTH:  [birth year]  DATE OF ADMISSION:  05/30/2019  PRIMARY CARE PHYSICIAN: Chrismon, Vickki Muff, PA   REQUESTING/REFERRING PHYSICIAN: Murlean Iba  CHIEF COMPLAINT:  No chief complaint on file.   HISTORY OF PRESENT ILLNESS: Lori Johnson  is a 85 y.o. female with a known history of diastolic CHF, chronic kidney disease, COPD, GERD, hypertension, hyperlipidemia, macular degeneration, peripheral vascular disease as well as moderate to severe aortic stenosis who was just recently discharged from the hospital returns back from Dr. Keturah Barre office for worsening shortness of breath and fluid retention.  Patient and her daughter states that she felt better after being discharged for 2 days but now starting to have recurrence of her swelling and shortness of breath.  Dr. Candiss Norse evaluated the patient recommended she be admitted to the hospital for IV Lasix drip.      PAST MEDICAL HISTORY:   Past Medical History:  Diagnosis Date  . Angiopathy, peripheral ([place])   . Arterial embolus and thrombosis ([place])   . ASCVD (arteriosclerotic cardiovascular disease)    with stent RCA  . Bilateral bunions   . Cancer ([place])    SCC removed off left leg  . Cervical lymphadenitis   . Chronic gout of left ankle due to renal impairment without tophus   . Chronic infection June 2014   Has chronic infection of right femoral graft with history of extensive debridement in June 2014. Was on oral Clindamycin for 3 weeks for culture positive for anerobes and staph lugdunensis. Vascular Surgeon at [facility] Remonia Richter, MD) recommended indefinite suppressive therapy with Cephalexin 500 mg qd and reassessment every 3 months. Will refill Cephalexin.)  . Chronic kidney disease   . COPD (chronic obstructive pulmonary disease) ([place])   . CRF (chronic renal failure)   . GERD (gastroesophageal reflux disease)   . Hiatal hernia    . History of arterial bypass of lower extremity   . Hyperlipidemia   . Hypertension   . Hyperuricemia   . Macular degeneration   . Mass of right side of neck   . Nocturia   . OA (osteoarthritis)   . PVD (peripheral vascular disease) ([place])   . Renal disorder     PAST SURGICAL HISTORY:  Past Surgical History:  Procedure Laterality Date  . ABDOMINAL HYSTERECTOMY  1981  . arterial bypass right leg Right   . broken left foot Left 01/2002  . CHOLECYSTECTOMY    . CORONARY ANGIOPLASTY WITH STENT PLACEMENT    . stent in leg      SOCIAL HISTORY:  Social History   Tobacco Use  . Smoking status: Former Smoker    Types: Cigarettes  . Smokeless tobacco: Never Used  . Tobacco comment: quit >30-40 years ago  Substance Use Topics  . Alcohol use: No    FAMILY HISTORY:  Family History  Problem Relation Age of Onset  . Heart disease Mother   . Heart disease Father     DRUG ALLERGIES:  Allergies  Allergen Reactions  . Pletal [Cilostazol] Swelling  . Erythromycin Hives  . Codeine Nausea Only and Nausea And Vomiting  . Erythromycin Base Itching and Rash  . Omnicef [Cefdinir] Itching and Rash  . Penicillins Hives, Itching and Rash  . Sulfa Antibiotics Rash    REVIEW OF SYSTEMS:   CONSTITUTIONAL: No fever, fatigue or weakness.  EYES: No blurred or double vision.  EARS, NOSE, AND THROAT: No tinnitus or ear pain.  RESPIRATORY: No cough, positive shortness of breath, wheezing or hemoptysis.  CARDIOVASCULAR: No chest pain, orthopnea, positive edema.  GASTROINTESTINAL: No nausea, vomiting, diarrhea or abdominal pain.  GENITOURINARY: No dysuria, hematuria.  ENDOCRINE: No polyuria, nocturia,  HEMATOLOGY: No anemia, easy bruising or bleeding SKIN: No rash or lesion. MUSCULOSKELETAL: No joint pain or arthritis.   NEUROLOGIC: No tingling, numbness, weakness.  PSYCHIATRY: No anxiety or depression.   MEDICATIONS AT HOME:  Prior to Admission medications   Medication Sig Start Date  End Date Taking? Authorizing Provider  Acetaminophen (TYLENOL ARTHRITIS PAIN PO) Take by mouth as needed.     [provider]  albuterol (VENTOLIN HFA) 108 (90 Base) MCG/ACT inhaler TAKE 2 PUFFS BY MOUTH EVERY 6 HOURS AS NEEDED FOR WHEEZE OR SHORTNESS OF BREATH 05/05/19   Chrismon, Vickki Muff, PA  allopurinol (ZYLOPRIM) 100 MG tablet Take 100 mg by mouth 2 (two) times daily.  11/09/18   [provider]  amLODipine (NORVASC) 10 MG tablet TAKE 1 TABLET EVERY DAY 12/29/18   Chrismon, Vickki Muff, PA  aspirin EC 81 MG tablet Take 81 mg by mouth daily.    [provider]  cephALEXin (KEFLEX) 500 MG capsule Take 500 mg by mouth daily.    [provider]  colchicine 0.6 MG tablet Take 0.6 mg by mouth as needed.  12/14/18   [provider]  furosemide (LASIX) 40 MG tablet Take 40 mg every other day by mouth.  04/02/15   [provider]  hydrocortisone valerate cream (WESTCORT) 0.2 % Apply 1 application topically as needed.  08/26/16   [provider]  Loratadine (CLARITIN PO) Take 10 mg by mouth daily.     [provider]  losartan-hydrochlorothiazide (HYZAAR) 50-12.5 MG tablet TAKE 1 TABLET EVERY DAY 02/13/19   Chrismon, Vickki Muff, PA  Multiple Vitamins-Minerals (ICAPS AREDS 2 PO) Take 1 tablet by mouth daily.    [provider]  OXYGEN Inhale 2 L into the lungs.    [provider]  pantoprazole (PROTONIX) 20 MG tablet Take 2 caps. Daily in am. Patient taking differently: Take by mouth. Take 2 caps. Daily in am. 04/04/19   Chrismon, Vickki Muff, PA  pravastatin (PRAVACHOL) 20 MG tablet TAKE 1 TABLET AT BEDTIME 02/13/19   Chrismon, Vickki Muff, PA  predniSONE (DELTASONE) 5 MG tablet Take 1 tablet (5 mg total) by mouth daily with breakfast. Taper down by one tablet daily starting at 6 (6,5,4,3,2,1) 05/19/19   Chrismon, Vickki Muff, PA  vitamin B-12 (CYANOCOBALAMIN) 100 MCG tablet Take 100 mcg by mouth daily.    [provider]  VITAMIN  D PO Take by mouth.    [provider]      PHYSICAL EXAMINATION:   VITAL SIGNS: Blood pressure (!) 102/54, pulse 62, temperature (!) 97.4 F (36.3 C), temperature source Oral, SpO2 95 %.  GENERAL:  85 y.o.-year-old patient lying in the bed with no mild respiratory distress EYES: Pupils equal, round, reactive to light and accommodation. No scleral icterus. Extraocular muscles intact.  HEENT: Head atraumatic, normocephalic. Oropharynx and nasopharynx clear.  NECK:  Supple, no jugular venous distention. No thyroid enlargement, no tenderness.  LUNGS: Bilateral crackles throughout both lung  cARDIOVASCULAR: S1, S2 normal.  Positive systolic murmurs, rubs, or gallops.  ABDOMEN: Soft, nontender, nondistended. Bowel sounds present. No organomegaly or mass.  EXTREMITIES: No pedal edema, cyanosis, or clubbing.  NEUROLOGIC: Cranial nerves II through XII are intact.  Muscle strength 5/5 in all extremities. Sensation intact. Gait not checked.  PSYCHIATRIC: The patient is alert and oriented x 3.  SKIN: No obvious rash, lesion, or ulcer.   LABORATORY PANEL:   CBC No results for input(s): WBC, HGB, HCT, PLT, MCV, MCH, MCHC, RDW, LYMPHSABS, MONOABS, EOSABS, BASOSABS, BANDABS in the last 168 hours.  Invalid input(s): NEUTRABS, BANDSABD ------------------------------------------------------------------------------------------------------------------  Chemistries  No results for input(s): NA, K, CL, CO2, GLUCOSE, BUN, CREATININE, CALCIUM, MG, AST, ALT, ALKPHOS, BILITOT in the last 168 hours.  Invalid input(s): GFRCGP ------------------------------------------------------------------------------------------------------------------ estimated creatinine clearance is 14 mL/min (A) (by C-G formula based on SCr of 2.58 mg/dL (H)). ------------------------------------------------------------------------------------------------------------------ No results for input(s): TSH, T4TOTAL, T3FREE,  THYROIDAB in the last 72 hours.  Invalid input(s): FREET3   Coagulation profile No results for input(s): INR, PROTIME in the last 168 hours. ------------------------------------------------------------------------------------------------------------------- No results for input(s): DDIMER in the last 72 hours. -------------------------------------------------------------------------------------------------------------------  Cardiac Enzymes No results for input(s): CKMB, TROPONINI, MYOGLOBIN in the last 168 hours.  Invalid input(s): CK ------------------------------------------------------------------------------------------------------------------ Invalid input(s): POCBNP  ---------------------------------------------------------------------------------------------------------------  Urinalysis    Component Value Date/Time   COLORURINE STRAW (A) 05/08/2019 1958   APPEARANCEUR CLEAR (A) 05/08/2019 1958   APPEARANCEUR Clear 10/27/2013 1403   LABSPEC 1.009 05/08/2019 1958   LABSPEC 1.009 10/27/2013 1403   PHURINE 5.0 05/08/2019 Export NEGATIVE 05/08/2019 1958   GLUCOSEU Negative 10/27/2013 1403   HGBUR SMALL (A) 05/08/2019 1958   BILIRUBINUR NEGATIVE 05/08/2019 1958   BILIRUBINUR Negative 10/27/2013 1403   [place] 05/08/2019 1958   PROTEINUR NEGATIVE 05/08/2019 1958   NITRITE NEGATIVE 05/08/2019 1958   LEUKOCYTESUR NEGATIVE 05/08/2019 1958   LEUKOCYTESUR Negative 10/27/2013 1403     RADIOLOGY: No results found.  EKG: Orders placed or performed during the hospital encounter of 05/08/19  . ED EKG  . ED EKG  . EKG 12-Lead  . EKG 12-Lead  . 12 lead EKG  . 12 lead EKG    IMPRESSION AND PLAN: Patient is a 85 year old white female with diastolic CHF as well as chronic kidney disease as well as moderate to severe aortic stenosis presenting with shortness of breath  1.  Acute on chronic diastolic CHF in setting of moderate to severe aortic  stenosis As per nephrology recommendations we will treat with IV Lasix I have consulted both nephrology and cardiology  2.  Acute kidney injury on chronic kidney disease likely due to fluid overload nephrology will be following the patient  3.  Essential hypertension due to low borderline blood pressure will have to hold her antihypertensive   4.  Hyperlipidemia continue Pravachol  5.  Moderate to severe aortic stenosis further management per cardiology   6.  Miscellaneous heparin for DVT prophylaxis   All the records are reviewed and case discussed with ED provider. Management plans discussed with the patient, family and they are in agreement.  CODE STATUS: Code Status History    Date Active Date Inactive Code Status Order ID Comments User Context   05/09/2019 0002 05/10/2019 1441 Full Code [id number]  Mayer Camel, NP Inpatient   10/16/2017 1237 10/20/2017 1859 Full Code [id number]  Gladstone Lighter, MD Inpatient   Advance Care Planning Activity       TOTAL TIME TAKING CARE OF THIS PATIENT: 55 minutes.    Dustin Flock M.D on 05/30/2019 at 11:35 AM  Between 7am to 6pm - Pager - [phone]  After 6pm go to [email or web address] - Proofreader  [employer] Office  [phone]  CC: Primary care physician; Chrismon, Vickki Muff, PA

## 2019-05-30 NOTE — Progress Notes (Signed)
Advanced care plan.  Purpose of the Encounter: CODE STATUS  Parties in Attendance: Patient herself and daughter  Patient's Decision Capacity: Intact  Subjective/Patient's story:  Patient is 85 year old with history of diastolic CHF and chronic kidney disease presenting with worsening of renal failure and generalized fluid retention.  Objective/Medical story I discussed with the patient and her daughter regarding overall condition and discussed with him regarding her desires for cardiac and pulmonary resuscitation.  Patient states that she does not want to be on the mechanical ventilator.  Would want cardiac resuscitation as well as BiPAP if needed.   Goals of care determination:   Limited code with DO NOT INTUBATE  CODE STATUS: DO NOT INTUBATE   Time spent discussing advanced care planning: 16 minutes

## 2019-05-31 ENCOUNTER — Inpatient Hospital Stay
Admission: AD | Disposition: A | Discharge status: Home / Self Care | Payer: Self-pay | Source: Ambulatory Visit | Attending: Internal Medicine

## 2019-05-31 DIAGNOSIS — N189 Chronic kidney disease, unspecified: Secondary | ICD-10-CM

## 2019-05-31 DIAGNOSIS — Z87891 Personal history of nicotine dependence: Secondary | ICD-10-CM

## 2019-05-31 DIAGNOSIS — I2510 Atherosclerotic heart disease of native coronary artery without angina pectoris: Secondary | ICD-10-CM

## 2019-05-31 DIAGNOSIS — I739 Peripheral vascular disease, unspecified: Secondary | ICD-10-CM

## 2019-05-31 DIAGNOSIS — J449 Chronic obstructive pulmonary disease, unspecified: Secondary | ICD-10-CM

## 2019-05-31 DIAGNOSIS — N179 Acute kidney failure, unspecified: Secondary | ICD-10-CM

## 2019-05-31 DIAGNOSIS — I129 Hypertensive chronic kidney disease with stage 1 through stage 4 chronic kidney disease, or unspecified chronic kidney disease: Secondary | ICD-10-CM

## 2019-05-31 HISTORY — PX: TEMPORARY DIALYSIS CATHETER: CATH118312

## 2019-05-31 LAB — CBC
HCT: 25.6 % — ABNORMAL LOW (ref 36.0–46.0)
Hemoglobin: 8.2 g/dL — ABNORMAL LOW (ref 12.0–15.0)
MCH: 29.8 pg (ref 26.0–34.0)
MCHC: 32 g/dL (ref 30.0–36.0)
MCV: 93.1 fL (ref 80.0–100.0)
Platelets: 150 10*3/uL (ref 150–400)
RBC: 2.75 MIL/uL — ABNORMAL LOW (ref 3.87–5.11)
RDW: 14.6 % (ref 11.5–15.5)
WBC: 4.3 10*3/uL (ref 4.0–10.5)
nRBC: 0 % (ref 0.0–0.2)

## 2019-05-31 LAB — BASIC METABOLIC PANEL
Anion gap: 13 (ref 5–15)
BUN: 115 mg/dL — ABNORMAL HIGH (ref 8–23)
CO2: 19 mmol/L — ABNORMAL LOW (ref 22–32)
Calcium: 8.5 mg/dL — ABNORMAL LOW (ref 8.9–10.3)
Chloride: 93 mmol/L — ABNORMAL LOW (ref 98–111)
Creatinine, Ser: 6.58 mg/dL — ABNORMAL HIGH (ref 0.44–1.00)
GFR calc Af Amer: 6 mL/min — ABNORMAL LOW (ref >60)
GFR calc non Af Amer: 5 mL/min — ABNORMAL LOW (ref >60)
Glucose, Bld: 109 mg/dL — ABNORMAL HIGH (ref 70–99)
Potassium: 5.1 mmol/L (ref 3.5–5.1)
Sodium: 125 mmol/L — ABNORMAL LOW (ref 135–145)

## 2019-05-31 SURGERY — TEMPORARY DIALYSIS CATHETER
Anesthesia: LOCAL

## 2019-05-31 MED ORDER — LIDOCAINE HCL (PF) 1 % IJ SOLN
5.0000 mL | INTRAMUSCULAR | Status: DC | PRN
  Filled 2019-05-31: qty 5

## 2019-05-31 MED ORDER — HEPARIN SODIUM (PORCINE) 1000 UNIT/ML DIALYSIS: 1000.0000 [IU] | INTRAMUSCULAR | Status: DC | PRN

## 2019-05-31 MED ORDER — ALTEPLASE 2 MG IJ SOLR: 2.0000 mg | Freq: Once | INTRAMUSCULAR | Status: DC | PRN

## 2019-05-31 MED ORDER — PENTAFLUOROPROP-TETRAFLUOROETH EX AERO
1.0000 "application " | INHALATION_SPRAY | CUTANEOUS | Status: DC | PRN
  Filled 2019-05-31: qty 30

## 2019-05-31 MED ORDER — SODIUM CHLORIDE 0.9 % IV SOLN: 100.0000 mL | INTRAVENOUS | Status: DC | PRN

## 2019-05-31 MED ORDER — LIDOCAINE-PRILOCAINE 2.5-2.5 % EX CREA
1.0000 "application " | TOPICAL_CREAM | CUTANEOUS | Status: DC | PRN
  Filled 2019-05-31: qty 5

## 2019-05-31 MED ORDER — GUAIFENESIN-DM 100-10 MG/5ML PO SYRP
5.0000 mL | ORAL_SOLUTION | ORAL | Status: DC | PRN
  Administered 2019-05-31 – 2019-06-01 (×2): 5 mL via ORAL
  Filled 2019-05-31 (×2): qty 5

## 2019-05-31 MED ORDER — CHLORHEXIDINE GLUCONATE CLOTH 2 % EX PADS
6.0000 | MEDICATED_PAD | Freq: Every day | CUTANEOUS | Status: DC
  Administered 2019-06-01 – 2019-06-04 (×2): 6 via TOPICAL

## 2019-05-31 SURGICAL SUPPLY — 1 items: KIT CATH DIALYSIS TRI 24X13 (CATHETERS) ×2 IMPLANT

## 2019-05-31 NOTE — Progress Notes (Signed)
Pre HD Tx    05/31/19 1142  Hand-Off documentation  Report given to (Full Name) Beatris Ship, RN   Report received from (Full Name) Edwin Dada, RN   Vital Signs  Temp 97.6 F (36.4 C)  Temp Source Oral  Pulse Rate (!) 56  Pulse Rate Source Monitor  Resp 19  BP (!) 120/45  BP Location Right Arm  BP Method Automatic  Patient Position (if appropriate) Lying  Oxygen Therapy  SpO2 100 %  O2 Device Nasal Cannula  O2 Flow Rate (L/min) 2 L/min  Pulse Oximetry Type Continuous  Pain Assessment  Pain Scale 0-10  Pain Score 0  Dialysis Weight  Weight 82.6 kg  Time-Out for Hemodialysis  What Procedure? HD   Pt Identifiers(min of two) First/Last Name;MRN/Account#  Correct Site? Yes  Correct Side? Yes  Correct Procedure? Yes  Consents Verified? Yes  Rad Studies Available? N/A  Safety Precautions Reviewed? Yes  Engineer, civil (consulting) Number 7  Station Number 3  UF/Alarm Test Passed  Conductivity: Meter 13.6  Conductivity: Machine  13.5  pH 7.4  Reverse Osmosis Main  Normal Saline Lot Number [id number]  Dialyzer Lot Number [id number]  Disposable Set Lot Number [id number]  Machine Temperature 98.6 F (37 C)  Musician and Audible Yes  Blood Lines Intact and Secured Yes  Pre Treatment Patient Checks  Vascular access used during treatment Catheter  HD catheter dressing before treatment WDL  Hepatitis B Surface Antigen Results Pending  Hemodialysis Consent Verified Yes  Hemodialysis Standing Orders Initiated Yes  ECG (Telemetry) Monitor On Yes  Prime Ordered Normal Saline  Length of  DialysisTreatment -hour(s) 1.5 Hour(s)  Dialysis Treatment Comments Na 140  Dialyzer Elisio 17H NR  Dialysate 2K;2.5 Ca  Dialysis Anticoagulant None  Blood Flow Rate Ordered 150 mL/min  Ultrafiltration Goal 0 Liters  Pre Treatment Labs Hepatitis B Surface Antigen;Phosphorus  Dialysis Blood Pressure Support Ordered Normal Saline  Education / Care Plan  Dialysis Education Provided Yes   Documented Education in Care Plan Yes

## 2019-05-31 NOTE — Progress Notes (Signed)
[facility]  ROUNDING NOTE   Subjective:  Patient seen at bedside. Well-known to [place]. She was seen in the office by Dr. Candiss Norse yesterday. Patient started on Lasix drip but did not make very much urine. Urine output was only 50 cc yesterday. Therefore we discussed renal placement therapy and family was agreeable to proceeding with this.   Objective:  Vital signs in last 24 hours:  Temp:  [97.5 F (36.4 C)-98.3 F (36.8 C)] 97.6 F (36.4 C) (07/01 1142) Pulse Rate:  [55-72] 66 (07/01 1324) Resp:  [8-25] 24 (07/01 1324) BP: (104-126)/(38-57) 122/51 (07/01 1324) SpO2:  [91 %-100 %] 98 % (07/01 1143) Weight:  [82.6 kg] 82.6 kg (07/01 1142)  Weight change:  Filed Weights   05/30/19 1126 05/31/19 0541 05/31/19 1142  Weight: 81.4 kg 82.6 kg 82.6 kg    Intake/Output: I/O last 3 completed shifts: In: 80 [I.V.:80] Out: 28 [Urine:50]   Intake/Output this shift:  Total I/O In: -  Out: 100 [Urine:100]  Physical Exam: General: No acute distress  Head: Normocephalic, atraumatic. Moist oral mucosal membranes  Eyes: Anicteric  Neck: Supple, trachea midline  Lungs:  Clear to auscultation, normal effort  Heart: S1S2 no rubs 2/6 SEM  Abdomen:  Soft, nontender, bowel sounds present  Extremities: 2+ peripheral edema.  Neurologic: Awake, alert, following commands  Skin: No lesions  Access: Right temporary femoral dialysis catheter    Basic Metabolic Panel: Recent Labs  Lab 05/30/19 1252 05/31/19 0500  NA 125* 125*  K 5.1 5.1  CL 91* 93*  CO2 17* 19*  GLUCOSE 124* 109*  BUN 113* 115*  CREATININE 6.16*  6.20* 6.58*  CALCIUM 9.0 8.5*    Liver Function Tests: Recent Labs  Lab 05/30/19 1252  AST 32  ALT 18  ALKPHOS 59  BILITOT 0.9  PROT 6.5  ALBUMIN 3.9   No results for input(s): LIPASE, AMYLASE in the last 168 hours. No results for input(s): AMMONIA in the last 168 hours.  CBC: Recent Labs  Lab 05/30/19 1252 05/31/19 0500  WBC 5.1 4.3  HGB  8.7* 8.2*  HCT 26.7* 25.6*  MCV 92.7 93.1  PLT 156 150    Cardiac Enzymes: No results for input(s): CKTOTAL, CKMB, CKMBINDEX, TROPONINI in the last 168 hours.  BNP: Invalid input(s): POCBNP  CBG: No results for input(s): GLUCAP in the last 168 hours.  Microbiology: Results for orders placed or performed during the hospital encounter of 05/30/19  SARS Coronavirus 2 (CEPHEID - Performed in [place] hospital lab), Hosp Order     Status: None   Collection Time: 05/30/19  1:15 PM   Specimen: Nasopharyngeal Swab  Result Value Ref Range Status   SARS Coronavirus 2 NEGATIVE NEGATIVE Final    Comment: (NOTE) If result is NEGATIVE SARS-CoV-2 target nucleic acids are NOT DETECTED. The SARS-CoV-2 RNA is generally detectable in upper and lower  respiratory specimens during the acute phase of infection. The lowest  concentration of SARS-CoV-2 viral copies this assay can detect is 250  copies / mL. A negative result does not preclude SARS-CoV-2 infection  and should not be used as the sole basis for treatment or other  patient management decisions.  A negative result may occur with  improper specimen collection / handling, submission of specimen other  than nasopharyngeal swab, presence of viral mutation(s) within the  areas targeted by this assay, and inadequate number of viral copies  (<250 copies / mL). A negative result must be combined with clinical  observations, patient history, and epidemiological information. If result is POSITIVE SARS-CoV-2 target nucleic acids are DETECTED. The SARS-CoV-2 RNA is generally detectable in upper and lower  respiratory specimens dur ing the acute phase of infection.  Positive  results are indicative of active infection with SARS-CoV-2.  Clinical  correlation with patient history and other diagnostic information is  necessary to determine patient infection status.  Positive results do  not rule out bacterial infection or co-infection with other  viruses. If result is PRESUMPTIVE POSTIVE SARS-CoV-2 nucleic acids MAY BE PRESENT.   A presumptive positive result was obtained on the submitted specimen  and confirmed on repeat testing.  While 2019 novel coronavirus  (SARS-CoV-2) nucleic acids may be present in the submitted sample  additional confirmatory testing may be necessary for epidemiological  and / or clinical management purposes  to differentiate between  SARS-CoV-2 and other Sarbecovirus currently known to infect humans.  If clinically indicated additional testing with an alternate test  methodology [phone]) is advised. The SARS-CoV-2 RNA is generally  detectable in upper and lower respiratory sp ecimens during the acute  phase of infection. The expected result is Negative. Fact Sheet for Patients:  StrictlyIdeas.no Fact Sheet for Healthcare Providers: BankingDealers.co.za This test is not yet approved or cleared by the [place] FDA and has been authorized for detection and/or diagnosis of SARS-CoV-2 by FDA under an Emergency Use Authorization (EUA).  This EUA will remain in effect (meaning this test can be used) for the duration of the COVID-19 declaration under Section 564(b)(1) of the Act, 21 U.S.C. section 360bbb-3(b)(1), unless the authorization is terminated or revoked sooner. Performed at [facility], [address] [place], [place] [address]     Coagulation Studies: No results for input(s): LABPROT, INR in the last 72 hours.  Urinalysis: No results for input(s): COLORURINE, LABSPEC, PHURINE, GLUCOSEU, HGBUR, BILIRUBINUR, KETONESUR, PROTEINUR, UROBILINOGEN, NITRITE, LEUKOCYTESUR in the last 72 hours.  Invalid input(s): APPERANCEUR    Imaging: No results found.   Medications:   . sodium chloride     . allopurinol  100 mg Oral BID  . aspirin EC  81 mg Oral Daily  . Chlorhexidine Gluconate Cloth  6 each Topical Q0600  . colchicine  0.6  mg Oral Daily  . heparin  5,000 Units Subcutaneous Q8H  . loratadine  10 mg Oral Daily  . pravastatin  20 mg Oral QHS  . sodium chloride flush  3 mL Intravenous Q12H   sodium chloride, acetaminophen, ondansetron (ZOFRAN) IV, sodium chloride flush  Assessment/ Plan:  85 y.o. female  with gout, severe peripheral vascular disease status post femoral bypass 15 years ago, several angioplasties, right leg DVT, hearing loss, hypertension, left eye macular degeneration, coronary disease with stent and angioplasty.  1.  Acute renal failure/chronic kidney disease stage IV baseline creatinine 2.4/proteinuria.  Patient has worsening renal function trend.  Minimal urine output over the preceding 24 hours.  Therefore we have proceeded with renal placement therapy.  Appreciate vascular surgery assistance.  In addition we will check ultrasound to make sure there is no underlying obstruction.  Also check SPEP, UPEP, ANA, ANCA antibodies, GBM antibodies, C3, C4 for additional work-up now.  2.  Anemia of chronic kidney disease.  Hemoglobin 8.2..  Hold off on Epogen at this time.  3.  Secondary hyperparathyroidism.  Monitor bone metabolism parameters over the course of the hospitalization.   LOS: 1 Irlanda Croghan 7/1/20203:40 PM

## 2019-05-31 NOTE — Progress Notes (Signed)
Following this patient for hemodialysis outpatient placement. Education completed. Per daughter, [name], patient would like to go to [facility]. Need Hep B and CXR or PPD to result.   Elvera Bicker Dialysis Coordinator [phone]

## 2019-05-31 NOTE — Op Note (Signed)
  OPERATIVE NOTE   PROCEDURE: 1. Ultrasound guidance for vascular access right femoral vein 2. Placement of a 30 cm triple lumen dialysis catheter right femoral vein  PRE-OPERATIVE DIAGNOSIS: 1. Acute renal failure   POST-OPERATIVE DIAGNOSIS: Same  SURGEON: Leotis Pain, MD  ASSISTANT(S): None  ANESTHESIA: local  ESTIMATED BLOOD LOSS: 5 cc  FINDING(S): 1. None  SPECIMEN(S): None  INDICATIONS:  Patient is a 85 y.o.female who presents with renal failure and need for immediate dialysis.  Risks and benefits were discussed, and informed consent was obtained.  DESCRIPTION: After obtaining full informed written consent, the patient was laid flat in the bed. The right groin was sterilely prepped and draped in a sterile surgical field was created. The right femoral vein was visualized with ultrasound and found to be widely patent. It was then accessed under direct guidance without difficulty with a Seldinger needle and a permanent image was recorded. A J-wire was then placed. After skin nick and dilatation, a 30 cm triple lumen dialysis catheter was placed over the wire and the wire was removed. The lumens withdrew dark red nonpulsatile blood and flushed easily with sterile saline. The catheter was secured to the skin with 3 nylon sutures. Sterile dressing was placed.  COMPLICATIONS: None  CONDITION: Stable  Leotis Pain 05/31/2019 11:27 AM  This note was created with Dragon Medical transcription system. Any errors in dictation are purely unintentional.

## 2019-05-31 NOTE — Progress Notes (Signed)
Post HD Assessment    05/31/19 1332  Neurological  Level of Consciousness Alert  Orientation Level Oriented X4  Respiratory  Respiratory Pattern Regular;Labored  Chest Assessment Chest expansion symmetrical  Bilateral Breath Sounds Diminished  Cough Dry  Cardiac  Pulse Irregular  Heart Sounds S1, S2  Jugular Venous Distention (JVD) No  ECG Monitor Yes  Cardiac Rhythm NSR;SB;Atrial fibrillation  Vascular  R Radial Pulse +2  L Radial Pulse +2  Edema Generalized  Generalized Edema +1  Psychosocial  Psychosocial (WDL) WDL

## 2019-05-31 NOTE — Consult Note (Signed)
[facility] Vascular Consult Note  MRN : [id number]  Lori Johnson is a 85 y.o. (05-Jul-1930) female who presents with chief complaint of No chief complaint on file. [name]  History of Present Illness: Patient is admitted to the hospital with shortness of breath and lethargy and has acute renal failure.  Patient has some chronic kidney disease with a creatinine nearing 2 back in 2018.  Her creatinine is currently greater than 6.  The patient reports difficulty lying flat and shortness of breath with minimal activity and even at rest.  She does have some swelling and generalized lethargy.  She is awake and alert and seems to understand the gravity of the situation.  The patient is critically ill with her acute renal failure and her large number of medical comorbidities as described below. The nephrology service has decided to initiate dialysis at this time, and we are asked to place a temporary dialysis catheter for immediate dialysis use.    Current Facility-Administered Medications  Medication Dose Route Frequency Provider Last Rate Last Dose  . [MAR Hold] 0.9 %  sodium chloride infusion  250 mL Intravenous PRN Dustin Flock, MD      . Doug Sou Hold] acetaminophen (TYLENOL) tablet 650 mg  650 mg Oral Q4H PRN Dustin Flock, MD      . Doug Sou Hold] allopurinol (ZYLOPRIM) tablet 100 mg  100 mg Oral BID Dustin Flock, MD   100 mg at 05/30/19 2137  . [MAR Hold] aspirin EC tablet 81 mg  81 mg Oral Daily Dustin Flock, MD      . Doug Sou Hold] Chlorhexidine Gluconate Cloth 2 % PADS 6 each  6 each Topical Q0600 Lateef, Munsoor, MD      . Doug Sou Hold] colchicine tablet 0.6 mg  0.6 mg Oral Daily Dustin Flock, MD      . Doug Sou Hold] heparin injection 5,000 Units  5,000 Units Subcutaneous Q8H Dustin Flock, MD   5,000 Units at 05/31/19 0606  . [MAR Hold] loratadine (CLARITIN) tablet 10 mg  10 mg Oral Daily Dustin Flock, MD      . Doug Sou Hold] ondansetron [facility]) injection 4 mg  4 mg  Intravenous Q6H PRN Dustin Flock, MD      . Doug Sou Hold] pravastatin (PRAVACHOL) tablet 20 mg  20 mg Oral QHS Dustin Flock, MD   20 mg at 05/30/19 2137  . [MAR Hold] sodium chloride flush (NS) 0.9 % injection 3 mL  3 mL Intravenous Q12H Dustin Flock, MD   3 mL at 05/30/19 1309  . [MAR Hold] sodium chloride flush (NS) 0.9 % injection 3 mL  3 mL Intravenous PRN Dustin Flock, MD        Past Medical History:  Diagnosis Date  . Angiopathy, peripheral ([place])   . Arterial embolus and thrombosis ([place])   . ASCVD (arteriosclerotic cardiovascular disease)    with stent RCA  . Bilateral bunions   . Cancer ([place])    SCC removed off left leg  . Cervical lymphadenitis   . Chronic gout of left ankle due to renal impairment without tophus   . Chronic infection June 2014   Has chronic infection of right femoral graft with history of extensive debridement in June 2014. Was on oral Clindamycin for 3 weeks for culture positive for anerobes and staph lugdunensis. Vascular Surgeon at [facility] Remonia Richter, MD) recommended indefinite suppressive therapy with Cephalexin 500 mg qd and reassessment every 3 months. Will refill Cephalexin.)  . Chronic kidney disease   .  COPD (chronic obstructive pulmonary disease) ([place])   . CRF (chronic renal failure)   . GERD (gastroesophageal reflux disease)   . Hiatal hernia   . History of arterial bypass of lower extremity   . Hyperlipidemia   . Hypertension   . Hyperuricemia   . Macular degeneration   . Mass of right side of neck   . Nocturia   . OA (osteoarthritis)   . PVD (peripheral vascular disease) ([place])   . Renal disorder     Past Surgical History:  Procedure Laterality Date  . ABDOMINAL HYSTERECTOMY  1981  . arterial bypass right leg Right   . broken left foot Left 01/2002  . CHOLECYSTECTOMY    . CORONARY ANGIOPLASTY WITH STENT PLACEMENT    . stent in leg      Social History Social History   Tobacco Use  . Smoking status: Former Smoker    Types:  Cigarettes  . Smokeless tobacco: Never Used  . Tobacco comment: quit >30-40 years ago  Substance Use Topics  . Alcohol use: No  . Drug use: No    Family History Family History  Problem Relation Age of Onset  . Heart disease Mother   . Heart disease Father   No bleeding or clotting disorders  Allergies  Allergen Reactions  . Pletal [Cilostazol] Swelling  . Erythromycin Hives  . Codeine Nausea Only and Nausea And Vomiting  . Erythromycin Base Itching and Rash  . Omnicef [Cefdinir] Itching and Rash  . Penicillins Hives, Itching and Rash  . Sulfa Antibiotics Rash     REVIEW OF SYSTEMS (Negative unless checked)  Constitutional: [] Weight loss  [] Fever  [] Chills Cardiac: [] Chest pain   [] Chest pressure   [] Palpitations   [] Shortness of breath when laying flat   [x] Shortness of breath at rest   [x] Shortness of breath with exertion. Vascular:  [] Pain in legs with walking   [] Pain in legs at rest   [] Pain in legs when laying flat   [] Claudication   [] Pain in feet when walking  [] Pain in feet at rest  [] Pain in feet when laying flat   [] History of DVT   [] Phlebitis   [x] Swelling in legs   [] Varicose veins   [] Non-healing ulcers Pulmonary:   [] Uses home oxygen   [] Productive cough   [] Hemoptysis   [] Wheeze  [x] COPD   [] Asthma Neurologic:  [x] Dizziness  [] Blackouts   [] Seizures   [] History of stroke   [] History of TIA  [] Aphasia   [] Temporary blindness   [] Dysphagia   [] Weakness or numbness in arms   [] Weakness or numbness in legs Musculoskeletal:  [x] Arthritis   [] Joint swelling   [] Joint pain   [] Low back pain Hematologic:  [] Easy bruising  [] Easy bleeding   [] Hypercoagulable state   [x] Anemic  [] Hepatitis Gastrointestinal:  [] Blood in stool   [] Vomiting blood  [x] Gastroesophageal reflux/heartburn   [] Difficulty swallowing. Genitourinary:  [x] Chronic kidney disease   [x] Difficult urination  [] Frequent urination  [] Burning with urination   [] Blood in urine Skin:  [] Rashes   [] Ulcers    [] Wounds Psychological:  [] History of anxiety   []  History of major depression.      Physical Examination  Vitals:   05/31/19 0350 05/31/19 0541 05/31/19 0719 05/31/19 1048  BP: (!) 104/41  (!) 112/51 (!) 124/43  Pulse: (!) 56  (!) 57 (!) 57  Resp: 20  15 18   Temp: 98 F (36.7 C)  97.8 F (36.6 C) (!) 97.5 F (36.4 C)  TempSrc: Oral  Oral  Oral  SpO2: 98%  91% 97%  Weight:  82.6 kg    Height:       Body mass index is 32.25 kg/m. Gen: WD/WN Head: [place]/AT, No temporalis wasting Ear/Nose/Throat: Hearing grossly intact, nares w/o erythema or drainage Eyes: Sclera non-icteric, conjunctiva clear Neck: Supple, no nuchal rigidity.  No JVD.  Pulmonary:  Good air movement, some labored respirations at rest  Cardiac: RRR, no JVD Musculoskeletal: M/S 5/5 throughout.  Extremities without ischemic changes.  No deformity or atrophy. Moderate Edema in the lower extremities bilaterally Neurologic: Awake, alert, and seems oriented.  No obvious focal neurologic deficits Psychiatric: Insight appears to be fairly good.  Affect is normal for her medical condition Dermatologic: No rashes or ulcers noted.         CBC Lab Results  Component Value Date   WBC 4.3 05/31/2019   HGB 8.2 (L) 05/31/2019   HCT 25.6 (L) 05/31/2019   MCV 93.1 05/31/2019   PLT 150 05/31/2019    BMET    Component Value Date/Time   NA 125 (L) 05/31/2019 0500   NA 131 (L) 05/16/2019 1139   NA 139 12/06/2014 1828   K 5.1 05/31/2019 0500   K 4.4 12/06/2014 1828   CL 93 (L) 05/31/2019 0500   CL 107 12/06/2014 1828   CO2 19 (L) 05/31/2019 0500   CO2 25 12/06/2014 1828   GLUCOSE 109 (H) 05/31/2019 0500   GLUCOSE 106 (H) 12/06/2014 1828   BUN 115 (H) 05/31/2019 0500   BUN 60 (H) 05/16/2019 1139   BUN 33 (H) 12/06/2014 1828   CREATININE 6.58 (H) 05/31/2019 0500   CREATININE 1.98 (H) 10/29/2017 1104   CALCIUM 8.5 (L) 05/31/2019 0500   CALCIUM 8.9 12/06/2014 1828   GFRNONAA 5 (L) 05/31/2019 0500   GFRNONAA 22  (L) 10/29/2017 1104   GFRAA 6 (L) 05/31/2019 0500   GFRAA 26 (L) 10/29/2017 1104   Estimated Creatinine Clearance: 6.1 mL/min (A) (by C-G formula based on SCr of 6.58 mg/dL (H)).  COAG Lab Results  Component Value Date   INR 1.3 10/29/2017   INR 1.25 10/18/2017   INR 1.34 10/16/2017    Radiology Dg Chest Port 1 View  Result Date: 05/09/2019 CLINICAL DATA:  Follow-up infiltrates EXAM: PORTABLE CHEST 1 VIEW COMPARISON:  05/08/2019 FINDINGS: Cardiac shadow is stable. Aortic calcifications are again seen. Bibasilar opacities are again seen and stable. Some improved vascular congestion is noted. No sizable effusion is noted. No bony abnormality is seen. IMPRESSION: Persistent bibasilar infiltrates. Electronically Signed   By: Inez Catalina M.D.   On: 05/09/2019 07:49   Dg Chest Portable 1 View  Result Date: 05/08/2019 CLINICAL DATA:  85 year old female with increased shortness of breath. Test for COVID-19 pending. EXAM: PORTABLE CHEST 1 VIEW COMPARISON:  01/26/2019 and earlier. FINDINGS: Portable AP upright view at 1946 hours. Acute on chronic bilateral pulmonary interstitial opacity. Lung volumes are lower. Stable cardiac size and mediastinal contours. Visualized tracheal air column is within normal limits. No pneumothorax, pleural effusion or consolidation. Paucity of bowel gas in the upper abdomen. No acute osseous abnormality identified. IMPRESSION: Lower lung volumes with acute on chronic pulmonary interstitial opacity. Consider viral/atypical respiratory infection and pulmonary interstitial edema. Electronically Signed   By: Genevie Ann M.D.   On: 05/08/2019 20:07      Assessment/Plan 1. ARF.  This is superimposed on top of some chronic kidney disease.  We will proceed with temporary dialysis catheter placement at this time.  Risks and  benefits discussed with patient and/or family, and the catheter will be placed to allow immediate initiation of dialysis.  If the patient's renal function does  not improve throughout the hospital course, we will be happy to place a tunneled dialysis catheter for long term use prior to discharge.   2.  Coronary disease.  Cardiology has seen the patient.  She is at high risk of cardiopulmonary complications particularly without dialysis. 3.  Hypertension.  Likely an underlying cause of renal insufficiency and blood pressure control important in reducing the progression of atherosclerotic disease. On appropriate oral medications. 4.  COPD.  Respiratory issues worsened with volume overload from renal failure.  Inhalers as needed.  Hopefully some volume removal with dialysis will improve her respiratory status 5.  Peripheral artery disease.  No recent limb threatening symptoms.  Does have a history of interventions and bypass to the right leg.  We would be happy to follow-up and evaluate this as an outpatient if she desires.    Leotis Pain, MD  05/31/2019 11:31 AM

## 2019-05-31 NOTE — Progress Notes (Signed)
Pre HD Assessment    05/31/19 1140  Neurological  Level of Consciousness Alert  Orientation Level Oriented X4  Respiratory  Respiratory Pattern Regular;Labored  Chest Assessment Chest expansion symmetrical  Bilateral Breath Sounds Diminished  Cough None  Cardiac  Pulse Irregular  Heart Sounds S1, S2  Jugular Venous Distention (JVD) No  ECG Monitor Yes  Cardiac Rhythm NSR;SB;Atrial fibrillation  Vascular  R Radial Pulse +2  L Radial Pulse +2  Edema Generalized  Generalized Edema +1  Psychosocial  Psychosocial (WDL) WDL

## 2019-05-31 NOTE — Progress Notes (Signed)
[place] [facility]                                                                                                                                                                                  Patient Demographics   Lori Johnson, is a 85 y.o. female, DOB - [birth year], GEX:[id number]  Admit date - 05/30/2019   Admitting Physician Gladstone Lighter, MD  Outpatient Primary MD for the patient is Chrismon, Vickki Muff, PA   LOS - 1  Subjective: Patient's renal function has worsened.  She is not making much urine.  Plan is for her to have a temporary catheter    Review of Systems:   CONSTITUTIONAL: No documented fever. No fatigue, weakness. No weight gain, no weight loss.  EYES: No blurry or double vision.  ENT: No tinnitus. No postnasal drip. No redness of the oropharynx.  RESPIRATORY: No cough, no wheeze, no hemoptysis.  Positive dyspnea.  CARDIOVASCULAR: No chest pain. No orthopnea. No palpitations. No syncope.  GASTROINTESTINAL: No nausea, no vomiting or diarrhea. No abdominal pain. No melena or hematochezia.  GENITOURINARY: No dysuria or hematuria.  ENDOCRINE: No polyuria or nocturia. No heat or cold intolerance.  HEMATOLOGY: No anemia. No bruising. No bleeding.  INTEGUMENTARY: No rashes. No lesions.  MUSCULOSKELETAL: No arthritis. No swelling. No gout.  NEUROLOGIC: No numbness, tingling, or ataxia. No seizure-type activity.  PSYCHIATRIC: No anxiety. No insomnia. No ADD.    Vitals:   Vitals:   05/31/19 1300 05/31/19 1315 05/31/19 1321 05/31/19 1324  BP: (!) 109/57 (!) 109/46 (!) 109/43 (!) 122/51  Pulse: 64 72 72 66  Resp: 20 (!) 21 (!) 25 (!) 24  Temp:      TempSrc:      SpO2:      Weight:      Height:        Wt Readings from Last 3 Encounters:  05/31/19 82.6 kg  05/18/19 79.4 kg  05/16/19 80.7 kg     Intake/Output Summary (Last 24 hours) at 05/31/2019 1442 Last data filed at 05/31/2019 1005 Gross per 24 hour  Intake 80.04 ml  Output  150 ml  Net -69.96 ml    Physical Exam:   GENERAL: Pleasant-appearing in no apparent distress.  HEAD, EYES, EARS, NOSE AND THROAT: Atraumatic, normocephalic. Extraocular muscles are intact. Pupils equal and reactive to light. Sclerae anicteric. No conjunctival injection. No oro-pharyngeal erythema.  NECK: Supple. There is no jugular venous distention. No bruits, no lymphadenopathy, no thyromegaly.  HEART: Regular rate and rhythm,. No murmurs, no rubs, no clicks.  LUNGS: Clear to auscultation bilaterally. No rales or rhonchi. No wheezes.  ABDOMEN: Soft, flat, nontender, nondistended.  Has good bowel sounds. No hepatosplenomegaly appreciated.  EXTREMITIES: No evidence of any cyanosis, clubbing, or positive peripheral edema.  +2 pedal and radial pulses bilaterally.  NEUROLOGIC: The patient is alert, awake, and oriented x3 with no focal motor or sensory deficits appreciated bilaterally.  SKIN: Moist and warm with no rashes appreciated.  Psych: Not anxious, depressed LN: No inguinal LN enlargement    Antibiotics   Anti-infectives (From admission, onward)   None      Medications   Scheduled Meds: . allopurinol  100 mg Oral BID  . aspirin EC  81 mg Oral Daily  . Chlorhexidine Gluconate Cloth  6 each Topical Q0600  . colchicine  0.6 mg Oral Daily  . heparin  5,000 Units Subcutaneous Q8H  . loratadine  10 mg Oral Daily  . pravastatin  20 mg Oral QHS  . sodium chloride flush  3 mL Intravenous Q12H   Continuous Infusions: . sodium chloride     PRN Meds:.sodium chloride, acetaminophen, ondansetron (ZOFRAN) IV, sodium chloride flush   Data Review:   Micro Results Recent Results (from the past 240 hour(s))  SARS Coronavirus 2 (CEPHEID - Performed in [place] hospital lab), Hosp Order     Status: None   Collection Time: 05/30/19  1:15 PM   Specimen: Nasopharyngeal Swab  Result Value Ref Range Status   SARS Coronavirus 2 NEGATIVE NEGATIVE Final    Comment: (NOTE) If result is  NEGATIVE SARS-CoV-2 target nucleic acids are NOT DETECTED. The SARS-CoV-2 RNA is generally detectable in upper and lower  respiratory specimens during the acute phase of infection. The lowest  concentration of SARS-CoV-2 viral copies this assay can detect is 250  copies / mL. A negative result does not preclude SARS-CoV-2 infection  and should not be used as the sole basis for treatment or other  patient management decisions.  A negative result may occur with  improper specimen collection / handling, submission of specimen other  than nasopharyngeal swab, presence of viral mutation(s) within the  areas targeted by this assay, and inadequate number of viral copies  (<250 copies / mL). A negative result must be combined with clinical  observations, patient history, and epidemiological information. If result is POSITIVE SARS-CoV-2 target nucleic acids are DETECTED. The SARS-CoV-2 RNA is generally detectable in upper and lower  respiratory specimens dur ing the acute phase of infection.  Positive  results are indicative of active infection with SARS-CoV-2.  Clinical  correlation with patient history and other diagnostic information is  necessary to determine patient infection status.  Positive results do  not rule out bacterial infection or co-infection with other viruses. If result is PRESUMPTIVE POSTIVE SARS-CoV-2 nucleic acids MAY BE PRESENT.   A presumptive positive result was obtained on the submitted specimen  and confirmed on repeat testing.  While 2019 novel coronavirus  (SARS-CoV-2) nucleic acids may be present in the submitted sample  additional confirmatory testing may be necessary for epidemiological  and / or clinical management purposes  to differentiate between  SARS-CoV-2 and other Sarbecovirus currently known to infect humans.  If clinically indicated additional testing with an alternate test  methodology [id number]) is advised. The SARS-CoV-2 RNA is generally  detectable  in upper and lower respiratory sp ecimens during the acute  phase of infection. The expected result is Negative. Fact Sheet for Patients:  StrictlyIdeas.no Fact Sheet for Healthcare Providers: BankingDealers.co.za This test is not yet approved or cleared by the [place] FDA and has been authorized for detection  and/or diagnosis of SARS-CoV-2 by FDA under an Emergency Use Authorization (EUA).  This EUA will remain in effect (meaning this test can be used) for the duration of the COVID-19 declaration under Section 564(b)(1) of the Act, 21 U.S.C. section 360bbb-3(b)(1), unless the authorization is terminated or revoked sooner. Performed at [facility], [address], [place], [place] [address]     Radiology Reports Dg Chest [place] 1 View  Result Date: 05/09/2019 CLINICAL DATA:  Follow-up infiltrates EXAM: PORTABLE CHEST 1 VIEW COMPARISON:  05/08/2019 FINDINGS: Cardiac shadow is stable. Aortic calcifications are again seen. Bibasilar opacities are again seen and stable. Some improved vascular congestion is noted. No sizable effusion is noted. No bony abnormality is seen. IMPRESSION: Persistent bibasilar infiltrates. Electronically Signed   By: Inez Catalina M.D.   On: 05/09/2019 07:49   Dg Chest Portable 1 View  Result Date: 05/08/2019 CLINICAL DATA:  85 year old female with increased shortness of breath. Test for COVID-19 pending. EXAM: PORTABLE CHEST 1 VIEW COMPARISON:  01/26/2019 and earlier. FINDINGS: Portable AP upright view at 1946 hours. Acute on chronic bilateral pulmonary interstitial opacity. Lung volumes are lower. Stable cardiac size and mediastinal contours. Visualized tracheal air column is within normal limits. No pneumothorax, pleural effusion or consolidation. Paucity of bowel gas in the upper abdomen. No acute osseous abnormality identified. IMPRESSION: Lower lung volumes with acute on chronic pulmonary interstitial  opacity. Consider viral/atypical respiratory infection and pulmonary interstitial edema. Electronically Signed   By: Genevie Ann M.D.   On: 05/08/2019 20:07     CBC Recent Labs  Lab 05/30/19 1252 05/31/19 0500  WBC 5.1 4.3  HGB 8.7* 8.2*  HCT 26.7* 25.6*  PLT 156 150  MCV 92.7 93.1  MCH 30.2 29.8  MCHC 32.6 32.0  RDW 14.5 14.6    Chemistries  Recent Labs  Lab 05/30/19 1252 05/31/19 0500  NA 125* 125*  K 5.1 5.1  CL 91* 93*  CO2 17* 19*  GLUCOSE 124* 109*  BUN 113* 115*  CREATININE 6.16*  6.20* 6.58*  CALCIUM 9.0 8.5*  AST 32  --   ALT 18  --   ALKPHOS 59  --   BILITOT 0.9  --    ------------------------------------------------------------------------------------------------------------------ estimated creatinine clearance is 6.1 mL/min (A) (by C-G formula based on SCr of 6.58 mg/dL (H)). ------------------------------------------------------------------------------------------------------------------ No results for input(s): HGBA1C in the last 72 hours. ------------------------------------------------------------------------------------------------------------------ No results for input(s): CHOL, HDL, LDLCALC, TRIG, CHOLHDL, LDLDIRECT in the last 72 hours. ------------------------------------------------------------------------------------------------------------------ No results for input(s): TSH, T4TOTAL, T3FREE, THYROIDAB in the last 72 hours.  Invalid input(s): FREET3 ------------------------------------------------------------------------------------------------------------------ No results for input(s): VITAMINB12, FOLATE, FERRITIN, TIBC, IRON, RETICCTPCT in the last 72 hours.  Coagulation profile No results for input(s): INR, PROTIME in the last 168 hours.  No results for input(s): DDIMER in the last 72 hours.  Cardiac Enzymes No results for input(s): CKMB, TROPONINI, MYOGLOBIN in the last 168 hours.  Invalid input(s):  CK ------------------------------------------------------------------------------------------------------------------ Invalid input(s): [place]   Patient is a 85 year old white female with diastolic CHF as well as chronic kidney disease as well as moderate to severe aortic stenosis presenting with shortness of breath  1.  Acute on chronic diastolic CHF in setting of moderate to severe aortic stenosis Seen by cardiology supportive care  2.  Acute kidney injury on chronic kidney disease not much urine output patient to be started on hemodialysis today   3.  Essential hypertension continue to monitor for now   4.  Hyperlipidemia continue Pravachol  5.  Moderate to severe aortic stenosis further management per cardiology   6.  Miscellaneous heparin for DVT prophylaxis      Code Status Orders  (From admission, onward)         Start     Ordered   05/30/19 1159  Limited resuscitation (code)  Continuous    Question Answer Comment  In the event of cardiac or respiratory ARREST: Initiate Code Blue, Call Rapid Response Yes   In the event of cardiac or respiratory ARREST: Perform CPR Yes   In the event of cardiac or respiratory ARREST: Perform Intubation/Mechanical Ventilation No   In the event of cardiac or respiratory ARREST: Use NIPPV/BiPAp only if indicated Yes   In the event of cardiac or respiratory ARREST: Administer ACLS medications if indicated Yes   In the event of cardiac or respiratory ARREST: Perform Defibrillation or Cardioversion if indicated Yes      05/30/19 1158        Code Status History    Date Active Date Inactive Code Status Order ID Comments User Context   05/09/2019 0002 05/10/2019 1441 Full Code [id number]  Mayer Camel, NP Inpatient   10/16/2017 1237 10/20/2017 1859 Full Code [id number]  Gladstone Lighter, MD Inpatient   Advance Care Planning Activity           Consults cardiology  DVT Prophylaxis Heparin Lab  Results  Component Value Date   PLT 150 05/31/2019     Time Spent in minutes 35 minutes greater than 50% of time spent in care coordination and counseling patient regarding the condition and plan of care.   Dustin Flock M.D on 05/31/2019 at 2:42 PM  Between 7am to 6pm - Pager - [phone]  After 6pm go to [email or web address] - Proofreader  [employer]   Office  [phone]

## 2019-05-31 NOTE — Progress Notes (Signed)
HD Tx started    05/31/19 1143  Vital Signs  Pulse Rate (!) 56  Pulse Rate Source Monitor  Resp 19  BP (!) 124/44  BP Location Right Arm  BP Method Automatic  Patient Position (if appropriate) Lying  Oxygen Therapy  SpO2 98 %  O2 Device Nasal Cannula  O2 Flow Rate (L/min) 2 L/min  Pulse Oximetry Type Continuous  During Hemodialysis Assessment  Blood Flow Rate (mL/min) 150 mL/min  Arterial Pressure (mmHg) -40 mmHg  Venous Pressure (mmHg) 50 mmHg  Transmembrane Pressure (mmHg) 40 mmHg  Ultrafiltration Rate (mL/min) 180 mL/min  Dialysate Flow Rate (mL/min) 300 ml/min  Conductivity: Machine  13.8  HD Safety Checks Performed Yes  Dialysis Fluid Bolus Normal Saline  Bolus Amount (mL) 250 mL  Intra-Hemodialysis Comments Tx initiated

## 2019-05-31 NOTE — Progress Notes (Signed)
Patient has no acute event overnight. Lasix stopped at 2315 per [facility]. Patient has less than 50 mL overnight. Bladder scan of 92mL. Daughetr at bedside. Patient assisted as needed.Up in chair at 0630.

## 2019-06-01 ENCOUNTER — Other Ambulatory Visit (INDEPENDENT_AMBULATORY_CARE_PROVIDER_SITE_OTHER): Payer: Self-pay | Admitting: Vascular Surgery

## 2019-06-01 ENCOUNTER — Encounter
Admission: AD | Disposition: A | Discharge status: Home / Self Care | Payer: Self-pay | Source: Ambulatory Visit | Attending: Internal Medicine

## 2019-06-01 DIAGNOSIS — N186 End stage renal disease: Secondary | ICD-10-CM

## 2019-06-01 HISTORY — PX: DIALYSIS/PERMA CATHETER INSERTION: CATH118288

## 2019-06-01 LAB — BASIC METABOLIC PANEL
Anion gap: 14 (ref 5–15)
BUN: 95 mg/dL — ABNORMAL HIGH (ref 8–23)
CO2: 20 mmol/L — ABNORMAL LOW (ref 22–32)
Calcium: 8.7 mg/dL — ABNORMAL LOW (ref 8.9–10.3)
Chloride: 96 mmol/L — ABNORMAL LOW (ref 98–111)
Creatinine, Ser: 5.22 mg/dL — ABNORMAL HIGH (ref 0.44–1.00)
GFR calc Af Amer: 8 mL/min — ABNORMAL LOW (ref >60)
GFR calc non Af Amer: 7 mL/min — ABNORMAL LOW (ref >60)
Glucose, Bld: 96 mg/dL (ref 70–99)
Potassium: 4.4 mmol/L (ref 3.5–5.1)
Sodium: 130 mmol/L — ABNORMAL LOW (ref 135–145)

## 2019-06-01 LAB — PHOSPHORUS: Phosphorus: 6.9 mg/dL — ABNORMAL HIGH (ref 2.5–4.6)

## 2019-06-01 SURGERY — DIALYSIS/PERMA CATHETER INSERTION
Anesthesia: Choice

## 2019-06-01 MED ORDER — HYDROMORPHONE HCL 1 MG/ML IJ SOLN: 1.0000 mg | Freq: Once | INTRAMUSCULAR | Status: DC | PRN

## 2019-06-01 MED ORDER — FAMOTIDINE 20 MG PO TABS: 40.0000 mg | ORAL_TABLET | Freq: Once | ORAL | Status: DC | PRN

## 2019-06-01 MED ORDER — MIDAZOLAM HCL 5 MG/5ML IJ SOLN
INTRAMUSCULAR | Status: AC
  Filled 2019-06-01: qty 5

## 2019-06-01 MED ORDER — MIDAZOLAM HCL 2 MG/2ML IJ SOLN
INTRAMUSCULAR | Status: DC | PRN
  Administered 2019-06-01: 1 mg via INTRAVENOUS

## 2019-06-01 MED ORDER — COLCHICINE 0.6 MG PO TABS
0.3000 mg | ORAL_TABLET | ORAL | Status: DC
  Filled 2019-06-01 (×2): qty 0.5

## 2019-06-01 MED ORDER — CLINDAMYCIN PHOSPHATE 300 MG/50ML IV SOLN
300.0000 mg | Freq: Once | INTRAVENOUS | Status: AC
  Administered 2019-06-01: 16:00:00 300 mg via INTRAVENOUS

## 2019-06-01 MED ORDER — METHYLPREDNISOLONE SODIUM SUCC 125 MG IJ SOLR: 125.0000 mg | Freq: Once | INTRAMUSCULAR | Status: DC | PRN

## 2019-06-01 MED ORDER — COLCHICINE 0.6 MG PO TABS: 0.6000 mg | ORAL_TABLET | ORAL | Status: DC

## 2019-06-01 MED ORDER — DIPHENHYDRAMINE HCL 50 MG/ML IJ SOLN: 50.0000 mg | Freq: Once | INTRAMUSCULAR | Status: DC | PRN

## 2019-06-01 MED ORDER — EPOETIN ALFA 10000 UNIT/ML IJ SOLN
10000.0000 [IU] | INTRAMUSCULAR | Status: DC
  Administered 2019-06-01 – 2019-06-03 (×2): 10000 [IU] via INTRAVENOUS
  Filled 2019-06-01 (×2): qty 1

## 2019-06-01 MED ORDER — FENTANYL CITRATE (PF) 100 MCG/2ML IJ SOLN
INTRAMUSCULAR | Status: DC | PRN
  Administered 2019-06-01: 25 ug via INTRAVENOUS

## 2019-06-01 MED ORDER — HEPARIN SODIUM (PORCINE) 10000 UNIT/ML IJ SOLN
INTRAMUSCULAR | Status: AC
  Filled 2019-06-01: qty 1

## 2019-06-01 MED ORDER — SODIUM CHLORIDE 0.9 % IV SOLN
INTRAVENOUS | Status: DC
  Administered 2019-06-01: 16:00:00 via INTRAVENOUS

## 2019-06-01 MED ORDER — LIDOCAINE-EPINEPHRINE (PF) 1 %-1:200000 IJ SOLN
INTRAMUSCULAR | Status: AC
  Filled 2019-06-01: qty 30

## 2019-06-01 MED ORDER — ONDANSETRON HCL 4 MG/2ML IJ SOLN: 4.0000 mg | Freq: Four times a day (QID) | INTRAMUSCULAR | Status: DC | PRN

## 2019-06-01 MED ORDER — MIDAZOLAM HCL 2 MG/ML PO SYRP: 8.0000 mg | ORAL_SOLUTION | Freq: Once | ORAL | Status: DC | PRN

## 2019-06-01 MED ORDER — CLINDAMYCIN PHOSPHATE 300 MG/50ML IV SOLN
INTRAVENOUS | Status: AC
  Administered 2019-06-01: 16:00:00 300 mg via INTRAVENOUS
  Filled 2019-06-01: qty 50

## 2019-06-01 MED ORDER — TUBERCULIN PPD 5 UNIT/0.1ML ID SOLN
5.0000 [IU] | Freq: Once | INTRADERMAL | Status: AC
  Administered 2019-06-01: 5 [IU] via INTRADERMAL
  Filled 2019-06-01: qty 0.1

## 2019-06-01 MED ORDER — FENTANYL CITRATE (PF) 100 MCG/2ML IJ SOLN
INTRAMUSCULAR | Status: AC
  Filled 2019-06-01: qty 2

## 2019-06-01 MED ORDER — ALBUTEROL SULFATE (2.5 MG/3ML) 0.083% IN NEBU
2.5000 mg | INHALATION_SOLUTION | RESPIRATORY_TRACT | Status: DC | PRN
  Administered 2019-06-01 – 2019-06-04 (×6): 2.5 mg via RESPIRATORY_TRACT
  Filled 2019-06-01 (×6): qty 3

## 2019-06-01 SURGICAL SUPPLY — 11 items
BIOPATCH RED 1 DISK 7.0 (GAUZE/BANDAGES/DRESSINGS) ×1 IMPLANT
BIOPATCH RED 1IN DISK 7.0MM (GAUZE/BANDAGES/DRESSINGS) ×1
CATH CANNON HEMO 15FR 19 (HEMODIALYSIS SUPPLIES) ×2 IMPLANT
DERMABOND ADVANCED (GAUZE/BANDAGES/DRESSINGS) ×2
DERMABOND ADVANCED .7 DNX12 (GAUZE/BANDAGES/DRESSINGS) IMPLANT
PACK ANGIOGRAPHY (CUSTOM PROCEDURE TRAY) ×2 IMPLANT
SUT MNCRL 4-0 (SUTURE) ×2
SUT MNCRL 4-0 27XMFL (SUTURE) ×1
SUT PROLENE 0 CT 1 30 (SUTURE) ×2 IMPLANT
SUTURE MNCRL 4-0 27XMF (SUTURE) IMPLANT
TOWEL OR 17X26 4PK STRL BLUE (TOWEL DISPOSABLE) ×2 IMPLANT

## 2019-06-01 NOTE — Progress Notes (Signed)
Pre HD Tx Assessment    06/01/19 1645  Neurological  Level of Consciousness Alert  Orientation Level Oriented X4  Respiratory  Respiratory Pattern Dyspnea at rest;Labored  Bilateral Breath Sounds Expiratory wheezes  Cardiac  Cardiac Rhythm NSR  Vascular  R Radial Pulse +2  L Radial Pulse +2  Edema Right lower extremity;Left lower extremity;Generalized  Generalized Edema +1  RUE Edema +1  LUE Edema +1  RLE Edema +2  LLE Edema +2  Integumentary  Integumentary (WDL) X  Skin Color Appropriate for ethnicity  Skin Condition Dry  Skin Integrity Blister  Weeping Location Leg  Weeping Location Orientation Bilateral  Additional Integumentary Comments  (HD pt, second tx post PC plcmt)  Musculoskeletal  Musculoskeletal (WDL) X  Generalized Weakness Yes  Gastrointestinal  Bowel Sounds Assessment Active  GU Assessment  Genitourinary (WDL) X  Genitourinary Symptoms Oliguria;Other (Comment);External catheter (HD pt 2nd tx)  Urine Characteristics  Urine Color Yellow/straw  Urine Appearance Clear  Psychosocial  Psychosocial (WDL) WDL

## 2019-06-01 NOTE — Op Note (Signed)
OPERATIVE NOTE    PRE-OPERATIVE DIAGNOSIS: 1. ESRD   POST-OPERATIVE DIAGNOSIS: same as above  PROCEDURE: 1. Ultrasound guidance for vascular access to the right internal jugular vein 2. Fluoroscopic guidance for placement of catheter 3. Placement of a 19 cm tip to cuff tunneled hemodialysis catheter via the right internal jugular vein  SURGEON: Leotis Pain, MD  ANESTHESIA:  Local with Moderate conscious sedation for approximately 15 minutes using 1 mg of Versed and 25 mcg of Fentanyl  ESTIMATED BLOOD LOSS: 5 cc  FLUORO TIME: less than one minute  CONTRAST: none  FINDING(S): 1.  Patent right internal jugular vein  SPECIMEN(S):  None  INDICATIONS:   Lori Johnson is a 85 y.o.female who presents with renal failure.  The patient needs long term dialysis access for their ESRD, and a Permcath is necessary.  Risks and benefits are discussed and informed consent is obtained.    DESCRIPTION: After obtaining full informed written consent, the patient was brought back to the vascular suited. The patient's right neck and chest were sterilely prepped and draped in a sterile surgical field was created. Moderate conscious sedation was administered during a face to face encounter with the patient throughout the procedure with my supervision of the RN administering medicines and monitoring the patient's vital signs, pulse oximetry, telemetry and mental status throughout from the start of the procedure until the patient was taken to the recovery room.  The right internal jugular vein was visualized with ultrasound and found to be patent. It was then accessed under direct ultrasound guidance and a permanent image was recorded. A wire was placed. After skin nick and dilatation, the peel-away sheath was placed over the wire. I then turned my attention to an area under the clavicle. Approximately 1-2 fingerbreadths below the clavicle a small counterincision was created and tunneled from the subclavicular  incision to the access site. Using fluoroscopic guidance, a 19 centimeter tip to cuff tunneled hemodialysis catheter was selected, and tunneled from the subclavicular incision to the access site. It was then placed through the peel-away sheath and the peel-away sheath was removed. Using fluoroscopic guidance the catheter tips were parked in the right atrium. The appropriate distal connectors were placed. It withdrew blood well and flushed easily with heparinized saline and a concentrated heparin solution was then placed. It was secured to the chest wall with 2 Prolene sutures. The access incision was closed single 4-0 Monocryl. A 4-0 Monocryl pursestring suture was placed around the exit site. Sterile dressings were placed. The patient tolerated the procedure well and was taken to the recovery room in stable condition.  COMPLICATIONS: None  CONDITION: Stable  Leotis Pain, MD 06/01/2019 4:12 PM   This note was created with Dragon Medical transcription system. Any errors in dictation are purely unintentional.

## 2019-06-01 NOTE — Progress Notes (Signed)
[place] at [facility]                                                                                                                                                                                  Patient Demographics   Lori Johnson, is a 85 y.o. female, DOB - 1930-09-20, TFT:[id number]  Admit date - 05/30/2019   Admitting Physician Gladstone Lighter, MD  Outpatient Primary MD for the patient is Chrismon, Vickki Muff, PA   LOS - 2  Subjective: Patient's still short of breath according to her daughter making some urine    Review of Systems:   CONSTITUTIONAL: No documented fever. No fatigue, weakness. No weight gain, no weight loss.  EYES: No blurry or double vision.  ENT: No tinnitus. No postnasal drip. No redness of the oropharynx.  RESPIRATORY: No cough, no wheeze, no hemoptysis.  Positive dyspnea.  CARDIOVASCULAR: No chest pain. No orthopnea. No palpitations. No syncope.  GASTROINTESTINAL: No nausea, no vomiting or diarrhea. No abdominal pain. No melena or hematochezia.  GENITOURINARY: No dysuria or hematuria.  ENDOCRINE: No polyuria or nocturia. No heat or cold intolerance.  HEMATOLOGY: No anemia. No bruising. No bleeding.  INTEGUMENTARY: No rashes. No lesions.  MUSCULOSKELETAL: No arthritis. No swelling. No gout.  NEUROLOGIC: No numbness, tingling, or ataxia. No seizure-type activity.  PSYCHIATRIC: No anxiety. No insomnia. No ADD.    Vitals:   Vitals:   05/31/19 1324 05/31/19 1940 06/01/19 0341 06/01/19 0713  BP: (!) 122/51 (!) 126/56 (!) 124/53 (!) 116/49  Pulse: 66 70 72 66  Resp: (!) 24 18 19 19   Temp:  97.8 F (36.6 C) 97.6 F (36.4 C) 97.7 F (36.5 C)  TempSrc:  Oral Oral Oral  SpO2:  96% 96% 94%  Weight:   81.7 kg   Height:        Wt Readings from Last 3 Encounters:  06/01/19 81.7 kg  05/18/19 79.4 kg  05/16/19 80.7 kg     Intake/Output Summary (Last 24 hours) at 06/01/2019 1451 Last data filed at 06/01/2019 1042 Gross per 24  hour  Intake 120 ml  Output 600 ml  Net -480 ml    Physical Exam:   GENERAL: Pleasant-appearing in no apparent distress.  HEAD, EYES, EARS, NOSE AND THROAT: Atraumatic, normocephalic. Extraocular muscles are intact. Pupils equal and reactive to light. Sclerae anicteric. No conjunctival injection. No oro-pharyngeal erythema.  NECK: Supple. There is no jugular venous distention. No bruits, no lymphadenopathy, no thyromegaly.  HEART: Regular rate and rhythm,. No murmurs, no rubs, no clicks.  LUNGS: Clear to auscultation bilaterally. No rales or rhonchi. No wheezes.  ABDOMEN: Soft, flat, nontender, nondistended. Has good  bowel sounds. No hepatosplenomegaly appreciated.  EXTREMITIES: No evidence of any cyanosis, clubbing, or positive peripheral edema.  +2 pedal and radial pulses bilaterally.  NEUROLOGIC: The patient is alert, awake, and oriented x3 with no focal motor or sensory deficits appreciated bilaterally.  SKIN: Moist and warm with no rashes appreciated.  Psych: Not anxious, depressed LN: No inguinal LN enlargement    Antibiotics   Anti-infectives (From admission, onward)   None      Medications   Scheduled Meds: . allopurinol  100 mg Oral BID  . aspirin EC  81 mg Oral Daily  . Chlorhexidine Gluconate Cloth  6 each Topical Q0600  . [START ON 06/02/2019] colchicine  0.3 mg Oral Once per day on Mon Wed Fri  . heparin  5,000 Units Subcutaneous Q8H  . loratadine  10 mg Oral Daily  . pravastatin  20 mg Oral QHS  . sodium chloride flush  3 mL Intravenous Q12H  . tuberculin  5 Units Intradermal Once   Continuous Infusions: . sodium chloride     PRN Meds:.sodium chloride, acetaminophen, guaiFENesin-dextromethorphan, ondansetron (ZOFRAN) IV, sodium chloride flush   Data Review:   Micro Results Recent Results (from the past 240 hour(s))  SARS Coronavirus 2 (CEPHEID - Performed in [place] hospital lab), Hosp Order     Status: None   Collection Time: 05/30/19  1:15 PM    Specimen: Nasopharyngeal Swab  Result Value Ref Range Status   SARS Coronavirus 2 NEGATIVE NEGATIVE Final    Comment: (NOTE) If result is NEGATIVE SARS-CoV-2 target nucleic acids are NOT DETECTED. The SARS-CoV-2 RNA is generally detectable in upper and lower  respiratory specimens during the acute phase of infection. The lowest  concentration of SARS-CoV-2 viral copies this assay can detect is 250  copies / mL. A negative result does not preclude SARS-CoV-2 infection  and should not be used as the sole basis for treatment or other  patient management decisions.  A negative result may occur with  improper specimen collection / handling, submission of specimen other  than nasopharyngeal swab, presence of viral mutation(s) within the  areas targeted by this assay, and inadequate number of viral copies  (<250 copies / mL). A negative result must be combined with clinical  observations, patient history, and epidemiological information. If result is POSITIVE SARS-CoV-2 target nucleic acids are DETECTED. The SARS-CoV-2 RNA is generally detectable in upper and lower  respiratory specimens dur ing the acute phase of infection.  Positive  results are indicative of active infection with SARS-CoV-2.  Clinical  correlation with patient history and other diagnostic information is  necessary to determine patient infection status.  Positive results do  not rule out bacterial infection or co-infection with other viruses. If result is PRESUMPTIVE POSTIVE SARS-CoV-2 nucleic acids MAY BE PRESENT.   A presumptive positive result was obtained on the submitted specimen  and confirmed on repeat testing.  While 2019 novel coronavirus  (SARS-CoV-2) nucleic acids may be present in the submitted sample  additional confirmatory testing may be necessary for epidemiological  and / or clinical management purposes  to differentiate between  SARS-CoV-2 and other Sarbecovirus currently known to infect humans.  If  clinically indicated additional testing with an alternate test  methodology [phone]) is advised. The SARS-CoV-2 RNA is generally  detectable in upper and lower respiratory sp ecimens during the acute  phase of infection. The expected result is Negative. Fact Sheet for Patients:  StrictlyIdeas.no Fact Sheet for Healthcare Providers: BankingDealers.co.za This test is  not yet approved or cleared by the [place] and has been authorized for detection and/or diagnosis of SARS-CoV-2 by FDA under an Emergency Use Authorization (EUA).  This EUA will remain in effect (meaning this test can be used) for the duration of the COVID-19 declaration under Section 564(b)(1) of the Act, 21 U.S.C. section 360bbb-3(b)(1), unless the authorization is terminated or revoked sooner. Performed at [facility], [address], [place], [place] [address]     Radiology Reports Dg Chest [place] 1 View  Result Date: 05/09/2019 CLINICAL DATA:  Follow-up infiltrates EXAM: PORTABLE CHEST 1 VIEW COMPARISON:  05/08/2019 FINDINGS: Cardiac shadow is stable. Aortic calcifications are again seen. Bibasilar opacities are again seen and stable. Some improved vascular congestion is noted. No sizable effusion is noted. No bony abnormality is seen. IMPRESSION: Persistent bibasilar infiltrates. Electronically Signed   By: Inez Catalina M.D.   On: 05/09/2019 07:49   Dg Chest Portable 1 View  Result Date: 05/08/2019 CLINICAL DATA:  85 year old female with increased shortness of breath. Test for COVID-19 pending. EXAM: PORTABLE CHEST 1 VIEW COMPARISON:  01/26/2019 and earlier. FINDINGS: Portable AP upright view at 1946 hours. Acute on chronic bilateral pulmonary interstitial opacity. Lung volumes are lower. Stable cardiac size and mediastinal contours. Visualized tracheal air column is within normal limits. No pneumothorax, pleural effusion or consolidation. Paucity of bowel  gas in the upper abdomen. No acute osseous abnormality identified. IMPRESSION: Lower lung volumes with acute on chronic pulmonary interstitial opacity. Consider viral/atypical respiratory infection and pulmonary interstitial edema. Electronically Signed   By: Genevie Ann M.D.   On: 05/08/2019 20:07     CBC Recent Labs  Lab 05/30/19 1252 05/31/19 0500  WBC 5.1 4.3  HGB 8.7* 8.2*  HCT 26.7* 25.6*  PLT 156 150  MCV 92.7 93.1  MCH 30.2 29.8  MCHC 32.6 32.0  RDW 14.5 14.6    Chemistries  Recent Labs  Lab 05/30/19 1252 05/31/19 0500 06/01/19 0456  NA 125* 125* 130*  K 5.1 5.1 4.4  CL 91* 93* 96*  CO2 17* 19* 20*  GLUCOSE 124* 109* 96  BUN 113* 115* 95*  CREATININE 6.16*  6.20* 6.58* 5.22*  CALCIUM 9.0 8.5* 8.7*  AST 32  --   --   ALT 18  --   --   ALKPHOS 59  --   --   BILITOT 0.9  --   --    ------------------------------------------------------------------------------------------------------------------ estimated creatinine clearance is 7.7 mL/min (A) (by C-G formula based on SCr of 5.22 mg/dL (H)). ------------------------------------------------------------------------------------------------------------------ No results for input(s): HGBA1C in the last 72 hours. ------------------------------------------------------------------------------------------------------------------ No results for input(s): CHOL, HDL, LDLCALC, TRIG, CHOLHDL, LDLDIRECT in the last 72 hours. ------------------------------------------------------------------------------------------------------------------ No results for input(s): TSH, T4TOTAL, T3FREE, THYROIDAB in the last 72 hours.  Invalid input(s): FREET3 ------------------------------------------------------------------------------------------------------------------ No results for input(s): VITAMINB12, FOLATE, FERRITIN, TIBC, IRON, RETICCTPCT in the last 72 hours.  Coagulation profile No results for input(s): INR, PROTIME in the last 168  hours.  No results for input(s): DDIMER in the last 72 hours.  Cardiac Enzymes No results for input(s): CKMB, TROPONINI, MYOGLOBIN in the last 168 hours.  Invalid input(s): CK ------------------------------------------------------------------------------------------------------------------ Invalid input(s): [place]   Patient is a 85 year old white female with diastolic CHF as well as chronic kidney disease as well as moderate to severe aortic stenosis presenting with shortness of breath  1.  Acute on chronic diastolic CHF in setting of moderate to severe aortic stenosis Seen by  cardiology supportive care  2.  Acute kidney injury on chronic kidney disease not much urine output patient to be started on hemodialysis Possible permacath according to nephrology  3.  Essential hypertension continue to monitor for now   4.  Hyperlipidemia continue Pravachol  5.  Moderate to severe aortic stenosis further management per cardiology   6.  Miscellaneous heparin for DVT prophylaxis      Code Status Orders  (From admission, onward)         Start     Ordered   05/30/19 1159  Limited resuscitation (code)  Continuous    Question Answer Comment  In the event of cardiac or respiratory ARREST: Initiate Code Blue, Call Rapid Response Yes   In the event of cardiac or respiratory ARREST: Perform CPR Yes   In the event of cardiac or respiratory ARREST: Perform Intubation/Mechanical Ventilation No   In the event of cardiac or respiratory ARREST: Use NIPPV/BiPAp only if indicated Yes   In the event of cardiac or respiratory ARREST: Administer ACLS medications if indicated Yes   In the event of cardiac or respiratory ARREST: Perform Defibrillation or Cardioversion if indicated Yes      05/30/19 1158        Code Status History    Date Active Date Inactive Code Status Order ID Comments User Context   05/09/2019 0002 05/10/2019 1441 Full Code [id number]  Mayer Camel, NP Inpatient   10/16/2017 1237 10/20/2017 1859 Full Code [id number]  Gladstone Lighter, MD Inpatient   Advance Care Planning Activity           Consults cardiology  DVT Prophylaxis Heparin Lab Results  Component Value Date   PLT 150 05/31/2019     Time Spent in minutes 35 minutes greater than 50% of time spent in care coordination and counseling patient regarding the condition and plan of care.   Dustin Flock M.D on 06/01/2019 at 2:51 PM  Between 7am to 6pm - Pager - [phone]  After 6pm go to [email or web address] - Proofreader  [employer]   Office  [phone]

## 2019-06-01 NOTE — Progress Notes (Signed)
Notify Dr. Margaretmary Eddy about patient's SOB after coming back from dialysis which they did not complete because she did not tolerate the treatment, asked if patient can have PRN breathing treatment, order given. RN will continue to monitor.

## 2019-06-01 NOTE — Care Management Important Message (Signed)
Important Message  Patient Details  Name: Lori Johnson MRN: [id number] Date of Birth: December 10, 1929   Medicare Important Message Given:  Yes  Initial Medicare IM given by Patient Access Associate on 06/01/2019 at 12:06pm. Still valid.   Dannette Barbara 06/01/2019, 2:42 PM

## 2019-06-01 NOTE — Progress Notes (Signed)
Referral sent to [facility] for outpatient hemodialysis placement. Waiting on Hep B to result before clinic can accept. Insurance verified.   Elvera Bicker Dialysis Coordinator [phone]

## 2019-06-01 NOTE — Progress Notes (Signed)
Notify Dr. Jackelyn Poling if we can discontinue right femoral trialysis catheter since patient had a permcath place today, order ok to discontinue. RN will continue to monitor.

## 2019-06-01 NOTE — Progress Notes (Signed)
[facility]  ROUNDING NOTE   Subjective:  Pt seen at bedside.  Due for second HD treatment today.  Also having permcath placed today.   Objective:  Vital signs in last 24 hours:  Temp:  [97.6 F (36.4 C)-97.8 F (36.6 C)] 97.7 F (36.5 C) (07/02 1523) Pulse Rate:  [66-76] 76 (07/02 1523) Resp:  [18-22] 22 (07/02 1523) BP: (116-127)/(49-61) 127/61 (07/02 1523) SpO2:  [94 %-96 %] 95 % (07/02 1523) Weight:  [81.7 kg] 81.7 kg (07/02 0341)  Weight change: 1.225 kg Filed Weights   05/31/19 0541 05/31/19 1142 06/01/19 0341  Weight: 82.6 kg 82.6 kg 81.7 kg    Intake/Output: I/O last 3 completed shifts: In: 153.2 [P.O.:120; I.V.:33.2] Out: 100 [Urine:100]   Intake/Output this shift:  Total I/O In: -  Out: 600 [Urine:600]  Physical Exam: General: No acute distress  Head: Normocephalic, atraumatic. Moist oral mucosal membranes  Eyes: Anicteric  Neck: Supple, trachea midline  Lungs:  Clear to auscultation, normal effort  Heart: S1S2 no rubs 2/6 SEM  Abdomen:  Soft, nontender, bowel sounds present  Extremities: 2+ peripheral edema.  Neurologic: Awake, alert, following commands  Skin: No lesions  Access: Right temporary femoral dialysis catheter    Basic Metabolic Panel: Recent Labs  Lab 05/30/19 1252 05/31/19 0500 06/01/19 0456  NA 125* 125* 130*  K 5.1 5.1 4.4  CL 91* 93* 96*  CO2 17* 19* 20*  GLUCOSE 124* 109* 96  BUN 113* 115* 95*  CREATININE 6.16*  6.20* 6.58* 5.22*  CALCIUM 9.0 8.5* 8.7*    Liver Function Tests: Recent Labs  Lab 05/30/19 1252  AST 32  ALT 18  ALKPHOS 59  BILITOT 0.9  PROT 6.5  ALBUMIN 3.9   No results for input(s): LIPASE, AMYLASE in the last 168 hours. No results for input(s): AMMONIA in the last 168 hours.  CBC: Recent Labs  Lab 05/30/19 1252 05/31/19 0500  WBC 5.1 4.3  HGB 8.7* 8.2*  HCT 26.7* 25.6*  MCV 92.7 93.1  PLT 156 150    Cardiac Enzymes: No results for input(s): CKTOTAL, CKMB, CKMBINDEX,  TROPONINI in the last 168 hours.  BNP: Invalid input(s): POCBNP  CBG: No results for input(s): GLUCAP in the last 168 hours.  Microbiology: Results for orders placed or performed during the hospital encounter of 05/30/19  SARS Coronavirus 2 (CEPHEID - Performed in [place] hospital lab), Hosp Order     Status: None   Collection Time: 05/30/19  1:15 PM   Specimen: Nasopharyngeal Swab  Result Value Ref Range Status   SARS Coronavirus 2 NEGATIVE NEGATIVE Final    Comment: (NOTE) If result is NEGATIVE SARS-CoV-2 target nucleic acids are NOT DETECTED. The SARS-CoV-2 RNA is generally detectable in upper and lower  respiratory specimens during the acute phase of infection. The lowest  concentration of SARS-CoV-2 viral copies this assay can detect is 250  copies / mL. A negative result does not preclude SARS-CoV-2 infection  and should not be used as the sole basis for treatment or other  patient management decisions.  A negative result may occur with  improper specimen collection / handling, submission of specimen other  than nasopharyngeal swab, presence of viral mutation(s) within the  areas targeted by this assay, and inadequate number of viral copies  (<250 copies / mL). A negative result must be combined with clinical  observations, patient history, and epidemiological information. If result is POSITIVE SARS-CoV-2 target nucleic acids are DETECTED. The SARS-CoV-2 RNA is generally  detectable in upper and lower  respiratory specimens dur ing the acute phase of infection.  Positive  results are indicative of active infection with SARS-CoV-2.  Clinical  correlation with patient history and other diagnostic information is  necessary to determine patient infection status.  Positive results do  not rule out bacterial infection or co-infection with other viruses. If result is PRESUMPTIVE POSTIVE SARS-CoV-2 nucleic acids MAY BE PRESENT.   A presumptive positive result was obtained on  the submitted specimen  and confirmed on repeat testing.  While 2019 novel coronavirus  (SARS-CoV-2) nucleic acids may be present in the submitted sample  additional confirmatory testing may be necessary for epidemiological  and / or clinical management purposes  to differentiate between  SARS-CoV-2 and other Sarbecovirus currently known to infect humans.  If clinically indicated additional testing with an alternate test  methodology [phone]) is advised. The SARS-CoV-2 RNA is generally  detectable in upper and lower respiratory sp ecimens during the acute  phase of infection. The expected result is Negative. Fact Sheet for Patients:  StrictlyIdeas.no Fact Sheet for Healthcare Providers: BankingDealers.co.za This test is not yet approved or cleared by the [place] FDA and has been authorized for detection and/or diagnosis of SARS-CoV-2 by FDA under an Emergency Use Authorization (EUA).  This EUA will remain in effect (meaning this test can be used) for the duration of the COVID-19 declaration under Section 564(b)(1) of the Act, 21 U.S.C. section 360bbb-3(b)(1), unless the authorization is terminated or revoked sooner. Performed at [facility], [address] [place], [place] [address]     Coagulation Studies: No results for input(s): LABPROT, INR in the last 72 hours.  Urinalysis: No results for input(s): COLORURINE, LABSPEC, PHURINE, GLUCOSEU, HGBUR, BILIRUBINUR, KETONESUR, PROTEINUR, UROBILINOGEN, NITRITE, LEUKOCYTESUR in the last 72 hours.  Invalid input(s): APPERANCEUR    Imaging: No results found.   Medications:   . clindamycin    . [MAR Hold] sodium chloride    . sodium chloride    . clindamycin (CLEOCIN) IV     . [MAR Hold] allopurinol  100 mg Oral BID  . [MAR Hold] aspirin EC  81 mg Oral Daily  . [MAR Hold] Chlorhexidine Gluconate Cloth  6 each Topical Q0600  . [MAR Hold] colchicine  0.3 mg Oral  Once per day on Mon Wed Fri  . [MAR Hold] heparin  5,000 Units Subcutaneous Q8H  . [MAR Hold] loratadine  10 mg Oral Daily  . [MAR Hold] pravastatin  20 mg Oral QHS  . [MAR Hold] sodium chloride flush  3 mL Intravenous Q12H  . [MAR Hold] tuberculin  5 Units Intradermal Once   [MAR Hold] sodium chloride, [MAR Hold] acetaminophen, diphenhydrAMINE, famotidine, [MAR Hold] guaiFENesin-dextromethorphan, HYDROmorphone (DILAUDID) injection, methylPREDNISolone (SOLU-MEDROL) injection, midazolam, ondansetron (ZOFRAN) IV, [MAR Hold] sodium chloride flush  Assessment/ Plan:  85 y.o. female  with gout, severe peripheral vascular disease status post femoral bypass 15 years ago, several angioplasties, right leg DVT, hearing loss, hypertension, left eye macular degeneration, coronary disease with stent and angioplasty.  1.  Acute renal failure/chronic kidney disease stage IV baseline creatinine 2.4/proteinuria. UOP increasing a bit.  BUN/Cr still critically high.  Second HD treatment today, will also have permcath placed today.    2.  Anemia of chronic kidney disease.  Consider epogen during this hospitalization.   3.  Secondary hyperparathyroidism. Check serum phos with HD today.    LOS: 2 Lori Johnson 7/2/20203:32 PM

## 2019-06-01 NOTE — Progress Notes (Signed)
Post HD Tx  HD tx ends early d/t the following:  Mild chest pain 4/10 new onset with BP decline to 98/37. UF off, BP improved (to 105/58). UF remained off. BP then declined to 86/62. 100 mL fluid bolus given at that point, BP improved yet again - 113/54. Then pt into Afib on monitor (was NSR until this point), HR ranging from 70 to 94, O2 saturation to 89% after probe change - O2 increased to 5L [place], sats up to 94%. Tx terminated early and Dr. Holley Raring paged during event. Given full update.   O2 sats to 99% on 3L post tx.  Afib also resolved after blood was returned.  Chest pain resolved to 2/10.  Net gain of 100 mL fluid (unable to reach goal of 575mL fluid removal d/t symptomatic hypotension episodes). Expiratory wheezes prominent, cough, and evidence of fluid overload (edema, increased WOB) remains prominent post tx.   Dr. Holley Raring and floor RN notified.     06/01/19 1830  Hand-Off documentation  Report given to (Full Name) Aniceto Boss RN 2A  Report received from (Full Name) Trellis Paganini RN  Vital Signs  Temp 97.8 F (36.6 C)  Temp Source Oral  Pulse Rate 91  Pulse Rate Source Monitor  Resp (!) 24  BP (!) 113/54  BP Location Left Arm  BP Method Automatic  Patient Position (if appropriate) Sitting  Oxygen Therapy  SpO2 97 %  O2 Device Nasal Cannula  O2 Flow Rate (L/min) 3 L/min  Pain Assessment  Pain Scale 0-10  Pain Score 2  Pain Type Acute pain  Pain Location Chest  Pain Orientation Mid  Pain Descriptors / Indicators Tightness  Pain Onset Sudden  Pain Intervention(s) MD notified (Comment);RN made aware (reassessment-Pt says"pain going away now"referring to posttx)  Dialysis Weight  Weight 81.8 kg  Type of Weight Post-Dialysis  During Hemodialysis Assessment  Blood Flow Rate (mL/min) 200 mL/min  Arterial Pressure (mmHg) -100 mmHg  Venous Pressure (mmHg) 100 mmHg  Transmembrane Pressure (mmHg) 60 mmHg  Ultrafiltration Rate (mL/min) 0 mL/min  Dialysate Flow Rate (mL/min)  500 ml/min  Conductivity: Machine  13.9  HD Safety Checks Performed Yes  Dialysis Fluid Bolus Normal Saline  Bolus Amount (mL) 250 mL  Intra-Hemodialysis Comments Tx completed;See progress note  Post-Hemodialysis Assessment  Rinseback Volume (mL) 250 mL  Dialyzer Clearance Lightly streaked  Duration of HD Treatment -hour(s) 1.75 hour(s)  Hemodialysis Intake (mL) 600 mL  UF Total -Machine (mL) 500 mL  Net UF (mL) -100 mL  Hemodialysis Catheter Right Subclavian  Placement Date/Time: 06/01/19 1605   Time Out: Correct patient;Correct site;Correct procedure  Maximum sterile barrier precautions: Hand hygiene;Sterile gloves;Cap;Large sterile sheet;Mask;Sterile gown  Site Prep: Chlorhexidine (preferred)  Local Anes...  Site Condition No complications  Blue Lumen Status Flushed;Capped (Central line);Heparin locked  Red Lumen Status Flushed;Capped (Central line);Heparin locked  Purple Lumen Status N/A  Catheter fill solution Heparin 1000 units/ml  Dressing Type Gauze/Drain sponge;Occlusive  Dressing Status Clean;Dry;Intact  Drainage Description None  Dressing Change Due 06/02/19  Post treatment catheter status Capped and Clamped

## 2019-06-01 NOTE — H&P (Signed)
[facility] History & Physical Update  The patient was interviewed and re-examined.  The patient's previous History and Physical has been reviewed and is unchanged.  There is no change in the plan of care. We plan to proceed with the scheduled procedure.  Leotis Pain, MD  06/01/2019, 3:26 PM

## 2019-06-01 NOTE — Progress Notes (Signed)
HD Tx Start  Arrived post perm cath placement, increased WOB, yet oxygen at 99% on 3L. Second HD tx, 2.5 hours, attempting to remove 553mL per MD order.   06/01/19 1650  Hand-Off documentation  Report given to (Full Name) Trellis Paganini RN  Report received from (Full Name) Forestine Chute RN  Vital Signs  Temp 98 F (36.7 C)  Temp Source Oral  Pulse Rate 89  Pulse Rate Source Monitor  Resp 18  BP (!) 120/51  BP Location Left Arm  BP Method Automatic  Patient Position (if appropriate) Lying  Oxygen Therapy  SpO2 98 %  O2 Device Nasal Cannula  O2 Flow Rate (L/min) 3 L/min  Pain Assessment  Pain Scale 0-10  Pain Score 0  Dialysis Weight  Weight 81.7 kg  Type of Weight Pre-Dialysis  Time-Out for Hemodialysis  What Procedure? hemodialysis  Pt Identifiers(min of two) First/Last Name;MRN/Account#  Correct Site? Yes  Correct Side? Yes  Correct Procedure? Yes  Consents Verified? Yes  Rad Studies Available? N/A  Safety Precautions Reviewed? Yes  Engineer, civil (consulting) Number 3  Station Number 1  UF/Alarm Test Passed  Conductivity: Meter 13.8  Conductivity: Machine  14  pH 7.2  Reverse Osmosis Main  Normal Saline Lot Number [id number]  Dialyzer Lot Number [id number]  Disposable Set Lot Number [id number]  Machine Temperature 98.6 F (37 C)  Musician and Audible Yes  Blood Lines Intact and Secured Yes  Pre Treatment Patient Checks  Vascular access used during treatment Catheter  HD catheter dressing before treatment WDL  Hepatitis B Surface Antigen Results Pending  Date Hepatitis B Surface Antigen Drawn 05/31/19  Date Hepatitis B Surface Antibody Drawn 05/31/19  Hemodialysis Consent Verified Yes  Hemodialysis Standing Orders Initiated Yes  ECG (Telemetry) Monitor On Yes  Prime Ordered Normal Saline  Length of  DialysisTreatment -hour(s) 2.5 Hour(s)  Dialysis Treatment Comments Na 140  Dialyzer Elisio 17H NR  Dialysate 2.5 Ca;3K  Dialysate Flow Ordered 500  Blood Flow  Rate Ordered 250 mL/min  Ultrafiltration Goal 0.5 Liters  Dialysis Blood Pressure Support Ordered Normal Saline  During Hemodialysis Assessment  Blood Flow Rate (mL/min) 250 mL/min  Arterial Pressure (mmHg) -100 mmHg  Venous Pressure (mmHg) 110 mmHg  Transmembrane Pressure (mmHg) 60 mmHg  Ultrafiltration Rate (mL/min) 400 mL/min (400 mL per HOUR)  Dialysate Flow Rate (mL/min) 500 ml/min  Conductivity: Machine  13.9  HD Safety Checks Performed Yes  Dialysis Fluid Bolus Normal Saline  Bolus Amount (mL) 250 mL  Intra-Hemodialysis Comments Tx initiated  Education / Care Plan  Dialysis Education Provided Yes  Documented Education in Care Plan Yes  Hemodialysis Catheter Right Subclavian  Placement Date/Time: 06/01/19 1605   Time Out: Correct patient;Correct site;Correct procedure  Maximum sterile barrier precautions: Hand hygiene;Sterile gloves;Cap;Large sterile sheet;Mask;Sterile gown  Site Prep: Chlorhexidine (preferred)  Local Anes...  Site Condition No complications  Blue Lumen Status Infusing  Red Lumen Status Infusing  Purple Lumen Status N/A  Dressing Type Gauze/Drain sponge;Occlusive  Dressing Status Clean;Dry;Intact  Drainage Description None  Dressing Change Due 06/02/19

## 2019-06-01 NOTE — Plan of Care (Signed)
Vascular surgeon plans to insert tunneled permanent hemodialysis catheter today.  Pt to receive HD after procedure.   Problem: Education: Goal: Knowledge of disease and its progression will improve Outcome: Progressing   Problem: Health Behavior/Discharge Planning: Goal: Ability to manage health-related needs will improve Outcome: Progressing   Problem: Clinical Measurements: Goal: Complications related to the disease process or treatment will be avoided or minimized Outcome: Progressing Goal: Dialysis access will remain free of complications Outcome: Progressing   Problem: Activity: Goal: Activity intolerance will improve Outcome: Progressing   Problem: Fluid Volume: Goal: Fluid volume balance will be maintained or improved Outcome: Progressing   Problem: Nutritional: Goal: Ability to make appropriate dietary choices will improve Outcome: Progressing   Problem: Respiratory: Goal: Respiratory symptoms related to disease process will be avoided Outcome: Progressing   Problem: Self-Concept: Goal: Body image disturbance will be avoided or minimized Outcome: Progressing   Problem: Urinary Elimination: Goal: Progression of disease will be identified and treated Outcome: Progressing   Problem: Education: Goal: Ability to demonstrate management of disease process will improve Outcome: Progressing Goal: Ability to verbalize understanding of medication therapies will improve Outcome: Progressing Goal: Individualized Educational Video(s) Outcome: Progressing   Problem: Activity: Goal: Capacity to carry out activities will improve Outcome: Progressing   Problem: Cardiac: Goal: Ability to achieve and maintain adequate cardiopulmonary perfusion will improve Outcome: Progressing   Problem: Education: Goal: Knowledge of General Education information will improve Description: Including pain rating scale, medication(s)/side effects and non-pharmacologic comfort  measures Outcome: Progressing   Problem: Health Behavior/Discharge Planning: Goal: Ability to manage health-related needs will improve Outcome: Progressing   Problem: Clinical Measurements: Goal: Ability to maintain clinical measurements within normal limits will improve Outcome: Progressing Goal: Will remain free from infection Outcome: Progressing Goal: Diagnostic test results will improve Outcome: Progressing Goal: Respiratory complications will improve Outcome: Progressing Goal: Cardiovascular complication will be avoided Outcome: Progressing   Problem: Activity: Goal: Risk for activity intolerance will decrease Outcome: Progressing   Problem: Nutrition: Goal: Adequate nutrition will be maintained Outcome: Progressing   Problem: Coping: Goal: Level of anxiety will decrease Outcome: Progressing   Problem: Elimination: Goal: Will not experience complications related to bowel motility Outcome: Progressing Goal: Will not experience complications related to urinary retention Outcome: Progressing   Problem: Pain Managment: Goal: General experience of comfort will improve Outcome: Progressing   Problem: Safety: Goal: Ability to remain free from injury will improve Outcome: Progressing   Problem: Skin Integrity: Goal: Risk for impaired skin integrity will decrease Outcome: Progressing

## 2019-06-01 NOTE — Progress Notes (Addendum)
Post HD Assessment  See HD tx end note (from 18:30) for details on early HD tx termination.   06/01/19 1900  Neurological  Level of Consciousness Alert  Orientation Level Oriented X4  Respiratory  Respiratory Pattern Labored;Dyspnea at rest  Bilateral Breath Sounds Expiratory wheezes  Cardiac  Cardiac Rhythm NSR  Vascular  R Radial Pulse +2  L Radial Pulse +2  Edema Right lower extremity;Left lower extremity;Generalized  Generalized Edema +1  RUE Edema +1  LUE Edema +1  RLE Edema +2  LLE Edema +2  Integumentary  Integumentary (WDL) X  Skin Color Appropriate for ethnicity  Skin Condition Dry  Skin Integrity Blister  Weeping Location Leg  Weeping Location Orientation Bilateral  Musculoskeletal  Musculoskeletal (WDL) X  Generalized Weakness Yes  Gastrointestinal  Bowel Sounds Assessment Active  GU Assessment  Genitourinary (WDL) X  Genitourinary Symptoms Oliguria;Other (Comment);External catheter (HD pt 2nd tx)  Urine Characteristics  Urine Color Yellow/straw  Urine Appearance Clear  Psychosocial  Psychosocial (WDL) WDL

## 2019-06-02 LAB — BASIC METABOLIC PANEL
Anion gap: 13 (ref 5–15)
BUN: 69 mg/dL — ABNORMAL HIGH (ref 8–23)
CO2: 22 mmol/L (ref 22–32)
Calcium: 8.7 mg/dL — ABNORMAL LOW (ref 8.9–10.3)
Chloride: 97 mmol/L — ABNORMAL LOW (ref 98–111)
Creatinine, Ser: 3.77 mg/dL — ABNORMAL HIGH (ref 0.44–1.00)
GFR calc Af Amer: 12 mL/min — ABNORMAL LOW (ref >60)
GFR calc non Af Amer: 10 mL/min — ABNORMAL LOW (ref >60)
Glucose, Bld: 95 mg/dL (ref 70–99)
Potassium: 4.2 mmol/L (ref 3.5–5.1)
Sodium: 132 mmol/L — ABNORMAL LOW (ref 135–145)

## 2019-06-02 LAB — HEPATITIS B SURFACE ANTIGEN: Hepatitis B Surface Ag: NEGATIVE

## 2019-06-02 LAB — HEPATITIS B CORE ANTIBODY, IGM: Hep B C IgM: NEGATIVE

## 2019-06-02 LAB — PHOSPHORUS: Phosphorus: 5.4 mg/dL — ABNORMAL HIGH (ref 2.5–4.6)

## 2019-06-02 LAB — HEPATITIS B SURFACE ANTIBODY, QUANTITATIVE: Hep B S AB Quant (Post): 7.3 m[IU]/mL — ABNORMAL LOW (ref >9.9)

## 2019-06-02 NOTE — Progress Notes (Signed)
Breath sounds unlabored and intermittently shallow. CVC to right chest wall intact. Day 2 of HD, UF goal 500 mL.    06/02/19 1150  Vital Signs  Temp 97.6 F (36.4 C)  Temp Source Oral  Pulse Rate 84  Pulse Rate Source Monitor  Resp 20  BP (!) 120/56  BP Location Left Arm  BP Method Automatic  Patient Position (if appropriate) Lying  Oxygen Therapy  SpO2 97 %  O2 Device Nasal Cannula  O2 Flow Rate (L/min) 3 L/min  Pulse Oximetry Type Continuous  Pain Assessment  Pain Scale 0-10  Pain Score 0  Dialysis Weight  Weight 81.8 kg  Type of Weight Pre-Dialysis  Time-Out for Hemodialysis  What Procedure? HD  Pt Identifiers(min of two) First/Last Name;Pt's DOB(use if MRN/Acct# not available  Correct Site? Yes  Correct Side? Yes  Correct Procedure? Yes  Consents Verified? Yes  Rad Studies Available? N/A  Safety Precautions Reviewed? Yes  Engineer, civil (consulting) Number 5  Station Number 3  UF/Alarm Test Passed  Conductivity: Meter 13.9  Conductivity: Machine  14  Reverse Osmosis Main  Normal Saline Lot Number [id number]  Dialyzer Lot Number [id number]  Machine Temperature 98.8 F (37.1 C)  Musician and Audible Yes  Blood Lines Intact and Secured Yes  Pre Treatment Patient Checks  Vascular access used during treatment Catheter  HD catheter dressing before treatment Not dated  Hepatitis B Surface Antigen Results Pending  Date Hepatitis B Surface Antigen Drawn 05/31/19  Hepatitis B Surface Antibody  (<7.3)  Date Hepatitis B Surface Antibody Drawn 05/31/19  Hemodialysis Consent Verified Yes  Hemodialysis Standing Orders Initiated Yes  ECG (Telemetry) Monitor On Yes  Prime Ordered Normal Saline  Length of  DialysisTreatment -hour(s) 3 Hour(s)  Dialyzer Optiflux 180 NR  Dialysate 3K;2.5 Ca  Dialysate Flow Ordered 500  Blood Flow Rate Ordered 250 mL/min  Ultrafiltration Goal 500 Liters  Pre Treatment Labs Renal panel;Phosphorus  Dialysis Blood Pressure Support Ordered  Normal Saline

## 2019-06-02 NOTE — Progress Notes (Signed)
HD Tx complet, 500 mL removed, Pt tolerance of Tx improved.    06/02/19 1530  Vital Signs  Temp 98.3 F (36.8 C)  Temp Source Oral  Pulse Rate 99  Pulse Rate Source Monitor  Resp (!) 22  BP 106/71  BP Location Left Arm  BP Method Automatic  Patient Position (if appropriate) Lying  Oxygen Therapy  SpO2 95 %  O2 Device Nasal Cannula  O2 Flow Rate (L/min) 3 L/min  Pulse Oximetry Type Continuous  Pain Assessment  Pain Scale 0-10  Pain Score 0  During Hemodialysis Assessment  Blood Flow Rate (mL/min) 250 mL/min  Arterial Pressure (mmHg) -60 mmHg  Venous Pressure (mmHg) 80 mmHg  Transmembrane Pressure (mmHg) 60 mmHg  Ultrafiltration Rate (mL/min) 330 mL/min  Dialysate Flow Rate (mL/min) 500 ml/min  Conductivity: Machine  13.9  HD Safety Checks Performed Yes  Intra-Hemodialysis Comments Tolerated well

## 2019-06-02 NOTE — Progress Notes (Signed)
[facility]  ROUNDING NOTE   Subjective:  Patient became a bit short of breath during dialysis treatment yesterday. Has known A. fib but did not become significantly tachycardic. She is due for another dialysis session today.  Objective:  Vital signs in last 24 hours:  Temp:  [97.6 F (36.4 C)-98 F (36.7 C)] 97.6 F (36.4 C) (07/03 1230) Pulse Rate:  [73-95] 91 (07/03 1315) Resp:  [14-27] 22 (07/03 1315) BP: (86-136)/(37-92) 108/55 (07/03 1315) SpO2:  [89 %-100 %] 97 % (07/03 1315) Weight:  [81.7 kg-81.8 kg] 81.8 kg (07/03 1150)  Weight change: -0.9 kg Filed Weights   06/01/19 1830 06/02/19 0503 06/02/19 1150  Weight: 81.8 kg 81.8 kg 81.8 kg    Intake/Output: I/O last 3 completed shifts: In: 120 [P.O.:120] Out: 1100 [Urine:1200]   Intake/Output this shift:  Total I/O In: 360 [P.O.:360] Out: -   Physical Exam: General: No acute distress  Head: Normocephalic, atraumatic. Moist oral mucosal membranes  Eyes: Anicteric  Neck: Supple, trachea midline  Lungs:  Clear to auscultation, normal effort  Heart: S1S2 no rubs 2/6 SEM  Abdomen:  Soft, nontender, bowel sounds present  Extremities: 2+ peripheral edema.  Neurologic: Awake, alert, following commands  Skin: No lesions  Access: Right internal jugular PermCath    Basic Metabolic Panel: Recent Labs  Lab 05/30/19 1252 05/31/19 0500 06/01/19 0456 06/01/19 1030 06/02/19 0604  NA 125* 125* 130*  --  132*  K 5.1 5.1 4.4  --  4.2  CL 91* 93* 96*  --  97*  CO2 17* 19* 20*  --  22  GLUCOSE 124* 109* 96  --  95  BUN 113* 115* 95*  --  69*  CREATININE 6.16*  6.20* 6.58* 5.22*  --  3.77*  CALCIUM 9.0 8.5* 8.7*  --  8.7*  PHOS  --   --   --  6.9*  --     Liver Function Tests: Recent Labs  Lab 05/30/19 1252  AST 32  ALT 18  ALKPHOS 59  BILITOT 0.9  PROT 6.5  ALBUMIN 3.9   No results for input(s): LIPASE, AMYLASE in the last 168 hours. No results for input(s): AMMONIA in the last 168  hours.  CBC: Recent Labs  Lab 05/30/19 1252 05/31/19 0500  WBC 5.1 4.3  HGB 8.7* 8.2*  HCT 26.7* 25.6*  MCV 92.7 93.1  PLT 156 150    Cardiac Enzymes: No results for input(s): CKTOTAL, CKMB, CKMBINDEX, TROPONINI in the last 168 hours.  BNP: Invalid input(s): POCBNP  CBG: No results for input(s): GLUCAP in the last 168 hours.  Microbiology: Results for orders placed or performed during the hospital encounter of 05/30/19  SARS Coronavirus 2 (CEPHEID - Performed in [place] hospital lab), Hosp Order     Status: None   Collection Time: 05/30/19  1:15 PM   Specimen: Nasopharyngeal Swab  Result Value Ref Range Status   SARS Coronavirus 2 NEGATIVE NEGATIVE Final    Comment: (NOTE) If result is NEGATIVE SARS-CoV-2 target nucleic acids are NOT DETECTED. The SARS-CoV-2 RNA is generally detectable in upper and lower  respiratory specimens during the acute phase of infection. The lowest  concentration of SARS-CoV-2 viral copies this assay can detect is 250  copies / mL. A negative result does not preclude SARS-CoV-2 infection  and should not be used as the sole basis for treatment or other  patient management decisions.  A negative result may occur with  improper specimen collection / handling,  submission of specimen other  than nasopharyngeal swab, presence of viral mutation(s) within the  areas targeted by this assay, and inadequate number of viral copies  (<250 copies / mL). A negative result must be combined with clinical  observations, patient history, and epidemiological information. If result is POSITIVE SARS-CoV-2 target nucleic acids are DETECTED. The SARS-CoV-2 RNA is generally detectable in upper and lower  respiratory specimens dur ing the acute phase of infection.  Positive  results are indicative of active infection with SARS-CoV-2.  Clinical  correlation with patient history and other diagnostic information is  necessary to determine patient infection status.   Positive results do  not rule out bacterial infection or co-infection with other viruses. If result is PRESUMPTIVE POSTIVE SARS-CoV-2 nucleic acids MAY BE PRESENT.   A presumptive positive result was obtained on the submitted specimen  and confirmed on repeat testing.  While 2019 novel coronavirus  (SARS-CoV-2) nucleic acids may be present in the submitted sample  additional confirmatory testing may be necessary for epidemiological  and / or clinical management purposes  to differentiate between  SARS-CoV-2 and other Sarbecovirus currently known to infect humans.  If clinically indicated additional testing with an alternate test  methodology [phone]) is advised. The SARS-CoV-2 RNA is generally  detectable in upper and lower respiratory sp ecimens during the acute  phase of infection. The expected result is Negative. Fact Sheet for Patients:  StrictlyIdeas.no Fact Sheet for Healthcare Providers: BankingDealers.co.za This test is not yet approved or cleared by the [place] FDA and has been authorized for detection and/or diagnosis of SARS-CoV-2 by FDA under an Emergency Use Authorization (EUA).  This EUA will remain in effect (meaning this test can be used) for the duration of the COVID-19 declaration under Section 564(b)(1) of the Act, 21 U.S.C. section 360bbb-3(b)(1), unless the authorization is terminated or revoked sooner. Performed at [facility], [address] [place], [place] [address]     Coagulation Studies: No results for input(s): LABPROT, INR in the last 72 hours.  Urinalysis: No results for input(s): COLORURINE, LABSPEC, PHURINE, GLUCOSEU, HGBUR, BILIRUBINUR, KETONESUR, PROTEINUR, UROBILINOGEN, NITRITE, LEUKOCYTESUR in the last 72 hours.  Invalid input(s): APPERANCEUR    Imaging: No results found.   Medications:   . sodium chloride     . allopurinol  100 mg Oral BID  . aspirin EC  81 mg Oral  Daily  . Chlorhexidine Gluconate Cloth  6 each Topical Q0600  . colchicine  0.3 mg Oral Once per day on Mon Wed Fri  . [START ON 06/03/2019] epoetin (EPOGEN/PROCRIT) injection  10,000 Units Intravenous Q T,Th,Sa-HD  . heparin  5,000 Units Subcutaneous Q8H  . loratadine  10 mg Oral Daily  . pravastatin  20 mg Oral QHS  . sodium chloride flush  3 mL Intravenous Q12H  . tuberculin  5 Units Intradermal Once   sodium chloride, acetaminophen, albuterol, guaiFENesin-dextromethorphan, HYDROmorphone (DILAUDID) injection, ondansetron (ZOFRAN) IV, sodium chloride flush  Assessment/ Plan:  85 y.o. female  with gout, severe peripheral vascular disease status post femoral bypass 15 years ago, several angioplasties, right leg DVT, hearing loss, hypertension, left eye macular degeneration, coronary disease with stent and angioplasty.  1.  Acute renal failure/chronic kidney disease stage IV baseline creatinine 2.4/proteinuria.  Urine output was found to be 1.2 L over the preceding 24 hours.  This may be the first sign of recovery.  BUN currently 69 with a creatinine 3.77.  We plan for another dialysis session today.  2.  Anemia of chronic kidney disease.  Hemoglobin currently 8.2.  Hold off on Epogen for now.  3.  Secondary hyperparathyroidism.  Serum phosphorus currently 6.9.  We will need to continue to monitor this closely.  LOS: 3 Tiwana Chavis 7/3/20202:15 PM

## 2019-06-02 NOTE — Progress Notes (Signed)
Non-productive cough noted mid-Tx. SpO2 maintained greater than 90% on 3L throughout Tx. Pt requires continual education on proper breathing to maintain O2 saturation. Pt educated to available PRN bronchodilator and instructed to ask for medication as needed.    06/02/19 1530  Neurological  Level of Consciousness Alert  Orientation Level Oriented X4  Respiratory  Respiratory Pattern Regular;Unlabored  Chest Assessment Chest expansion symmetrical  Bilateral Breath Sounds Diminished  R Upper  Breath Sounds Clear  L Upper Breath Sounds Clear  R Lower Breath Sounds Clear;Diminished  L Lower Breath Sounds Clear;Diminished  Cough Non-productive  Cardiac  Cardiac Rhythm Atrial fibrillation  Vascular  R Radial Pulse +2  L Radial Pulse +2  Edema Generalized;Right lower extremity;Left lower extremity  Psychosocial  Psychosocial (WDL) WDL

## 2019-06-02 NOTE — Progress Notes (Signed)
Pt Tx initiated. Vital stable, no reports of distress. Reeducated Pt to appropriate breathing technique.    06/02/19 1220  Vital Signs  Temp 97.6 F (36.4 C)  Temp Source Oral  Pulse Rate 84  Pulse Rate Source Monitor  Resp (!) 22  BP (!) 120/58  BP Location Left Arm  BP Method Automatic  Patient Position (if appropriate) Lying  Oxygen Therapy  SpO2 98 %  O2 Device Nasal Cannula  O2 Flow Rate (L/min) 3 L/min  Pulse Oximetry Type Continuous  Pain Assessment  Pain Scale 0-10  Pain Score 0  During Hemodialysis Assessment  Blood Flow Rate (mL/min) 250 mL/min  Arterial Pressure (mmHg) -80 mmHg  Venous Pressure (mmHg) 60 mmHg  Transmembrane Pressure (mmHg) 70 mmHg  Ultrafiltration Rate (mL/min) 330 mL/min  Dialysate Flow Rate (mL/min) 500 ml/min  Conductivity: Machine  14  HD Safety Checks Performed Yes  Intra-Hemodialysis Comments Tx initiated

## 2019-06-02 NOTE — Progress Notes (Signed)
Discontinue right femoral trialysis catheter, held pressure for 20 minutes for hemostasis. Dressed with petroleum gauze, gauze and silk tape. Instruct patient and daughter to report bleeding if noted.

## 2019-06-02 NOTE — Progress Notes (Signed)
Pt tolerated Tx well, SpO2 maintained greater than 93% throughout Tx, repositioned with pillows. CVC dressing changed.    06/02/19 1545  Vital Signs  Temp 98 F (36.7 C)  Temp Source Oral  Pulse Rate 91  Pulse Rate Source Monitor  Resp (!) 25  BP 128/66  BP Location Left Arm  BP Method Automatic  Patient Position (if appropriate) Lying  Oxygen Therapy  SpO2 96 %  O2 Device Nasal Cannula  O2 Flow Rate (L/min) 3 L/min  Pulse Oximetry Type Continuous  Pain Assessment  Pain Scale 0-10  Pain Score 0  Dialysis Weight  Weight 81.7 kg  Type of Weight Post-Dialysis  Post-Hemodialysis Assessment  Rinseback Volume (mL) 250 mL  Dialyzer Clearance Clear  Duration of HD Treatment -hour(s) 3 hour(s)  Hemodialysis Intake (mL) 500 mL  UF Total -Machine (mL) 1002 mL  Net UF (mL) 502 mL  Tolerated HD Treatment Yes  Education / Care Plan  Dialysis Education Provided Yes  Documented Education in Care Plan Yes  Hemodialysis Catheter Right Subclavian  Placement Date/Time: 06/01/19 1605   Time Out: Correct patient;Correct site;Correct procedure  Maximum sterile barrier precautions: Hand hygiene;Sterile gloves;Cap;Large sterile sheet;Mask;Sterile gown  Site Prep: Chlorhexidine (preferred)  Local Anes...  Site Condition No complications  Blue Lumen Status Flushed;Capped (Central line);Heparin locked;Blood return noted  Red Lumen Status Flushed;Capped (Central line);Heparin locked;Blood return noted  Dressing Type Biopatch  Dressing Status Dressing changed  Drainage Description None  Dressing Change Due 06/08/19  Post treatment catheter status Capped and Clamped

## 2019-06-02 NOTE — Progress Notes (Signed)
   06/02/19 1150  Neurological  Level of Consciousness Alert  Orientation Level Oriented X4  Respiratory  Respiratory Pattern Regular;Unlabored  Chest Assessment Chest expansion symmetrical  R Upper  Breath Sounds Clear  L Upper Breath Sounds Clear  R Lower Breath Sounds Clear;Diminished  L Lower Breath Sounds Clear;Diminished  Cough Non-productive  Cardiac  Cardiac Rhythm Atrial fibrillation  Vascular  R Radial Pulse +2  L Radial Pulse +2  Generalized Edema +1  Psychosocial  Psychosocial (WDL) WDL  Breath sounds unlabored and intermittently shallow, expiratory wheezes audible. SpO2 at 97% on 3L [place]. Pt denies pain. Pt reports some feelings of SOB but resolved once transferred to wall O2.

## 2019-06-02 NOTE — Progress Notes (Signed)
[facility] [facility]                                                                                                                                                                                  Patient Demographics   Lori Johnson, is a 85 y.o. female, DOB - Jul 04, 1930, [id number]  Admit date - 05/30/2019   Admitting Physician Gladstone Lighter, MD  Outpatient Primary MD for the patient is Chrismon, Vickki Muff, PA   LOS - 3  Subjective: Patient's feeling little better shortness of breath improved    Review of Systems:   CONSTITUTIONAL: No documented fever. No fatigue, weakness. No weight gain, no weight loss.  EYES: No blurry or double vision.  ENT: No tinnitus. No postnasal drip. No redness of the oropharynx.  RESPIRATORY: No cough, no wheeze, no hemoptysis.  Positive dyspnea.  CARDIOVASCULAR: No chest pain. No orthopnea. No palpitations. No syncope.  GASTROINTESTINAL: No nausea, no vomiting or diarrhea. No abdominal pain. No melena or hematochezia.  GENITOURINARY: No dysuria or hematuria.  ENDOCRINE: No polyuria or nocturia. No heat or cold intolerance.  HEMATOLOGY: No anemia. No bruising. No bleeding.  INTEGUMENTARY: No rashes. No lesions.  MUSCULOSKELETAL: No arthritis. No swelling. No gout.  NEUROLOGIC: No numbness, tingling, or ataxia. No seizure-type activity.  PSYCHIATRIC: No anxiety. No insomnia. No ADD.    Vitals:   Vitals:   06/02/19 1345 06/02/19 1400 06/02/19 1415 06/02/19 1423  BP: 126/60 (!) 116/52 (!) 120/56 (!) 110/47  Pulse: 96 97 95 97  Resp: (!) 21 19 20  (!) 23  Temp:      TempSrc:      SpO2: 97% 96% 97% 96%  Weight:      Height:        Wt Readings from Last 3 Encounters:  06/02/19 81.8 kg  05/18/19 79.4 kg  05/16/19 80.7 kg     Intake/Output Summary (Last 24 hours) at 06/02/2019 1501 Last data filed at 06/02/2019 1052 Gross per 24 hour  Intake 360 ml  Output 500 ml  Net -140 ml    Physical Exam:    GENERAL: Pleasant-appearing in no apparent distress.  HEAD, EYES, EARS, NOSE AND THROAT: Atraumatic, normocephalic. Extraocular muscles are intact. Pupils equal and reactive to light. Sclerae anicteric. No conjunctival injection. No oro-pharyngeal erythema.  NECK: Supple. There is no jugular venous distention. No bruits, no lymphadenopathy, no thyromegaly.  HEART: Regular rate and rhythm,. No murmurs, no rubs, no clicks.  LUNGS: Clear to auscultation bilaterally. No rales or rhonchi. No wheezes.  ABDOMEN: Soft, flat, nontender, nondistended. Has good bowel sounds. No hepatosplenomegaly appreciated.  EXTREMITIES: No evidence of any cyanosis, clubbing, or  positive peripheral edema.  +2 pedal and radial pulses bilaterally.  NEUROLOGIC: The patient is alert, awake, and oriented x3 with no focal motor or sensory deficits appreciated bilaterally.  SKIN: Moist and warm with no rashes appreciated.  Psych: Not anxious, depressed LN: No inguinal LN enlargement    Antibiotics   Anti-infectives (From admission, onward)   Start     Dose/Rate Route Frequency Ordered Stop   06/01/19 1515  clindamycin (CLEOCIN) IVPB 300 mg     300 mg 100 mL/hr over 30 Minutes Intravenous  Once 06/01/19 1508 06/01/19 1630      Medications   Scheduled Meds: . allopurinol  100 mg Oral BID  . aspirin EC  81 mg Oral Daily  . Chlorhexidine Gluconate Cloth  6 each Topical Q0600  . colchicine  0.3 mg Oral Once per day on Mon Wed Fri  . [START ON 06/03/2019] epoetin (EPOGEN/PROCRIT) injection  10,000 Units Intravenous Q T,Th,Sa-HD  . heparin  5,000 Units Subcutaneous Q8H  . loratadine  10 mg Oral Daily  . pravastatin  20 mg Oral QHS  . sodium chloride flush  3 mL Intravenous Q12H  . tuberculin  5 Units Intradermal Once   Continuous Infusions: . sodium chloride     PRN Meds:.sodium chloride, acetaminophen, albuterol, guaiFENesin-dextromethorphan, HYDROmorphone (DILAUDID) injection, ondansetron (ZOFRAN) IV, sodium  chloride flush   Data Review:   Micro Results Recent Results (from the past 240 hour(s))  SARS Coronavirus 2 (CEPHEID - Performed in [place] hospital lab), Hosp Order     Status: None   Collection Time: 05/30/19  1:15 PM   Specimen: Nasopharyngeal Swab  Result Value Ref Range Status   SARS Coronavirus 2 NEGATIVE NEGATIVE Final    Comment: (NOTE) If result is NEGATIVE SARS-CoV-2 target nucleic acids are NOT DETECTED. The SARS-CoV-2 RNA is generally detectable in upper and lower  respiratory specimens during the acute phase of infection. The lowest  concentration of SARS-CoV-2 viral copies this assay can detect is 250  copies / mL. A negative result does not preclude SARS-CoV-2 infection  and should not be used as the sole basis for treatment or other  patient management decisions.  A negative result may occur with  improper specimen collection / handling, submission of specimen other  than nasopharyngeal swab, presence of viral mutation(s) within the  areas targeted by this assay, and inadequate number of viral copies  (<250 copies / mL). A negative result must be combined with clinical  observations, patient history, and epidemiological information. If result is POSITIVE SARS-CoV-2 target nucleic acids are DETECTED. The SARS-CoV-2 RNA is generally detectable in upper and lower  respiratory specimens dur ing the acute phase of infection.  Positive  results are indicative of active infection with SARS-CoV-2.  Clinical  correlation with patient history and other diagnostic information is  necessary to determine patient infection status.  Positive results do  not rule out bacterial infection or co-infection with other viruses. If result is PRESUMPTIVE POSTIVE SARS-CoV-2 nucleic acids MAY BE PRESENT.   A presumptive positive result was obtained on the submitted specimen  and confirmed on repeat testing.  While 2019 novel coronavirus  (SARS-CoV-2) nucleic acids may be present in  the submitted sample  additional confirmatory testing may be necessary for epidemiological  and / or clinical management purposes  to differentiate between  SARS-CoV-2 and other Sarbecovirus currently known to infect humans.  If clinically indicated additional testing with an alternate test  methodology [phone]) is advised. The SARS-CoV-2 RNA  is generally  detectable in upper and lower respiratory sp ecimens during the acute  phase of infection. The expected result is Negative. Fact Sheet for Patients:  StrictlyIdeas.no Fact Sheet for Healthcare Providers: BankingDealers.co.za This test is not yet approved or cleared by the [place] FDA and has been authorized for detection and/or diagnosis of SARS-CoV-2 by FDA under an Emergency Use Authorization (EUA).  This EUA will remain in effect (meaning this test can be used) for the duration of the COVID-19 declaration under Section 564(b)(1) of the Act, 21 U.S.C. section 360bbb-3(b)(1), unless the authorization is terminated or revoked sooner. Performed at [facility], [address], [place], [place] [address]     Radiology Reports Dg Chest [place] 1 View  Result Date: 05/09/2019 CLINICAL DATA:  Follow-up infiltrates EXAM: PORTABLE CHEST 1 VIEW COMPARISON:  05/08/2019 FINDINGS: Cardiac shadow is stable. Aortic calcifications are again seen. Bibasilar opacities are again seen and stable. Some improved vascular congestion is noted. No sizable effusion is noted. No bony abnormality is seen. IMPRESSION: Persistent bibasilar infiltrates. Electronically Signed   By: Inez Catalina M.D.   On: 05/09/2019 07:49   Dg Chest Portable 1 View  Result Date: 05/08/2019 CLINICAL DATA:  85 year old female with increased shortness of breath. Test for COVID-19 pending. EXAM: PORTABLE CHEST 1 VIEW COMPARISON:  01/26/2019 and earlier. FINDINGS: Portable AP upright view at 1946 hours. Acute on chronic  bilateral pulmonary interstitial opacity. Lung volumes are lower. Stable cardiac size and mediastinal contours. Visualized tracheal air column is within normal limits. No pneumothorax, pleural effusion or consolidation. Paucity of bowel gas in the upper abdomen. No acute osseous abnormality identified. IMPRESSION: Lower lung volumes with acute on chronic pulmonary interstitial opacity. Consider viral/atypical respiratory infection and pulmonary interstitial edema. Electronically Signed   By: Genevie Ann M.D.   On: 05/08/2019 20:07     CBC Recent Labs  Lab 05/30/19 1252 05/31/19 0500  WBC 5.1 4.3  HGB 8.7* 8.2*  HCT 26.7* 25.6*  PLT 156 150  MCV 92.7 93.1  MCH 30.2 29.8  MCHC 32.6 32.0  RDW 14.5 14.6    Chemistries  Recent Labs  Lab 05/30/19 1252 05/31/19 0500 06/01/19 0456 06/02/19 0604  NA 125* 125* 130* 132*  K 5.1 5.1 4.4 4.2  CL 91* 93* 96* 97*  CO2 17* 19* 20* 22  GLUCOSE 124* 109* 96 95  BUN 113* 115* 95* 69*  CREATININE 6.16*  6.20* 6.58* 5.22* 3.77*  CALCIUM 9.0 8.5* 8.7* 8.7*  AST 32  --   --   --   ALT 18  --   --   --   ALKPHOS 59  --   --   --   BILITOT 0.9  --   --   --    ------------------------------------------------------------------------------------------------------------------ estimated creatinine clearance is 10.7 mL/min (A) (by C-G formula based on SCr of 3.77 mg/dL (H)). ------------------------------------------------------------------------------------------------------------------ No results for input(s): HGBA1C in the last 72 hours. ------------------------------------------------------------------------------------------------------------------ No results for input(s): CHOL, HDL, LDLCALC, TRIG, CHOLHDL, LDLDIRECT in the last 72 hours. ------------------------------------------------------------------------------------------------------------------ No results for input(s): TSH, T4TOTAL, T3FREE, THYROIDAB in the last 72 hours.  Invalid input(s):  FREET3 ------------------------------------------------------------------------------------------------------------------ No results for input(s): VITAMINB12, FOLATE, FERRITIN, TIBC, IRON, RETICCTPCT in the last 72 hours.  Coagulation profile No results for input(s): INR, PROTIME in the last 168 hours.  No results for input(s): DDIMER in the last 72 hours.  Cardiac Enzymes No results for input(s): CKMB, TROPONINI, MYOGLOBIN in the last 168 hours.  Invalid  input(s): CK ------------------------------------------------------------------------------------------------------------------ Invalid input(s): [place]   Patient is a 85 year old white female with diastolic CHF as well as chronic kidney disease as well as moderate to severe aortic stenosis presenting with shortness of breath  1.  Acute on chronic diastolic CHF in setting of moderate to severe aortic stenosis Seen by cardiology supportive care  2.  Acute kidney injury on chronic kidney disease not much urine output patient to be started on hemodialysis Patient receiving hemodialysis Urine is picking up   3.  Essential hypertension continue to monitor for now   4.  Hyperlipidemia continue Pravachol  5.  Moderate to severe aortic stenosis further management per cardiology   6.  Miscellaneous heparin for DVT prophylaxis      Code Status Orders  (From admission, onward)         Start     Ordered   05/30/19 1159  Limited resuscitation (code)  Continuous    Question Answer Comment  In the event of cardiac or respiratory ARREST: Initiate Code Blue, Call Rapid Response Yes   In the event of cardiac or respiratory ARREST: Perform CPR Yes   In the event of cardiac or respiratory ARREST: Perform Intubation/Mechanical Ventilation No   In the event of cardiac or respiratory ARREST: Use NIPPV/BiPAp only if indicated Yes   In the event of cardiac or respiratory ARREST: Administer ACLS medications  if indicated Yes   In the event of cardiac or respiratory ARREST: Perform Defibrillation or Cardioversion if indicated Yes      05/30/19 1158        Code Status History    Date Active Date Inactive Code Status Order ID Comments User Context   05/09/2019 0002 05/10/2019 1441 Full Code [id number]  Mayer Camel, NP Inpatient   10/16/2017 1237 10/20/2017 1859 Full Code [id number]  Gladstone Lighter, MD Inpatient   Advance Care Planning Activity           Consults cardiology  DVT Prophylaxis Heparin Lab Results  Component Value Date   PLT 150 05/31/2019     Time Spent in minutes 35 minutes greater than 50% of time spent in care coordination and counseling patient regarding the condition and plan of care.   Dustin Flock M.D on 06/02/2019 at 3:01 PM  Between 7am to 6pm - Pager - [phone]  After 6pm go to [email or web address] - Proofreader  [employer]   Office  [phone]

## 2019-06-02 NOTE — Plan of Care (Signed)
  Problem: Education: Goal: Knowledge of disease and its progression will improve Outcome: Progressing   Problem: Clinical Measurements: Goal: Dialysis access will remain free of complications Outcome: Progressing   Problem: Activity: Goal: Risk for activity intolerance will decrease Outcome: Progressing

## 2019-06-03 LAB — BASIC METABOLIC PANEL
Anion gap: 14 (ref 5–15)
BUN: 40 mg/dL — ABNORMAL HIGH (ref 8–23)
CO2: 26 mmol/L (ref 22–32)
Calcium: 9.1 mg/dL (ref 8.9–10.3)
Chloride: 97 mmol/L — ABNORMAL LOW (ref 98–111)
Creatinine, Ser: 2.63 mg/dL — ABNORMAL HIGH (ref 0.44–1.00)
GFR calc Af Amer: 18 mL/min — ABNORMAL LOW (ref >60)
GFR calc non Af Amer: 16 mL/min — ABNORMAL LOW (ref >60)
Glucose, Bld: 103 mg/dL — ABNORMAL HIGH (ref 70–99)
Potassium: 4.2 mmol/L (ref 3.5–5.1)
Sodium: 137 mmol/L (ref 135–145)

## 2019-06-03 MED ORDER — ALPRAZOLAM 0.5 MG PO TABS
0.5000 mg | ORAL_TABLET | ORAL | Status: AC
  Administered 2019-06-03: 0.5 mg via ORAL
  Filled 2019-06-03: qty 1

## 2019-06-03 NOTE — Progress Notes (Signed)
Pt requesting for anxiety medicine. Page prime and talked to Levada Dy and ordered Xanax 0.5 mg oral now. Will continue to monitor.

## 2019-06-03 NOTE — Progress Notes (Signed)
[facility] at [facility]                                                                                                                                                                                  Patient Demographics   Lori Johnson, is a 85 y.o. female, DOB - 1930/01/30, ALP:[id number]  Admit date - 05/30/2019   Admitting Physician Gladstone Lighter, MD  Outpatient Primary MD for the patient is Chrismon, Vickki Muff, PA   LOS - 4  Subjective: Patient continues to be short of breath will have dialysis later    Review of Systems:   CONSTITUTIONAL: No documented fever. No fatigue, weakness. No weight gain, no weight loss.  EYES: No blurry or double vision.  ENT: No tinnitus. No postnasal drip. No redness of the oropharynx.  RESPIRATORY: No cough, no wheeze, no hemoptysis.  Positive dyspnea.  CARDIOVASCULAR: No chest pain. No orthopnea. No palpitations. No syncope.  GASTROINTESTINAL: No nausea, no vomiting or diarrhea. No abdominal pain. No melena or hematochezia.  GENITOURINARY: No dysuria or hematuria.  ENDOCRINE: No polyuria or nocturia. No heat or cold intolerance.  HEMATOLOGY: No anemia. No bruising. No bleeding.  INTEGUMENTARY: No rashes. No lesions.  MUSCULOSKELETAL: No arthritis. No swelling. No gout.  NEUROLOGIC: No numbness, tingling, or ataxia. No seizure-type activity.  PSYCHIATRIC: No anxiety. No insomnia. No ADD.    Vitals:   Vitals:   06/02/19 1705 06/02/19 1943 06/03/19 0442 06/03/19 0737  BP: 121/63 (!) 126/56 131/64 (!) 123/58  Pulse: 87 86 87 85  Resp: 19   18  Temp: 98.2 F (36.8 C) 98.1 F (36.7 C) 98.2 F (36.8 C) 97.9 F (36.6 C)  TempSrc: Oral Oral Oral Oral  SpO2: 97% 95% 100% 97%  Weight:   84.6 kg   Height:        Wt Readings from Last 3 Encounters:  06/03/19 84.6 kg  05/18/19 79.4 kg  05/16/19 80.7 kg     Intake/Output Summary (Last 24 hours) at 06/03/2019 1237 Last data filed at 06/03/2019 0449 Gross per 24  hour  Intake -  Output 802 ml  Net -802 ml    Physical Exam:   GENERAL: Pleasant-appearing in no apparent distress.  HEAD, EYES, EARS, NOSE AND THROAT: Atraumatic, normocephalic. Extraocular muscles are intact. Pupils equal and reactive to light. Sclerae anicteric. No conjunctival injection. No oro-pharyngeal erythema.  NECK: Supple. There is no jugular venous distention. No bruits, no lymphadenopathy, no thyromegaly.  HEART: Regular rate and rhythm,. No murmurs, no rubs, no clicks.  LUNGS: Clear to auscultation bilaterally. No rales or rhonchi. No wheezes.  ABDOMEN: Soft, flat, nontender, nondistended. Has good bowel sounds.  No hepatosplenomegaly appreciated.  EXTREMITIES: No evidence of any cyanosis, clubbing, or positive peripheral edema.  +2 pedal and radial pulses bilaterally.  NEUROLOGIC: The patient is alert, awake, and oriented x3 with no focal motor or sensory deficits appreciated bilaterally.  SKIN: Moist and warm with no rashes appreciated.  Psych: Not anxious, depressed LN: No inguinal LN enlargement    Antibiotics   Anti-infectives (From admission, onward)   Start     Dose/Rate Route Frequency Ordered Stop   06/01/19 1515  clindamycin (CLEOCIN) IVPB 300 mg     300 mg 100 mL/hr over 30 Minutes Intravenous  Once 06/01/19 1508 06/01/19 1630      Medications   Scheduled Meds: . allopurinol  100 mg Oral BID  . aspirin EC  81 mg Oral Daily  . Chlorhexidine Gluconate Cloth  6 each Topical Q0600  . colchicine  0.3 mg Oral Once per day on Mon Wed Fri  . epoetin (EPOGEN/PROCRIT) injection  10,000 Units Intravenous Q T,Th,Sa-HD  . heparin  5,000 Units Subcutaneous Q8H  . loratadine  10 mg Oral Daily  . pravastatin  20 mg Oral QHS  . sodium chloride flush  3 mL Intravenous Q12H  . tuberculin  5 Units Intradermal Once   Continuous Infusions: . sodium chloride     PRN Meds:.sodium chloride, acetaminophen, albuterol, guaiFENesin-dextromethorphan, HYDROmorphone (DILAUDID)  injection, ondansetron (ZOFRAN) IV, sodium chloride flush   Data Review:   Micro Results Recent Results (from the past 240 hour(s))  SARS Coronavirus 2 (CEPHEID - Performed in [place] hospital lab), Hosp Order     Status: None   Collection Time: 05/30/19  1:15 PM   Specimen: Nasopharyngeal Swab  Result Value Ref Range Status   SARS Coronavirus 2 NEGATIVE NEGATIVE Final    Comment: (NOTE) If result is NEGATIVE SARS-CoV-2 target nucleic acids are NOT DETECTED. The SARS-CoV-2 RNA is generally detectable in upper and lower  respiratory specimens during the acute phase of infection. The lowest  concentration of SARS-CoV-2 viral copies this assay can detect is 250  copies / mL. A negative result does not preclude SARS-CoV-2 infection  and should not be used as the sole basis for treatment or other  patient management decisions.  A negative result may occur with  improper specimen collection / handling, submission of specimen other  than nasopharyngeal swab, presence of viral mutation(s) within the  areas targeted by this assay, and inadequate number of viral copies  (<250 copies / mL). A negative result must be combined with clinical  observations, patient history, and epidemiological information. If result is POSITIVE SARS-CoV-2 target nucleic acids are DETECTED. The SARS-CoV-2 RNA is generally detectable in upper and lower  respiratory specimens dur ing the acute phase of infection.  Positive  results are indicative of active infection with SARS-CoV-2.  Clinical  correlation with patient history and other diagnostic information is  necessary to determine patient infection status.  Positive results do  not rule out bacterial infection or co-infection with other viruses. If result is PRESUMPTIVE POSTIVE SARS-CoV-2 nucleic acids MAY BE PRESENT.   A presumptive positive result was obtained on the submitted specimen  and confirmed on repeat testing.  While 2019 novel coronavirus   (SARS-CoV-2) nucleic acids may be present in the submitted sample  additional confirmatory testing may be necessary for epidemiological  and / or clinical management purposes  to differentiate between  SARS-CoV-2 and other Sarbecovirus currently known to infect humans.  If clinically indicated additional testing with an alternate  test  methodology [phone]) is advised. The SARS-CoV-2 RNA is generally  detectable in upper and lower respiratory sp ecimens during the acute  phase of infection. The expected result is Negative. Fact Sheet for Patients:  StrictlyIdeas.no Fact Sheet for Healthcare Providers: BankingDealers.co.za This test is not yet approved or cleared by the [place] FDA and has been authorized for detection and/or diagnosis of SARS-CoV-2 by FDA under an Emergency Use Authorization (EUA).  This EUA will remain in effect (meaning this test can be used) for the duration of the COVID-19 declaration under Section 564(b)(1) of the Act, 21 U.S.C. section 360bbb-3(b)(1), unless the authorization is terminated or revoked sooner. Performed at [facility], [address] [place], [place] [address]     Radiology Reports Dg Chest [place] 1 View  Result Date: 05/09/2019 CLINICAL DATA:  Follow-up infiltrates EXAM: PORTABLE CHEST 1 VIEW COMPARISON:  05/08/2019 FINDINGS: Cardiac shadow is stable. Aortic calcifications are again seen. Bibasilar opacities are again seen and stable. Some improved vascular congestion is noted. No sizable effusion is noted. No bony abnormality is seen. IMPRESSION: Persistent bibasilar infiltrates. Electronically Signed   By: Inez Catalina M.D.   On: 05/09/2019 07:49   Dg Chest Portable 1 View  Result Date: 05/08/2019 CLINICAL DATA:  85 year old female with increased shortness of breath. Test for COVID-19 pending. EXAM: PORTABLE CHEST 1 VIEW COMPARISON:  01/26/2019 and earlier. FINDINGS: Portable AP  upright view at 1946 hours. Acute on chronic bilateral pulmonary interstitial opacity. Lung volumes are lower. Stable cardiac size and mediastinal contours. Visualized tracheal air column is within normal limits. No pneumothorax, pleural effusion or consolidation. Paucity of bowel gas in the upper abdomen. No acute osseous abnormality identified. IMPRESSION: Lower lung volumes with acute on chronic pulmonary interstitial opacity. Consider viral/atypical respiratory infection and pulmonary interstitial edema. Electronically Signed   By: Genevie Ann M.D.   On: 05/08/2019 20:07     CBC Recent Labs  Lab 05/30/19 1252 05/31/19 0500  WBC 5.1 4.3  HGB 8.7* 8.2*  HCT 26.7* 25.6*  PLT 156 150  MCV 92.7 93.1  MCH 30.2 29.8  MCHC 32.6 32.0  RDW 14.5 14.6    Chemistries  Recent Labs  Lab 05/30/19 1252 05/31/19 0500 06/01/19 0456 06/02/19 0604 06/03/19 0609  NA 125* 125* 130* 132* 137  K 5.1 5.1 4.4 4.2 4.2  CL 91* 93* 96* 97* 97*  CO2 17* 19* 20* 22 26  GLUCOSE 124* 109* 96 95 103*  BUN 113* 115* 95* 69* 40*  CREATININE 6.16*  6.20* 6.58* 5.22* 3.77* 2.63*  CALCIUM 9.0 8.5* 8.7* 8.7* 9.1  AST 32  --   --   --   --   ALT 18  --   --   --   --   ALKPHOS 59  --   --   --   --   BILITOT 0.9  --   --   --   --    ------------------------------------------------------------------------------------------------------------------ estimated creatinine clearance is 15.5 mL/min (A) (by C-G formula based on SCr of 2.63 mg/dL (H)). ------------------------------------------------------------------------------------------------------------------ No results for input(s): HGBA1C in the last 72 hours. ------------------------------------------------------------------------------------------------------------------ No results for input(s): CHOL, HDL, LDLCALC, TRIG, CHOLHDL, LDLDIRECT in the last 72  hours. ------------------------------------------------------------------------------------------------------------------ No results for input(s): TSH, T4TOTAL, T3FREE, THYROIDAB in the last 72 hours.  Invalid input(s): FREET3 ------------------------------------------------------------------------------------------------------------------ No results for input(s): VITAMINB12, FOLATE, FERRITIN, TIBC, IRON, RETICCTPCT in the last 72 hours.  Coagulation profile No results for input(s): INR, PROTIME in the  last 168 hours.  No results for input(s): DDIMER in the last 72 hours.  Cardiac Enzymes No results for input(s): CKMB, TROPONINI, MYOGLOBIN in the last 168 hours.  Invalid input(s): CK ------------------------------------------------------------------------------------------------------------------ Invalid input(s): [place]   Patient is a 85 year old white female with diastolic CHF as well as chronic kidney disease as well as moderate to severe aortic stenosis presenting with shortness of breath  1.  Acute on chronic diastolic CHF in setting of moderate to severe aortic stenosis Seen by cardiology supportive care  2.  Acute kidney injury on chronic kidney disease not much urine output patient to be started on hemodialysis Patient receiving hemodialysis Patient will need to remain in hospital  3.  Essential hypertension continue to monitor for now   4.  Hyperlipidemia continue Pravachol  5.  Moderate to severe aortic stenosis further management per cardiology   6.  Miscellaneous heparin for DVT prophylaxis      Code Status Orders  (From admission, onward)         Start     Ordered   05/30/19 1159  Limited resuscitation (code)  Continuous    Question Answer Comment  In the event of cardiac or respiratory ARREST: Initiate Code Blue, Call Rapid Response Yes   In the event of cardiac or respiratory ARREST: Perform CPR Yes   In the event of  cardiac or respiratory ARREST: Perform Intubation/Mechanical Ventilation No   In the event of cardiac or respiratory ARREST: Use NIPPV/BiPAp only if indicated Yes   In the event of cardiac or respiratory ARREST: Administer ACLS medications if indicated Yes   In the event of cardiac or respiratory ARREST: Perform Defibrillation or Cardioversion if indicated Yes      05/30/19 1158        Code Status History    Date Active Date Inactive Code Status Order ID Comments User Context   05/09/2019 0002 05/10/2019 1441 Full Code [id number]  Mayer Camel, NP Inpatient   10/16/2017 1237 10/20/2017 1859 Full Code [id number]  Gladstone Lighter, MD Inpatient   Advance Care Planning Activity           Consults cardiology  DVT Prophylaxis Heparin Lab Results  Component Value Date   PLT 150 05/31/2019     Time Spent in minutes 35 minutes greater than 50% of time spent in care coordination and counseling patient regarding the condition and plan of care.   Dustin Flock M.D on 06/03/2019 at 12:37 PM  Between 7am to 6pm - Pager - [phone]  After 6pm go to [email or web address] - Proofreader  [employer]   Office  [phone]

## 2019-06-03 NOTE — Plan of Care (Signed)
  Problem: Education: Goal: Knowledge of disease and its progression will improve Outcome: Progressing   Problem: Health Behavior/Discharge Planning: Goal: Ability to manage health-related needs will improve Outcome: Progressing   Problem: Clinical Measurements: Goal: Complications related to the disease process or treatment will be avoided or minimized Outcome: Progressing Goal: Dialysis access will remain free of complications Outcome: Progressing   Problem: Activity: Goal: Activity intolerance will improve Outcome: Progressing   Problem: Fluid Volume: Goal: Fluid volume balance will be maintained or improved Outcome: Progressing   Problem: Nutritional: Goal: Ability to make appropriate dietary choices will improve Outcome: Progressing   Problem: Respiratory: Goal: Respiratory symptoms related to disease process will be avoided Outcome: Progressing   Problem: Self-Concept: Goal: Body image disturbance will be avoided or minimized Outcome: Progressing   Problem: Urinary Elimination: Goal: Progression of disease will be identified and treated Outcome: Progressing   Problem: Education: Goal: Ability to demonstrate management of disease process will improve Outcome: Progressing Goal: Ability to verbalize understanding of medication therapies will improve Outcome: Progressing Goal: Individualized Educational Video(s) Outcome: Progressing   Problem: Activity: Goal: Capacity to carry out activities will improve Outcome: Progressing   Problem: Cardiac: Goal: Ability to achieve and maintain adequate cardiopulmonary perfusion will improve Outcome: Progressing   Problem: Education: Goal: Knowledge of General Education information will improve Description: Including pain rating scale, medication(s)/side effects and non-pharmacologic comfort measures Outcome: Progressing   Problem: Health Behavior/Discharge Planning: Goal: Ability to manage health-related needs will  improve Outcome: Progressing   Problem: Clinical Measurements: Goal: Ability to maintain clinical measurements within normal limits will improve Outcome: Progressing Goal: Will remain free from infection Outcome: Progressing Goal: Diagnostic test results will improve Outcome: Progressing Goal: Respiratory complications will improve Outcome: Progressing Goal: Cardiovascular complication will be avoided Outcome: Progressing   Problem: Activity: Goal: Risk for activity intolerance will decrease Outcome: Progressing   Problem: Nutrition: Goal: Adequate nutrition will be maintained Outcome: Progressing   Problem: Coping: Goal: Level of anxiety will decrease Outcome: Progressing   Problem: Elimination: Goal: Will not experience complications related to bowel motility Outcome: Progressing Goal: Will not experience complications related to urinary retention Outcome: Progressing   Problem: Pain Managment: Goal: General experience of comfort will improve Outcome: Progressing   Problem: Safety: Goal: Ability to remain free from injury will improve Outcome: Progressing   Problem: Skin Integrity: Goal: Risk for impaired skin integrity will decrease Outcome: Progressing

## 2019-06-03 NOTE — Progress Notes (Signed)
[facility]  ROUNDING NOTE   Subjective:  Patient overall feeling better after the initiation of dialysis. Tolerated yesterday's treatment better than the one prior.   Objective:  Vital signs in last 24 hours:  Temp:  [97.9 F (36.6 C)-98.3 F (36.8 C)] 97.9 F (36.6 C) (07/04 0737) Pulse Rate:  [85-99] 85 (07/04 0737) Resp:  [18-28] 18 (07/04 0737) BP: (106-131)/(47-71) 123/58 (07/04 0737) SpO2:  [93 %-100 %] 97 % (07/04 0737) Weight:  [81.7 kg-85.2 kg] 84.6 kg (07/04 0442)  Weight change: 0.1 kg Filed Weights   06/02/19 1545 06/02/19 1704 06/03/19 0442  Weight: 81.7 kg 85.2 kg 84.6 kg    Intake/Output: I/O last 3 completed shifts: In: 360 [P.O.:360] Out: 1402 [Urine:900; Other:502]   Intake/Output this shift:  No intake/output data recorded.  Physical Exam: General: No acute distress  Head: Normocephalic, atraumatic. Moist oral mucosal membranes  Eyes: Anicteric  Neck: Supple, trachea midline  Lungs:  Clear to auscultation, normal effort  Heart: S1S2 no rubs 2/6 SEM  Abdomen:  Soft, nontender, bowel sounds present  Extremities: 2+ peripheral edema.  Neurologic: Awake, alert, following commands  Skin: No lesions  Access: Right internal jugular PermCath    Basic Metabolic Panel: Recent Labs  Lab 05/30/19 1252 05/31/19 0500 06/01/19 0456 06/01/19 1030 06/02/19 0604 06/03/19 0609  NA 125* 125* 130*  --  132* 137  K 5.1 5.1 4.4  --  4.2 4.2  CL 91* 93* 96*  --  97* 97*  CO2 17* 19* 20*  --  22 26  GLUCOSE 124* 109* 96  --  95 103*  BUN 113* 115* 95*  --  69* 40*  CREATININE 6.16*  6.20* 6.58* 5.22*  --  3.77* 2.63*  CALCIUM 9.0 8.5* 8.7*  --  8.7* 9.1  PHOS  --   --   --  6.9* 5.4*  --     Liver Function Tests: Recent Labs  Lab 05/30/19 1252  AST 32  ALT 18  ALKPHOS 59  BILITOT 0.9  PROT 6.5  ALBUMIN 3.9   No results for input(s): LIPASE, AMYLASE in the last 168 hours. No results for input(s): AMMONIA in the last 168  hours.  CBC: Recent Labs  Lab 05/30/19 1252 05/31/19 0500  WBC 5.1 4.3  HGB 8.7* 8.2*  HCT 26.7* 25.6*  MCV 92.7 93.1  PLT 156 150    Cardiac Enzymes: No results for input(s): CKTOTAL, CKMB, CKMBINDEX, TROPONINI in the last 168 hours.  BNP: Invalid input(s): POCBNP  CBG: No results for input(s): GLUCAP in the last 168 hours.  Microbiology: Results for orders placed or performed during the hospital encounter of 05/30/19  SARS Coronavirus 2 (CEPHEID - Performed in [place] hospital lab), Hosp Order     Status: None   Collection Time: 05/30/19  1:15 PM   Specimen: Nasopharyngeal Swab  Result Value Ref Range Status   SARS Coronavirus 2 NEGATIVE NEGATIVE Final    Comment: (NOTE) If result is NEGATIVE SARS-CoV-2 target nucleic acids are NOT DETECTED. The SARS-CoV-2 RNA is generally detectable in upper and lower  respiratory specimens during the acute phase of infection. The lowest  concentration of SARS-CoV-2 viral copies this assay can detect is 250  copies / mL. A negative result does not preclude SARS-CoV-2 infection  and should not be used as the sole basis for treatment or other  patient management decisions.  A negative result may occur with  improper specimen collection / handling, submission of specimen  other  than nasopharyngeal swab, presence of viral mutation(s) within the  areas targeted by this assay, and inadequate number of viral copies  (<250 copies / mL). A negative result must be combined with clinical  observations, patient history, and epidemiological information. If result is POSITIVE SARS-CoV-2 target nucleic acids are DETECTED. The SARS-CoV-2 RNA is generally detectable in upper and lower  respiratory specimens dur ing the acute phase of infection.  Positive  results are indicative of active infection with SARS-CoV-2.  Clinical  correlation with patient history and other diagnostic information is  necessary to determine patient infection status.   Positive results do  not rule out bacterial infection or co-infection with other viruses. If result is PRESUMPTIVE POSTIVE SARS-CoV-2 nucleic acids MAY BE PRESENT.   A presumptive positive result was obtained on the submitted specimen  and confirmed on repeat testing.  While 2019 novel coronavirus  (SARS-CoV-2) nucleic acids may be present in the submitted sample  additional confirmatory testing may be necessary for epidemiological  and / or clinical management purposes  to differentiate between  SARS-CoV-2 and other Sarbecovirus currently known to infect humans.  If clinically indicated additional testing with an alternate test  methodology [id number]) is advised. The SARS-CoV-2 RNA is generally  detectable in upper and lower respiratory sp ecimens during the acute  phase of infection. The expected result is Negative. Fact Sheet for Patients:  StrictlyIdeas.no Fact Sheet for Healthcare Providers: BankingDealers.co.za This test is not yet approved or cleared by the [place] FDA and has been authorized for detection and/or diagnosis of SARS-CoV-2 by FDA under an Emergency Use Authorization (EUA).  This EUA will remain in effect (meaning this test can be used) for the duration of the COVID-19 declaration under Section 564(b)(1) of the Act, 21 U.S.C. section 360bbb-3(b)(1), unless the authorization is terminated or revoked sooner. Performed at [facility], [address] [place], [place] [address]     Coagulation Studies: No results for input(s): LABPROT, INR in the last 72 hours.  Urinalysis: No results for input(s): COLORURINE, LABSPEC, PHURINE, GLUCOSEU, HGBUR, BILIRUBINUR, KETONESUR, PROTEINUR, UROBILINOGEN, NITRITE, LEUKOCYTESUR in the last 72 hours.  Invalid input(s): APPERANCEUR    Imaging: No results found.   Medications:   . sodium chloride     . allopurinol  100 mg Oral BID  . aspirin EC  81 mg Oral  Daily  . Chlorhexidine Gluconate Cloth  6 each Topical Q0600  . colchicine  0.3 mg Oral Once per day on Mon Wed Fri  . epoetin (EPOGEN/PROCRIT) injection  10,000 Units Intravenous Q T,Th,Sa-HD  . heparin  5,000 Units Subcutaneous Q8H  . loratadine  10 mg Oral Daily  . pravastatin  20 mg Oral QHS  . sodium chloride flush  3 mL Intravenous Q12H  . tuberculin  5 Units Intradermal Once   sodium chloride, acetaminophen, albuterol, guaiFENesin-dextromethorphan, HYDROmorphone (DILAUDID) injection, ondansetron (ZOFRAN) IV, sodium chloride flush  Assessment/ Plan:  85 y.o. female  with gout, severe peripheral vascular disease status post femoral bypass 15 years ago, several angioplasties, right leg DVT, hearing loss, hypertension, left eye macular degeneration, coronary disease with stent and angioplasty.  1.  Acute renal failure/chronic kidney disease stage IV baseline creatinine 2.4/proteinuria.  Urine output dropped to 300 cc over the preceding 24 hours.  Therefore we will need to continue dialysis at the moment.  Next treatment scheduled for today but secondary to increased dialysis volume we may need to perform this tomorrow.  2.  Anemia of chronic  kidney disease.  Patient will likely need Epogen as an outpatient.  3.  Secondary hyperparathyroidism.  Phosphorus down to 5.4.  Continue to monitor.  LOS: 4 Finlay Godbee 7/4/20202:35 PM

## 2019-06-04 LAB — BASIC METABOLIC PANEL
Anion gap: 11 (ref 5–15)
BUN: 45 mg/dL — ABNORMAL HIGH (ref 8–23)
CO2: 25 mmol/L (ref 22–32)
Calcium: 9.1 mg/dL (ref 8.9–10.3)
Chloride: 99 mmol/L (ref 98–111)
Creatinine, Ser: 2.89 mg/dL — ABNORMAL HIGH (ref 0.44–1.00)
GFR calc Af Amer: 16 mL/min — ABNORMAL LOW (ref >60)
GFR calc non Af Amer: 14 mL/min — ABNORMAL LOW (ref >60)
Glucose, Bld: 105 mg/dL — ABNORMAL HIGH (ref 70–99)
Potassium: 3.9 mmol/L (ref 3.5–5.1)
Sodium: 135 mmol/L (ref 135–145)

## 2019-06-04 LAB — PHOSPHORUS: Phosphorus: 3.7 mg/dL (ref 2.5–4.6)

## 2019-06-04 MED ORDER — ALPRAZOLAM 0.5 MG PO TABS
0.5000 mg | ORAL_TABLET | Freq: Every evening | ORAL | Status: DC | PRN
  Administered 2019-06-04: 0.5 mg via ORAL
  Filled 2019-06-04: qty 1

## 2019-06-04 NOTE — Progress Notes (Addendum)
HD Tx End   1 liter fluid removal.  Sat in chair for entire tx.    06/04/19 1345  Vital Signs  Pulse Rate (!) 106  BP 98/67  Oxygen Therapy  SpO2 96 %  O2 Device Nasal Cannula  O2 Flow Rate (L/min) 2 L/min  Pain Assessment  Pain Scale 0-10  Pain Score 0  During Hemodialysis Assessment  Blood Flow Rate (mL/min) 200 mL/min  Arterial Pressure (mmHg) -60 mmHg  Venous Pressure (mmHg) 80 mmHg  Transmembrane Pressure (mmHg) 40 mmHg  Ultrafiltration Rate (mL/min) 670 mL/min  Dialysate Flow Rate (mL/min) 500 ml/min  Conductivity: Machine  14  HD Safety Checks Performed Yes  Dialysis Fluid Bolus Normal Saline  Bolus Amount (mL) 250 mL  Intra-Hemodialysis Comments Tx completed;Tolerated well  Post-Hemodialysis Assessment  Rinseback Volume (mL) 250 mL  Dialyzer Clearance Lightly streaked  Hemodialysis Intake (mL) 500 mL  UF Total -Machine (mL) 1500 mL  Net UF (mL) 1000 mL  Tolerated HD Treatment Yes  Hemodialysis Catheter Right Subclavian  Placement Date/Time: 06/01/19 1605   Time Out: Correct patient;Correct site;Correct procedure  Maximum sterile barrier precautions: Hand hygiene;Sterile gloves;Cap;Large sterile sheet;Mask;Sterile gown  Site Prep: Chlorhexidine (preferred)  Local Anes...  Site Condition No complications  Blue Lumen Status Flushed;Capped (Central line);Heparin locked  Red Lumen Status Flushed;Heparin locked;Capped (Central line)  Purple Lumen Status N/A  Catheter fill solution Heparin 1000 units/ml  Dressing Type Biopatch;Occlusive  Dressing Status Clean;Dry;Intact  Drainage Description None  Post treatment catheter status Capped and Clamped

## 2019-06-04 NOTE — Progress Notes (Signed)
[place] at [facility]                                                                                                                                                                                  Patient Demographics   Lori Johnson, is a 85 y.o. female, DOB - 1929/12/05, DDU:[id number]  Admit date - 05/30/2019   Admitting Physician Gladstone Lighter, MD  Outpatient Primary MD for the patient is Chrismon, Vickki Muff, PA   LOS - 5  Subjective: Patient will have hemodialysis later today    Review of Systems:   CONSTITUTIONAL: No documented fever. No fatigue, weakness. No weight gain, no weight loss.  EYES: No blurry or double vision.  ENT: No tinnitus. No postnasal drip. No redness of the oropharynx.  RESPIRATORY: No cough, no wheeze, no hemoptysis.  Positive dyspnea.  CARDIOVASCULAR: No chest pain. No orthopnea. No palpitations. No syncope.  GASTROINTESTINAL: No nausea, no vomiting or diarrhea. No abdominal pain. No melena or hematochezia.  GENITOURINARY: No dysuria or hematuria.  ENDOCRINE: No polyuria or nocturia. No heat or cold intolerance.  HEMATOLOGY: No anemia. No bruising. No bleeding.  INTEGUMENTARY: No rashes. No lesions.  MUSCULOSKELETAL: No arthritis. No swelling. No gout.  NEUROLOGIC: No numbness, tingling, or ataxia. No seizure-type activity.  PSYCHIATRIC: No anxiety. No insomnia. No ADD.    Vitals:   Vitals:   06/04/19 1215 06/04/19 1230 06/04/19 1245 06/04/19 1300  BP: (!) 115/55 119/66 (!) 133/99 (!) 109/59  Pulse: 67 89 94 100  Resp: 20 18 16 16   Temp:      TempSrc:      SpO2: 99% 99% 94% 93%  Weight:      Height:        Wt Readings from Last 3 Encounters:  06/04/19 84.4 kg  05/18/19 79.4 kg  05/16/19 80.7 kg     Intake/Output Summary (Last 24 hours) at 06/04/2019 1308 Last data filed at 06/04/2019 0949 Gross per 24 hour  Intake 600 ml  Output 225 ml  Net 375 ml    Physical Exam:   GENERAL: Pleasant-appearing in  no apparent distress.  HEAD, EYES, EARS, NOSE AND THROAT: Atraumatic, normocephalic. Extraocular muscles are intact. Pupils equal and reactive to light. Sclerae anicteric. No conjunctival injection. No oro-pharyngeal erythema.  NECK: Supple. There is no jugular venous distention. No bruits, no lymphadenopathy, no thyromegaly.  HEART: Regular rate and rhythm,. No murmurs, no rubs, no clicks.  LUNGS: Clear to auscultation bilaterally. No rales or rhonchi. No wheezes.  ABDOMEN: Soft, flat, nontender, nondistended. Has good bowel sounds. No hepatosplenomegaly appreciated.  EXTREMITIES: No evidence of any cyanosis, clubbing, or positive peripheral edema.  +  2 pedal and radial pulses bilaterally.  NEUROLOGIC: The patient is alert, awake, and oriented x3 with no focal motor or sensory deficits appreciated bilaterally.  SKIN: Moist and warm with no rashes appreciated.  Psych: Not anxious, depressed LN: No inguinal LN enlargement    Antibiotics   Anti-infectives (From admission, onward)   Start     Dose/Rate Route Frequency Ordered Stop   06/01/19 1515  clindamycin (CLEOCIN) IVPB 300 mg     300 mg 100 mL/hr over 30 Minutes Intravenous  Once 06/01/19 1508 06/01/19 1630      Medications   Scheduled Meds: . allopurinol  100 mg Oral BID  . aspirin EC  81 mg Oral Daily  . Chlorhexidine Gluconate Cloth  6 each Topical Q0600  . colchicine  0.3 mg Oral Once per day on Mon Wed Fri  . epoetin (EPOGEN/PROCRIT) injection  10,000 Units Intravenous Q T,Th,Sa-HD  . heparin  5,000 Units Subcutaneous Q8H  . loratadine  10 mg Oral Daily  . pravastatin  20 mg Oral QHS  . sodium chloride flush  3 mL Intravenous Q12H   Continuous Infusions: . sodium chloride     PRN Meds:.sodium chloride, acetaminophen, albuterol, guaiFENesin-dextromethorphan, HYDROmorphone (DILAUDID) injection, ondansetron (ZOFRAN) IV, sodium chloride flush   Data Review:   Micro Results Recent Results (from the past 240 hour(s))   SARS Coronavirus 2 (CEPHEID - Performed in [facility] hospital lab), Hosp Order     Status: None   Collection Time: 05/30/19  1:15 PM   Specimen: Nasopharyngeal Swab  Result Value Ref Range Status   SARS Coronavirus 2 NEGATIVE NEGATIVE Final    Comment: (NOTE) If result is NEGATIVE SARS-CoV-2 target nucleic acids are NOT DETECTED. The SARS-CoV-2 RNA is generally detectable in upper and lower  respiratory specimens during the acute phase of infection. The lowest  concentration of SARS-CoV-2 viral copies this assay can detect is 250  copies / mL. A negative result does not preclude SARS-CoV-2 infection  and should not be used as the sole basis for treatment or other  patient management decisions.  A negative result may occur with  improper specimen collection / handling, submission of specimen other  than nasopharyngeal swab, presence of viral mutation(s) within the  areas targeted by this assay, and inadequate number of viral copies  (<250 copies / mL). A negative result must be combined with clinical  observations, patient history, and epidemiological information. If result is POSITIVE SARS-CoV-2 target nucleic acids are DETECTED. The SARS-CoV-2 RNA is generally detectable in upper and lower  respiratory specimens dur ing the acute phase of infection.  Positive  results are indicative of active infection with SARS-CoV-2.  Clinical  correlation with patient history and other diagnostic information is  necessary to determine patient infection status.  Positive results do  not rule out bacterial infection or co-infection with other viruses. If result is PRESUMPTIVE POSTIVE SARS-CoV-2 nucleic acids MAY BE PRESENT.   A presumptive positive result was obtained on the submitted specimen  and confirmed on repeat testing.  While 2019 novel coronavirus  (SARS-CoV-2) nucleic acids may be present in the submitted sample  additional confirmatory testing may be necessary for epidemiological   and / or clinical management purposes  to differentiate between  SARS-CoV-2 and other Sarbecovirus currently known to infect humans.  If clinically indicated additional testing with an alternate test  methodology [phone]) is advised. The SARS-CoV-2 RNA is generally  detectable in upper and lower respiratory sp ecimens during the acute  phase of infection. The expected result is Negative. Fact Sheet for Patients:  StrictlyIdeas.no Fact Sheet for Healthcare Providers: BankingDealers.co.za This test is not yet approved or cleared by the [place] FDA and has been authorized for detection and/or diagnosis of SARS-CoV-2 by FDA under an Emergency Use Authorization (EUA).  This EUA will remain in effect (meaning this test can be used) for the duration of the COVID-19 declaration under Section 564(b)(1) of the Act, 21 U.S.C. section 360bbb-3(b)(1), unless the authorization is terminated or revoked sooner. Performed at [facility], [address] [place], [place] [address]     Radiology Reports Dg Chest [place] 1 View  Result Date: 05/09/2019 CLINICAL DATA:  Follow-up infiltrates EXAM: PORTABLE CHEST 1 VIEW COMPARISON:  05/08/2019 FINDINGS: Cardiac shadow is stable. Aortic calcifications are again seen. Bibasilar opacities are again seen and stable. Some improved vascular congestion is noted. No sizable effusion is noted. No bony abnormality is seen. IMPRESSION: Persistent bibasilar infiltrates. Electronically Signed   By: Inez Catalina M.D.   On: 05/09/2019 07:49   Dg Chest Portable 1 View  Result Date: 05/08/2019 CLINICAL DATA:  85 year old female with increased shortness of breath. Test for COVID-19 pending. EXAM: PORTABLE CHEST 1 VIEW COMPARISON:  01/26/2019 and earlier. FINDINGS: Portable AP upright view at 1946 hours. Acute on chronic bilateral pulmonary interstitial opacity. Lung volumes are lower. Stable cardiac size and  mediastinal contours. Visualized tracheal air column is within normal limits. No pneumothorax, pleural effusion or consolidation. Paucity of bowel gas in the upper abdomen. No acute osseous abnormality identified. IMPRESSION: Lower lung volumes with acute on chronic pulmonary interstitial opacity. Consider viral/atypical respiratory infection and pulmonary interstitial edema. Electronically Signed   By: Genevie Ann M.D.   On: 05/08/2019 20:07     CBC Recent Labs  Lab 05/30/19 1252 05/31/19 0500  WBC 5.1 4.3  HGB 8.7* 8.2*  HCT 26.7* 25.6*  PLT 156 150  MCV 92.7 93.1  MCH 30.2 29.8  MCHC 32.6 32.0  RDW 14.5 14.6    Chemistries  Recent Labs  Lab 05/30/19 1252 05/31/19 0500 06/01/19 0456 06/02/19 0604 06/03/19 0609 06/04/19 0629  NA 125* 125* 130* 132* 137 135  K 5.1 5.1 4.4 4.2 4.2 3.9  CL 91* 93* 96* 97* 97* 99  CO2 17* 19* 20* 22 26 25   GLUCOSE 124* 109* 96 95 103* 105*  BUN 113* 115* 95* 69* 40* 45*  CREATININE 6.16*  6.20* 6.58* 5.22* 3.77* 2.63* 2.89*  CALCIUM 9.0 8.5* 8.7* 8.7* 9.1 9.1  AST 32  --   --   --   --   --   ALT 18  --   --   --   --   --   ALKPHOS 59  --   --   --   --   --   BILITOT 0.9  --   --   --   --   --    ------------------------------------------------------------------------------------------------------------------ estimated creatinine clearance is 14.1 mL/min (A) (by C-G formula based on SCr of 2.89 mg/dL (H)). ------------------------------------------------------------------------------------------------------------------ No results for input(s): HGBA1C in the last 72 hours. ------------------------------------------------------------------------------------------------------------------ No results for input(s): CHOL, HDL, LDLCALC, TRIG, CHOLHDL, LDLDIRECT in the last 72 hours. ------------------------------------------------------------------------------------------------------------------ No results for input(s): TSH, T4TOTAL, T3FREE,  THYROIDAB in the last 72 hours.  Invalid input(s): FREET3 ------------------------------------------------------------------------------------------------------------------ No results for input(s): VITAMINB12, FOLATE, FERRITIN, TIBC, IRON, RETICCTPCT in the last 72 hours.  Coagulation profile No results for input(s): INR, PROTIME in the last 168  hours.  No results for input(s): DDIMER in the last 72 hours.  Cardiac Enzymes No results for input(s): CKMB, TROPONINI, MYOGLOBIN in the last 168 hours.  Invalid input(s): CK ------------------------------------------------------------------------------------------------------------------ Invalid input(s): Moran   Patient is a 85 year old white female with diastolic CHF as well as chronic kidney disease as well as moderate to severe aortic stenosis presenting with shortness of breath  1.  Acute on chronic diastolic CHF in setting of moderate to severe aortic stenosis Seen by cardiology supportive care  2.  Acute kidney injury on chronic kidney disease not much urine output patient to be started on hemodialysis Patient receiving hemodialysis Patient will need to remain in hospital  3.  Essential hypertension continue to monitor for now   4.  Hyperlipidemia continue Pravachol  5.  Moderate to severe aortic stenosis further management per cardiology   6.  Miscellaneous heparin for DVT prophylaxis      Code Status Orders  (From admission, onward)         Start     Ordered   05/30/19 1159  Limited resuscitation (code)  Continuous    Question Answer Comment  In the event of cardiac or respiratory ARREST: Initiate Code Blue, Call Rapid Response Yes   In the event of cardiac or respiratory ARREST: Perform CPR Yes   In the event of cardiac or respiratory ARREST: Perform Intubation/Mechanical Ventilation No   In the event of cardiac or respiratory ARREST: Use NIPPV/BiPAp only if indicated Yes   In  the event of cardiac or respiratory ARREST: Administer ACLS medications if indicated Yes   In the event of cardiac or respiratory ARREST: Perform Defibrillation or Cardioversion if indicated Yes      05/30/19 1158        Code Status History    Date Active Date Inactive Code Status Order ID Comments User Context   05/09/2019 0002 05/10/2019 1441 Full Code [id number]  Mayer Camel, NP Inpatient   10/16/2017 1237 10/20/2017 1859 Full Code [id number]  Gladstone Lighter, MD Inpatient   Advance Care Planning Activity           Consults cardiology  DVT Prophylaxis Heparin Lab Results  Component Value Date   PLT 150 05/31/2019     Time Spent in minutes 35 minutes greater than 50% of time spent in care coordination and counseling patient regarding the condition and plan of care.   Dustin Flock M.D on 06/04/2019 at 1:08 PM  Between 7am to 6pm - Pager - [phone]  After 6pm go to [email or web address] - Proofreader  [employer]   Office  [phone]

## 2019-06-04 NOTE — Progress Notes (Signed)
HD Tx Start   06/04/19 1100  Hand-Off documentation  Report given to (Full Name) Trellis Paganini RN  Report received from (Full Name) Darlene RN 2A  Vital Signs  Temp 97.7 F (36.5 C)  Temp Source Oral  Pulse Rate 60  Pulse Rate Source Monitor  Resp 20  BP 118/68  BP Location Left Arm  BP Method Automatic  Patient Position (if appropriate) Sitting  Oxygen Therapy  SpO2 98 %  O2 Device Nasal Cannula  O2 Flow Rate (L/min) 2 L/min  Pain Assessment  Pain Scale 0-10  Pain Score 0  Dialysis Weight  Weight  (unable to weigh, pt arrived in chair)  Time-Out for Hemodialysis  What Procedure? Hemodialysis  Pt Identifiers(min of two) First/Last Name;MRN/Account#  Correct Site? Yes  Correct Side? Yes  Correct Procedure? Yes  Consents Verified? Yes  Rad Studies Available? N/A  Engineer, civil (consulting) Number 2  Station Number 4  UF/Alarm Test Passed  Conductivity: Meter 13.8  Conductivity: Machine  14  pH 7.4  Reverse Osmosis Main  Normal Saline Lot Number [id number]  Dialyzer Lot Number [id number]  Disposable Set Lot Number [id number]  Machine Temperature 98.6 F (37 C)  Musician and Audible Yes  Blood Lines Intact and Secured Yes  Pre Treatment Patient Checks  Vascular access used during treatment Catheter  HD catheter dressing before treatment WDL  Patient is receiving dialysis in a chair Yes  Hepatitis B Surface Antigen Results Negative  Date Hepatitis B Surface Antigen Drawn 05/31/19  Hepatitis B Surface Antibody  (<10)  Date Hepatitis B Surface Antibody Drawn 05/31/19  Hemodialysis Consent Verified Yes  Hemodialysis Standing Orders Initiated Yes  ECG (Telemetry) Monitor On Yes  Prime Ordered Normal Saline  Length of  DialysisTreatment -hour(s) 2.5 Hour(s)  Dialysis Treatment Comments Na 140   Dialyzer Elisio 17H NR  Dialysate 3K;2.5 Ca  Dialysate Flow Ordered 500  Blood Flow Rate Ordered 250 mL/min  Ultrafiltration Goal 1 Liters  Pre Treatment Labs Phosphorus   Dialysis Blood Pressure Support Ordered Normal Saline  During Hemodialysis Assessment  Blood Flow Rate (mL/min) 250 mL/min  Arterial Pressure (mmHg) -90 mmHg  Venous Pressure (mmHg) 80 mmHg  Transmembrane Pressure (mmHg) 60 mmHg  Ultrafiltration Rate (mL/min) 670 mL/min  Dialysate Flow Rate (mL/min) 500 ml/min  Conductivity: Machine  250  HD Safety Checks Performed Yes  Dialysis Fluid Bolus Normal Saline  Bolus Amount (mL) 250 mL  Intra-Hemodialysis Comments Tx initiated  Education / Care Plan  Dialysis Education Provided Yes  Documented Education in Care Plan Yes  Hemodialysis Catheter Right Subclavian  Placement Date/Time: 06/01/19 1605   Time Out: Correct patient;Correct site;Correct procedure  Maximum sterile barrier precautions: Hand hygiene;Sterile gloves;Cap;Large sterile sheet;Mask;Sterile gown  Site Prep: Chlorhexidine (preferred)  Local Anes...  Site Condition No complications  Blue Lumen Status Infusing  Red Lumen Status Infusing  Dressing Type Biopatch;Occlusive  Dressing Status Clean;Dry;Intact  Drainage Description None

## 2019-06-04 NOTE — Progress Notes (Signed)
[facility]  ROUNDING NOTE   Subjective:  Patient was unable to receive dialysis yesterday given high volume of dialysis treatments yesterday. She will be undergoing dialysis today. Appears to be in good spirits at the moment.   Objective:  Vital signs in last 24 hours:  Temp:  [97.3 F (36.3 C)-98.8 F (37.1 C)] 97.7 F (36.5 C) (07/05 1100) Pulse Rate:  [60-106] 79 (07/05 1350) Resp:  [16-20] 18 (07/05 1315) BP: (98-133)/(45-99) 129/63 (07/05 1350) SpO2:  [93 %-100 %] 95 % (07/05 1350) Weight:  [84.4 kg] 84.4 kg (07/05 0333)  Weight change: 2.569 kg Filed Weights   06/02/19 1704 06/03/19 0442 06/04/19 0333  Weight: 85.2 kg 84.6 kg 84.4 kg    Intake/Output: I/O last 3 completed shifts: In: 240 [P.O.:240] Out: 525 [Urine:525]   Intake/Output this shift:  Total I/O In: 360 [P.O.:360] Out: 1000 [Other:1000]  Physical Exam: General: No acute distress  Head: Normocephalic, atraumatic. Moist oral mucosal membranes  Eyes: Anicteric  Neck: Supple, trachea midline  Lungs:  Clear to auscultation, normal effort  Heart: S1S2 no rubs 2/6 SEM  Abdomen:  Soft, nontender, bowel sounds present  Extremities: 2+ peripheral edema.  Neurologic: Awake, alert, following commands  Skin: No lesions  Access: Right internal jugular PermCath    Basic Metabolic Panel: Recent Labs  Lab 05/31/19 0500 06/01/19 0456 06/01/19 1030 06/02/19 0604 06/03/19 0609 06/04/19 0629 06/04/19 1128  NA 125* 130*  --  132* 137 135  --   K 5.1 4.4  --  4.2 4.2 3.9  --   CL 93* 96*  --  97* 97* 99  --   CO2 19* 20*  --  22 26 25   --   GLUCOSE 109* 96  --  95 103* 105*  --   BUN 115* 95*  --  69* 40* 45*  --   CREATININE 6.58* 5.22*  --  3.77* 2.63* 2.89*  --   CALCIUM 8.5* 8.7*  --  8.7* 9.1 9.1  --   PHOS  --   --  6.9* 5.4*  --   --  3.7    Liver Function Tests: Recent Labs  Lab 05/30/19 1252  AST 32  ALT 18  ALKPHOS 59  BILITOT 0.9  PROT 6.5  ALBUMIN 3.9   No  results for input(s): LIPASE, AMYLASE in the last 168 hours. No results for input(s): AMMONIA in the last 168 hours.  CBC: Recent Labs  Lab 05/30/19 1252 05/31/19 0500  WBC 5.1 4.3  HGB 8.7* 8.2*  HCT 26.7* 25.6*  MCV 92.7 93.1  PLT 156 150    Cardiac Enzymes: No results for input(s): CKTOTAL, CKMB, CKMBINDEX, TROPONINI in the last 168 hours.  BNP: Invalid input(s): POCBNP  CBG: No results for input(s): GLUCAP in the last 168 hours.  Microbiology: Results for orders placed or performed during the hospital encounter of 05/30/19  SARS Coronavirus 2 (CEPHEID - Performed in [place] hospital lab), Hosp Order     Status: None   Collection Time: 05/30/19  1:15 PM   Specimen: Nasopharyngeal Swab  Result Value Ref Range Status   SARS Coronavirus 2 NEGATIVE NEGATIVE Final    Comment: (NOTE) If result is NEGATIVE SARS-CoV-2 target nucleic acids are NOT DETECTED. The SARS-CoV-2 RNA is generally detectable in upper and lower  respiratory specimens during the acute phase of infection. The lowest  concentration of SARS-CoV-2 viral copies this assay can detect is 250  copies / mL. A negative result  does not preclude SARS-CoV-2 infection  and should not be used as the sole basis for treatment or other  patient management decisions.  A negative result may occur with  improper specimen collection / handling, submission of specimen other  than nasopharyngeal swab, presence of viral mutation(s) within the  areas targeted by this assay, and inadequate number of viral copies  (<250 copies / mL). A negative result must be combined with clinical  observations, patient history, and epidemiological information. If result is POSITIVE SARS-CoV-2 target nucleic acids are DETECTED. The SARS-CoV-2 RNA is generally detectable in upper and lower  respiratory specimens dur ing the acute phase of infection.  Positive  results are indicative of active infection with SARS-CoV-2.  Clinical   correlation with patient history and other diagnostic information is  necessary to determine patient infection status.  Positive results do  not rule out bacterial infection or co-infection with other viruses. If result is PRESUMPTIVE POSTIVE SARS-CoV-2 nucleic acids MAY BE PRESENT.   A presumptive positive result was obtained on the submitted specimen  and confirmed on repeat testing.  While 2019 novel coronavirus  (SARS-CoV-2) nucleic acids may be present in the submitted sample  additional confirmatory testing may be necessary for epidemiological  and / or clinical management purposes  to differentiate between  SARS-CoV-2 and other Sarbecovirus currently known to infect humans.  If clinically indicated additional testing with an alternate test  methodology [phone]) is advised. The SARS-CoV-2 RNA is generally  detectable in upper and lower respiratory sp ecimens during the acute  phase of infection. The expected result is Negative. Fact Sheet for Patients:  StrictlyIdeas.no Fact Sheet for Healthcare Providers: BankingDealers.co.za This test is not yet approved or cleared by the [place] FDA and has been authorized for detection and/or diagnosis of SARS-CoV-2 by FDA under an Emergency Use Authorization (EUA).  This EUA will remain in effect (meaning this test can be used) for the duration of the COVID-19 declaration under Section 564(b)(1) of the Act, 21 U.S.C. section 360bbb-3(b)(1), unless the authorization is terminated or revoked sooner. Performed at [facility], [address] [place], [place] [address]     Coagulation Studies: No results for input(s): LABPROT, INR in the last 72 hours.  Urinalysis: No results for input(s): COLORURINE, LABSPEC, PHURINE, GLUCOSEU, HGBUR, BILIRUBINUR, KETONESUR, PROTEINUR, UROBILINOGEN, NITRITE, LEUKOCYTESUR in the last 72 hours.  Invalid input(s): APPERANCEUR     Imaging: No results found.   Medications:   . sodium chloride     . allopurinol  100 mg Oral BID  . aspirin EC  81 mg Oral Daily  . Chlorhexidine Gluconate Cloth  6 each Topical Q0600  . colchicine  0.3 mg Oral Once per day on Mon Wed Fri  . epoetin (EPOGEN/PROCRIT) injection  10,000 Units Intravenous Q T,Th,Sa-HD  . heparin  5,000 Units Subcutaneous Q8H  . loratadine  10 mg Oral Daily  . pravastatin  20 mg Oral QHS  . sodium chloride flush  3 mL Intravenous Q12H   sodium chloride, acetaminophen, albuterol, guaiFENesin-dextromethorphan, HYDROmorphone (DILAUDID) injection, ondansetron (ZOFRAN) IV, sodium chloride flush  Assessment/ Plan:  85 y.o. female  with gout, severe peripheral vascular disease status post femoral bypass 15 years ago, several angioplasties, right leg DVT, hearing loss, hypertension, left eye macular degeneration, coronary disease with stent and angioplasty.  1.  Acute renal failure/chronic kidney disease stage IV baseline creatinine 2.4/proteinuria.  Urine output was quite low yesterday at 225 cc.  Patient unable to undergo dialysis yesterday  given high dialysis treatment volumes.  She will be undergoing dialysis today.  Orders have been prepared.  Continue ultrafiltration with dialysis.  Outpatient planning for [facility] [address]  2.  Anemia of chronic kidney disease.  Patient will likely need Epogen as an outpatient.  Continue to monitor CBC.  3.  Secondary hyperparathyroidism.  Phosphorus now down to 3.7.  Continue to monitor.   LOS: 5 Naksh Radi 7/5/20202:35 PM

## 2019-06-04 NOTE — Plan of Care (Signed)
  Problem: Education: Goal: Knowledge of disease and its progression will improve Outcome: Progressing   Problem: Health Behavior/Discharge Planning: Goal: Ability to manage health-related needs will improve Outcome: Progressing   Problem: Clinical Measurements: Goal: Complications related to the disease process or treatment will be avoided or minimized Outcome: Progressing Goal: Dialysis access will remain free of complications Outcome: Progressing   Problem: Activity: Goal: Activity intolerance will improve Outcome: Progressing   Problem: Fluid Volume: Goal: Fluid volume balance will be maintained or improved Outcome: Progressing   Problem: Nutritional: Goal: Ability to make appropriate dietary choices will improve Outcome: Progressing   Problem: Respiratory: Goal: Respiratory symptoms related to disease process will be avoided Outcome: Progressing   Problem: Self-Concept: Goal: Body image disturbance will be avoided or minimized Outcome: Progressing   Problem: Urinary Elimination: Goal: Progression of disease will be identified and treated Outcome: Progressing   Problem: Education: Goal: Ability to demonstrate management of disease process will improve Outcome: Progressing Goal: Ability to verbalize understanding of medication therapies will improve Outcome: Progressing Goal: Individualized Educational Video(s) Outcome: Progressing   Problem: Activity: Goal: Capacity to carry out activities will improve Outcome: Progressing   Problem: Cardiac: Goal: Ability to achieve and maintain adequate cardiopulmonary perfusion will improve Outcome: Progressing   Problem: Education: Goal: Knowledge of General Education information will improve Description: Including pain rating scale, medication(s)/side effects and non-pharmacologic comfort measures Outcome: Progressing   Problem: Health Behavior/Discharge Planning: Goal: Ability to manage health-related needs will  improve Outcome: Progressing   Problem: Clinical Measurements: Goal: Ability to maintain clinical measurements within normal limits will improve Outcome: Progressing Goal: Will remain free from infection Outcome: Progressing Goal: Diagnostic test results will improve Outcome: Progressing Goal: Respiratory complications will improve Outcome: Progressing Goal: Cardiovascular complication will be avoided Outcome: Progressing   Problem: Activity: Goal: Risk for activity intolerance will decrease Outcome: Progressing   Problem: Nutrition: Goal: Adequate nutrition will be maintained Outcome: Progressing   Problem: Coping: Goal: Level of anxiety will decrease Outcome: Progressing   Problem: Elimination: Goal: Will not experience complications related to bowel motility Outcome: Progressing Goal: Will not experience complications related to urinary retention Outcome: Progressing   Problem: Pain Managment: Goal: General experience of comfort will improve Outcome: Progressing   Problem: Safety: Goal: Ability to remain free from injury will improve Outcome: Progressing   Problem: Skin Integrity: Goal: Risk for impaired skin integrity will decrease Outcome: Progressing

## 2019-06-05 ENCOUNTER — Inpatient Hospital Stay: Payer: Medicare PPO

## 2019-06-05 ENCOUNTER — Encounter: Payer: Self-pay | Admitting: Vascular Surgery

## 2019-06-05 MED ORDER — COLCHICINE 0.6 MG PO TABS: 0.3000 mg | ORAL_TABLET | ORAL | 0 refills | Status: AC

## 2019-06-05 NOTE — Care Management Important Message (Signed)
Important Message  Patient Details  Name: Lori Johnson MRN: [id number] Date of Birth: Aug 08, 1930   Medicare Important Message Given:  Yes     [name] 06/05/2019, 11:28 AM

## 2019-06-05 NOTE — TOC Transition Note (Addendum)
Transition of Care [facility]) - CM/SW Discharge Note   Patient Details  Name: Lori Johnson MRN: [id number] Date of Birth: 1930-02-03  Transition of Care [facility]) CM/SW Contact:  Ross Ludwig, LCSW Phone Number: 06/05/2019, 12:45 PM   Clinical Narrative:    Patient will be discharging back home, she is a new dialysis patient, she will be going to [facility], [phone], [place], Saturday at 11:45am.  Patient has had hepatitis screen, waiting on chest x-ray results to rule out TB.  CSW faxed Hepatits, and Covid-19 test results to dialysis center, chest x-ray, to [place] at [facility], [phone].   Final next level of care: Home/Self Care Barriers to Discharge: Other (comment)(Waiting on results of chest x-ray to rule out TB.)   Patient Goals and CMS Choice   CMS Medicare.gov Compare Post Acute Care list provided to:: Patient Choice offered to / list presented to : Patient  Discharge Placement                       Discharge Plan and Services                DME Arranged: N/A DME Agency: NA       HH Arranged: NA HH Agency: NA        Social Determinants of Health (SDOH) Interventions     Readmission Risk Interventions No flowsheet data found.

## 2019-06-05 NOTE — Discharge Summary (Signed)
[place] [facility] [place] NAME: Lori Johnson    MR#:  [id number]  DATE OF BIRTH:  1930/03/17  DATE OF ADMISSION:  05/30/2019   ADMITTING PHYSICIAN: Lori Lighter, MD  DATE OF DISCHARGE: 06/05/19  PRIMARY CARE PHYSICIAN: Chrismon, Lori Muff, PA   ADMISSION DIAGNOSIS:  acute renal failure, SOB, edema DISCHARGE DIAGNOSIS:  Active Problems:   Acute CHF (congestive heart failure) ([place])  SECONDARY DIAGNOSIS:   Past Medical History:  Diagnosis Date  . Angiopathy, peripheral ([place])   . Arterial embolus and thrombosis ([place])   . ASCVD (arteriosclerotic cardiovascular disease)    with stent RCA  . Bilateral bunions   . Cancer ([place])    SCC removed off left leg  . Cervical lymphadenitis   . Chronic gout of left ankle due to renal impairment without tophus   . Chronic infection June 2014   Has chronic infection of right femoral graft with history of extensive debridement in June 2014. Was on oral Clindamycin for 3 weeks for culture positive for anerobes and staph lugdunensis. Vascular Surgeon at [facility] Lori Richter, MD) recommended indefinite suppressive therapy with Cephalexin 500 mg qd and reassessment every 3 months. Will refill Cephalexin.)  . Chronic kidney disease   . COPD (chronic obstructive pulmonary disease) ([place])   . CRF (chronic renal failure)   . GERD (gastroesophageal reflux disease)   . Hiatal hernia   . History of arterial bypass of lower extremity   . Hyperlipidemia   . Hypertension   . Hyperuricemia   . Macular degeneration   . Mass of right side of neck   . Nocturia   . OA (osteoarthritis)   . PVD (peripheral vascular disease) ([place])   . Renal disorder    HOSPITAL COURSE:   [name] is an 85 year old female who presented to the ED with shortness of breath and lower extremity edema.  She had poor urine output with IV Lasix, so the decision was made to start her on hemodialysis.  She had a permacath placed by vascular surgery on  7/2.  She started hemodialysis during her hospitalization, and tolerated this well.  She was discharged with plan to continue outpatient hemodialysis on Tuesdays, Thursdays, and Saturdays.  Patient was noted to have soft BPs after initiation of hemodialysis.  Her home Norvasc, losartan, and HCTZ were held on discharge, but can be restarted as needed as an outpatient.  DISCHARGE CONDITIONS:  ESRD on hemodialysis Hypertension Hyperlipidemia Moderate to severe aortic stenosis CONSULTS OBTAINED:  Treatment Team:  Lori Cowman, MD DRUG ALLERGIES:   Allergies  Allergen Reactions  . Pletal [Cilostazol] Swelling  . Erythromycin Hives  . Codeine Nausea Only and Nausea And Vomiting  . Erythromycin Base Itching and Rash  . Omnicef [Cefdinir] Itching and Rash  . Penicillins Hives, Itching and Rash  . Sulfa Antibiotics Rash   DISCHARGE MEDICATIONS:   Allergies as of 06/05/2019      Reactions   Pletal [cilostazol] Swelling   Erythromycin Hives   Codeine Nausea Only, Nausea And Vomiting   Erythromycin Base Itching, Rash   Omnicef [cefdinir] Itching, Rash   Penicillins Hives, Itching, Rash   Sulfa Antibiotics Rash      Medication List    STOP taking these medications   amLODipine 10 MG tablet Commonly known as: NORVASC   cephALEXin 500 MG capsule Commonly known as: KEFLEX   furosemide 40 MG tablet Commonly known as: LASIX   losartan-hydrochlorothiazide 50-12.5 MG tablet Commonly known as:  HYZAAR     TAKE these medications   albuterol 108 (90 Base) MCG/ACT inhaler Commonly known as: VENTOLIN HFA TAKE 2 PUFFS BY MOUTH EVERY 6 HOURS AS NEEDED FOR WHEEZE OR SHORTNESS OF BREATH   allopurinol 100 MG tablet Commonly known as: ZYLOPRIM Take 100 mg by mouth 2 (two) times daily.   aspirin EC 81 MG tablet Take 81 mg by mouth daily.   CLARITIN PO Take 10 mg by mouth daily.   colchicine 0.6 MG tablet Take 0.5 tablets (0.3 mg total) by mouth 3 (three) times a week. Start  taking on: June 07, 2019 What changed:   how much to take  when to take this  reasons to take this   hydrocortisone valerate cream 0.2 % Commonly known as: WESTCORT Apply 1 application topically as needed.   ICAPS AREDS 2 PO Take 1 tablet by mouth daily.   OXYGEN Inhale 2 L into the lungs.   pantoprazole 20 MG tablet Commonly known as: PROTONIX Take 2 caps. Daily in am. What changed: how to take this   pravastatin 20 MG tablet Commonly known as: PRAVACHOL TAKE 1 TABLET AT BEDTIME   TYLENOL ARTHRITIS PAIN PO Take by mouth as needed.   vitamin B-12 100 MCG tablet Commonly known as: CYANOCOBALAMIN Take 100 mcg by mouth daily.        DISCHARGE INSTRUCTIONS:  1.  Follow-up with PCP in 5 days 2.  Start dialysis tomorrow and continue on TTS schedule 3.  Norvasc, losartan, and HCTZ were held on discharge due to soft BPs after initiation of dialysis.  May be restarted as an outpatient as needed. DIET:  Cardiac and renal diet DISCHARGE CONDITION:  Stable ACTIVITY:  Activity as tolerated OXYGEN:  Home Oxygen: No.  Oxygen Delivery: room air DISCHARGE LOCATION:  home   If you experience worsening of your admission symptoms, develop shortness of breath, life threatening emergency, suicidal or homicidal thoughts you must seek medical attention immediately by calling 911 or calling your MD immediately  if symptoms less severe.  You Must read complete instructions/literature along with all the possible adverse reactions/side effects for all the Medicines you take and that have been prescribed to you. Take any new Medicines after you have completely understood and accpet all the possible adverse reactions/side effects.   Please note  You were cared for by a hospitalist during your hospital stay. If you have any questions about your discharge medications or the care you received while you were in the hospital after you are discharged, you can call the unit and asked to speak  with the hospitalist on call if the hospitalist that took care of you is not available. Once you are discharged, your primary care physician will handle any further medical issues. Please note that NO REFILLS for any discharge medications will be authorized once you are discharged, as it is imperative that you return to your primary care physician (or establish a relationship with a primary care physician if you do not have one) for your aftercare needs so that they can reassess your need for medications and monitor your lab values.    On the day of Discharge:  VITAL SIGNS:  Blood pressure (!) 106/56, pulse 61, temperature 97.6 F (36.4 C), temperature source Oral, resp. rate (!) 22, height 5\' 3"  (1.6 m), weight 84.4 kg, SpO2 96 %. PHYSICAL EXAMINATION:  GENERAL:  85 y.o.-year-old patient lying in the bed with no acute distress.  EYES: Pupils equal, round, reactive to light  and accommodation. No scleral icterus. Extraocular muscles intact.  HEENT: Head atraumatic, normocephalic. Oropharynx and nasopharynx clear.  NECK:  Supple, no jugular venous distention. No thyroid enlargement, no tenderness.  LUNGS: Normal breath sounds bilaterally, no wheezing, rales,rhonchi or crepitation. No use of accessory muscles of respiration.  Nasal cannula in place. CARDIOVASCULAR: S1, S2 normal. No murmurs, rubs, or gallops. + Right IJ PermCath in place ABDOMEN: Soft, non-tender, non-distended. Bowel sounds present. No organomegaly or mass.  EXTREMITIES: No pedal edema, cyanosis, or clubbing.  NEUROLOGIC: Cranial nerves II through XII are intact. +global weakness. Sensation intact. Gait not checked.  PSYCHIATRIC: The patient is alert and oriented x 3.  SKIN: No obvious rash, lesion, or ulcer.  DATA REVIEW:   CBC Recent Labs  Lab 05/31/19 0500  WBC 4.3  HGB 8.2*  HCT 25.6*  PLT 150    Chemistries  Recent Labs  Lab 05/30/19 1252  06/04/19 0629  NA 125*   < > 135  K 5.1   < > 3.9  CL 91*   < > 99   CO2 17*   < > 25  GLUCOSE 124*   < > 105*  BUN 113*   < > 45*  CREATININE 6.16*  6.20*   < > 2.89*  CALCIUM 9.0   < > 9.1  AST 32  --   --   ALT 18  --   --   ALKPHOS 59  --   --   BILITOT 0.9  --   --    < > = values in this interval not displayed.     Microbiology Results  Results for orders placed or performed during the hospital encounter of 05/30/19  SARS Coronavirus 2 (CEPHEID - Performed in [place] hospital lab), Hosp Order     Status: None   Collection Time: 05/30/19  1:15 PM   Specimen: Nasopharyngeal Swab  Result Value Ref Range Status   SARS Coronavirus 2 NEGATIVE NEGATIVE Final    Comment: (NOTE) If result is NEGATIVE SARS-CoV-2 target nucleic acids are NOT DETECTED. The SARS-CoV-2 RNA is generally detectable in upper and lower  respiratory specimens during the acute phase of infection. The lowest  concentration of SARS-CoV-2 viral copies this assay can detect is 250  copies / mL. A negative result does not preclude SARS-CoV-2 infection  and should not be used as the sole basis for treatment or other  patient management decisions.  A negative result may occur with  improper specimen collection / handling, submission of specimen other  than nasopharyngeal swab, presence of viral mutation(s) within the  areas targeted by this assay, and inadequate number of viral copies  (<250 copies / mL). A negative result must be combined with clinical  observations, patient history, and epidemiological information. If result is POSITIVE SARS-CoV-2 target nucleic acids are DETECTED. The SARS-CoV-2 RNA is generally detectable in upper and lower  respiratory specimens dur ing the acute phase of infection.  Positive  results are indicative of active infection with SARS-CoV-2.  Clinical  correlation with patient history and other diagnostic information is  necessary to determine patient infection status.  Positive results do  not rule out bacterial infection or co-infection  with other viruses. If result is PRESUMPTIVE POSTIVE SARS-CoV-2 nucleic acids MAY BE PRESENT.   A presumptive positive result was obtained on the submitted specimen  and confirmed on repeat testing.  While 2019 novel coronavirus  (SARS-CoV-2) nucleic acids may be present in the submitted sample  additional  confirmatory testing may be necessary for epidemiological  and / or clinical management purposes  to differentiate between  SARS-CoV-2 and other Sarbecovirus currently known to infect humans.  If clinically indicated additional testing with an alternate test  methodology [id number]) is advised. The SARS-CoV-2 RNA is generally  detectable in upper and lower respiratory sp ecimens during the acute  phase of infection. The expected result is Negative. Fact Sheet for Patients:  StrictlyIdeas.no Fact Sheet for Healthcare Providers: BankingDealers.co.za This test is not yet approved or cleared by the [place] FDA and has been authorized for detection and/or diagnosis of SARS-CoV-2 by FDA under an Emergency Use Authorization (EUA).  This EUA will remain in effect (meaning this test can be used) for the duration of the COVID-19 declaration under Section 564(b)(1) of the Act, 21 U.S.C. section 360bbb-3(b)(1), unless the authorization is terminated or revoked sooner. Performed at [facility], [address] [place], [place] [address]     RADIOLOGY:  No results found.   Management plans discussed with the patient, family and they are in agreement.  CODE STATUS: Partial Code   TOTAL TIME TAKING CARE OF THIS PATIENT: 45 minutes.    Berna Spare Mayo M.D on 06/05/2019 at 12:16 PM  Between 7am to 6pm - Pager - [phone]  After 6pm go to [email or web address] - Proofreader  [facility]  Office  [phone]  CC: Primary care physician; Margo Common, PA   Note: This dictation was prepared  with Dragon dictation along with smaller phrase technology. Any transcriptional errors that result from this process are unintentional.

## 2019-06-05 NOTE — Progress Notes (Signed)
[facility]  ROUNDING NOTE   Subjective:   Daughter at bedside. Patient received hemodialysis treatment yesterday. Tolerated treatment well.   Patient seated in chair.   Objective:  Vital signs in last 24 hours:  Temp:  [97.6 F (36.4 C)-98.7 F (37.1 C)] 97.6 F (36.4 C) (07/06 0738) Pulse Rate:  [35-106] 61 (07/06 0738) Resp:  [16-22] 22 (07/06 0950) BP: (98-129)/(45-69) 106/56 (07/06 0738) SpO2:  [93 %-100 %] 96 % (07/06 1000)  Weight change:  Filed Weights   06/02/19 1704 06/03/19 0442 06/04/19 0333  Weight: 85.2 kg 84.6 kg 84.4 kg    Intake/Output: I/O last 3 completed shifts: In: 360 [P.O.:360] Out: 1075 [Urine:75; Other:1000]   Intake/Output this shift:  Total I/O In: 240 [P.O.:240] Out: -   Physical Exam: General: Seated in chair  Head: Normocephalic, atraumatic. Moist oral mucosal membranes  Eyes: Anicteric  Neck: Supple, trachea midline  Lungs:  Clear to auscultation, normal effort  Heart: Regular 2/6 SEM  Abdomen:  Soft, nontender, bowel sounds present  Extremities: 2+ peripheral edema.  Neurologic: Awake, alert, following commands  Skin: No lesions  Access: Right internal jugular PermCath    Basic Metabolic Panel: Recent Labs  Lab 05/31/19 0500 06/01/19 0456 06/01/19 1030 06/02/19 0604 06/03/19 0609 06/04/19 0629 06/04/19 1128  NA 125* 130*  --  132* 137 135  --   K 5.1 4.4  --  4.2 4.2 3.9  --   CL 93* 96*  --  97* 97* 99  --   CO2 19* 20*  --  22 26 25   --   GLUCOSE 109* 96  --  95 103* 105*  --   BUN 115* 95*  --  69* 40* 45*  --   CREATININE 6.58* 5.22*  --  3.77* 2.63* 2.89*  --   CALCIUM 8.5* 8.7*  --  8.7* 9.1 9.1  --   PHOS  --   --  6.9* 5.4*  --   --  3.7    Liver Function Tests: Recent Labs  Lab 05/30/19 1252  AST 32  ALT 18  ALKPHOS 59  BILITOT 0.9  PROT 6.5  ALBUMIN 3.9   No results for input(s): LIPASE, AMYLASE in the last 168 hours. No results for input(s): AMMONIA in the last 168  hours.  CBC: Recent Labs  Lab 05/30/19 1252 05/31/19 0500  WBC 5.1 4.3  HGB 8.7* 8.2*  HCT 26.7* 25.6*  MCV 92.7 93.1  PLT 156 150    Cardiac Enzymes: No results for input(s): CKTOTAL, CKMB, CKMBINDEX, TROPONINI in the last 168 hours.  BNP: Invalid input(s): POCBNP  CBG: No results for input(s): GLUCAP in the last 168 hours.  Microbiology: Results for orders placed or performed during the hospital encounter of 05/30/19  SARS Coronavirus 2 (CEPHEID - Performed in [facility] hospital lab), Hosp Order     Status: None   Collection Time: 05/30/19  1:15 PM   Specimen: Nasopharyngeal Swab  Result Value Ref Range Status   SARS Coronavirus 2 NEGATIVE NEGATIVE Final    Comment: (NOTE) If result is NEGATIVE SARS-CoV-2 target nucleic acids are NOT DETECTED. The SARS-CoV-2 RNA is generally detectable in upper and lower  respiratory specimens during the acute phase of infection. The lowest  concentration of SARS-CoV-2 viral copies this assay can detect is 250  copies / mL. A negative result does not preclude SARS-CoV-2 infection  and should not be used as the sole basis for treatment or other  patient  management decisions.  A negative result may occur with  improper specimen collection / handling, submission of specimen other  than nasopharyngeal swab, presence of viral mutation(s) within the  areas targeted by this assay, and inadequate number of viral copies  (<250 copies / mL). A negative result must be combined with clinical  observations, patient history, and epidemiological information. If result is POSITIVE SARS-CoV-2 target nucleic acids are DETECTED. The SARS-CoV-2 RNA is generally detectable in upper and lower  respiratory specimens dur ing the acute phase of infection.  Positive  results are indicative of active infection with SARS-CoV-2.  Clinical  correlation with patient history and other diagnostic information is  necessary to determine patient infection status.   Positive results do  not rule out bacterial infection or co-infection with other viruses. If result is PRESUMPTIVE POSTIVE SARS-CoV-2 nucleic acids MAY BE PRESENT.   A presumptive positive result was obtained on the submitted specimen  and confirmed on repeat testing.  While 2019 novel coronavirus  (SARS-CoV-2) nucleic acids may be present in the submitted sample  additional confirmatory testing may be necessary for epidemiological  and / or clinical management purposes  to differentiate between  SARS-CoV-2 and other Sarbecovirus currently known to infect humans.  If clinically indicated additional testing with an alternate test  methodology [phone]) is advised. The SARS-CoV-2 RNA is generally  detectable in upper and lower respiratory sp ecimens during the acute  phase of infection. The expected result is Negative. Fact Sheet for Patients:  StrictlyIdeas.no Fact Sheet for Healthcare Providers: BankingDealers.co.za This test is not yet approved or cleared by the [place] FDA and has been authorized for detection and/or diagnosis of SARS-CoV-2 by FDA under an Emergency Use Authorization (EUA).  This EUA will remain in effect (meaning this test can be used) for the duration of the COVID-19 declaration under Section 564(b)(1) of the Act, 21 U.S.C. section 360bbb-3(b)(1), unless the authorization is terminated or revoked sooner. Performed at [facility], [address] [place], [place] [address]     Coagulation Studies: No results for input(s): LABPROT, INR in the last 72 hours.  Urinalysis: No results for input(s): COLORURINE, LABSPEC, PHURINE, GLUCOSEU, HGBUR, BILIRUBINUR, KETONESUR, PROTEINUR, UROBILINOGEN, NITRITE, LEUKOCYTESUR in the last 72 hours.  Invalid input(s): APPERANCEUR    Imaging: No results found.   Medications:   . sodium chloride     . allopurinol  100 mg Oral BID  . aspirin EC  81 mg Oral  Daily  . Chlorhexidine Gluconate Cloth  6 each Topical Q0600  . colchicine  0.3 mg Oral Once per day on Mon Wed Fri  . epoetin (EPOGEN/PROCRIT) injection  10,000 Units Intravenous Q T,Th,Sa-HD  . heparin  5,000 Units Subcutaneous Q8H  . loratadine  10 mg Oral Daily  . pravastatin  20 mg Oral QHS  . sodium chloride flush  3 mL Intravenous Q12H   sodium chloride, acetaminophen, albuterol, ALPRAZolam, guaiFENesin-dextromethorphan, HYDROmorphone (DILAUDID) injection, ondansetron (ZOFRAN) IV, sodium chloride flush  Assessment/ Plan:  85 y.o. female with gout, severe peripheral vascular disease status post femoral bypass 15 years ago, several angioplasties, right leg DVT, hearing loss, hypertension, left eye macular degeneration, coronary disease with stent and angioplasty.  1. Acute renal failure on chronic kidney disease stage IV: baseline creatinine of 1.84, GFR of 24 on 02/16/2019 Next hemodialysis treatment for tomorrow. TTS schedule.  Outpatient plan for [facility].   2. Hypertension: 106/56. Holding blood pressure medications.   3.  Anemia of chronic  kidney disease: hemoglobin 8.2 normocytic - EPO with HD treatment.   4.  Secondary hyperparathyroidism: currently not on binders.    LOS: 6 [place] 7/6/202012:56 PM

## 2019-06-05 NOTE — Discharge Instructions (Signed)
It was so nice to meet you during this hospitalization!  You came into the hospital with shortness of breath and leg swelling. You were started on dialysis.  I have STOPPED your Norvasc and Lisinopril-hydrochlorothiazide because your blood pressures have been on the lower side since you started dialysis. These may need to be restarted in the future.  Please make sure you go to [facility] on [address] at [employer] tomorrow (Tuesday).  Take care, Dr. Brett Albino

## 2019-06-05 NOTE — Progress Notes (Signed)
Daughter is frustrated and annoyed that the plan of care changes with each physician who comes into the room, the they don't know what the plan is.  Discharged to home with her daughter.  Will have outpatient dialysis at [facility] on [address].

## 2019-06-05 NOTE — TOC Transition Note (Signed)
Transition of Care [facility]) - CM/SW Discharge Note   Patient Details  Name: Lori Johnson MRN: [id number] Date of Birth: 1930/10/14  Transition of Care [facility] CM/SW Contact:  Ross Ludwig, LCSW Phone Number: 06/05/2019, 5:33 PM   Clinical Narrative:     Patient was waiting for results of the chest x-ray to be sent to the [facility], results received.  Patient discharging back home, no home health needs.  Patient will be starting dialysis tomorrow at [facility], she will be receiving T, TH, Sa, at 11:45am.   Final next level of care: Home/Self Care Barriers to Discharge: No Barriers Identified(Waiting on results of chest x-ray to rule out TB.)   Patient Goals and CMS Choice   CMS Medicare.gov Compare Post Acute Care list provided to:: Patient Choice offered to / list presented to : Patient  Discharge Placement  Patient discharging back home.                     Discharge Plan and Services                DME Arranged: N/A DME Agency: NA       HH Arranged: NA HH Agency: NA        Social Determinants of Health (SDOH) Interventions     Readmission Risk Interventions No flowsheet data found.

## 2019-06-06 ENCOUNTER — Telehealth: Payer: Self-pay

## 2019-06-06 DIAGNOSIS — N179 Acute kidney failure, unspecified: Secondary | ICD-10-CM | POA: Diagnosis not present

## 2019-06-06 DIAGNOSIS — N189 Chronic kidney disease, unspecified: Secondary | ICD-10-CM | POA: Diagnosis not present

## 2019-06-06 DIAGNOSIS — N183 Chronic kidney disease, stage 3 (moderate): Secondary | ICD-10-CM | POA: Diagnosis not present

## 2019-06-06 NOTE — Telephone Encounter (Signed)
Transition Care Management Follow-up Telephone Call  Date of discharge and from where: [facility] on 06/05/19  How have you been since you were released from the hospital? Still has swelling in legs and feet but was advised that is the last place dialysis affects. Has had some SOB with movement only and cough has decreased. Occasionally has clear to yellowish sputum. Pt is tired and weak. Declines pain, fever or n/v/d.  Any questions or concerns? No   Items Reviewed:  Did the pt receive and understand the discharge instructions provided? Yes   Medications obtained and verified? Declined reviewing meds at this time. Will review at [facility].   Any new allergies since your discharge? No   Dietary orders reviewed? Yes  Do you have support at home? Yes   Other (ie: DME, Home Health, etc) N/A  Functional Questionnaire: (I = Independent and D = Dependent)  Bathing/Dressing- Has assistance currently.    Meal Prep- D   Eating- I  Maintaining continence- I  Transferring/Ambulation- Uses a walker.  Managing Meds- D   Follow up appointments reviewed:    PCP Hospital f/u appt confirmed? Yes  Scheduled to see [name] on 06/09/19 @ 9:00 AM.  [facility] f/u appt confirmed? Yes    Are transportation arrangements needed? No   If their condition worsens, is the pt aware to call  their PCP or go to the ED? Yes  Was the patient provided with contact information for the PCP's office or ED? Yes  Was the pt encouraged to call back with questions or concerns? Yes

## 2019-06-06 NOTE — Telephone Encounter (Signed)
HFU scheduled 06/09/19.

## 2019-06-08 DIAGNOSIS — N179 Acute kidney failure, unspecified: Secondary | ICD-10-CM | POA: Diagnosis not present

## 2019-06-08 DIAGNOSIS — N189 Chronic kidney disease, unspecified: Secondary | ICD-10-CM | POA: Diagnosis not present

## 2019-06-08 DIAGNOSIS — N183 Chronic kidney disease, stage 3 (moderate): Secondary | ICD-10-CM | POA: Diagnosis not present

## 2019-06-09 ENCOUNTER — Telehealth: Payer: Self-pay | Admitting: Family Medicine

## 2019-06-09 ENCOUNTER — Inpatient Hospital Stay: Payer: Self-pay | Admitting: Family Medicine

## 2019-06-09 NOTE — Telephone Encounter (Signed)
Please review. Thanks!  

## 2019-06-09 NOTE — Telephone Encounter (Signed)
May reschedule follow up appointment in 1-2 weeks.

## 2019-06-09 NOTE — Telephone Encounter (Signed)
Pt's Daughter called to cancel today's hospital F/U visit.  Pt had extensive dialysis yesterday and cannot make today's appt.  Please call [name] back if she needs to be seen next week.  Thanks, [employer]

## 2019-06-09 NOTE — Telephone Encounter (Signed)
Left message to call back to reschedule appt.

## 2019-06-10 DIAGNOSIS — N183 Chronic kidney disease, stage 3 (moderate): Secondary | ICD-10-CM | POA: Diagnosis not present

## 2019-06-10 DIAGNOSIS — N179 Acute kidney failure, unspecified: Secondary | ICD-10-CM | POA: Diagnosis not present

## 2019-06-10 DIAGNOSIS — N189 Chronic kidney disease, unspecified: Secondary | ICD-10-CM | POA: Diagnosis not present

## 2019-06-13 DIAGNOSIS — N183 Chronic kidney disease, stage 3 (moderate): Secondary | ICD-10-CM | POA: Diagnosis not present

## 2019-06-13 DIAGNOSIS — N179 Acute kidney failure, unspecified: Secondary | ICD-10-CM | POA: Diagnosis not present

## 2019-06-13 DIAGNOSIS — N189 Chronic kidney disease, unspecified: Secondary | ICD-10-CM | POA: Diagnosis not present

## 2019-06-13 NOTE — Telephone Encounter (Signed)
[facility]MT[facility] ED 

## 2019-06-15 DIAGNOSIS — N183 Chronic kidney disease, stage 3 (moderate): Secondary | ICD-10-CM | POA: Diagnosis not present

## 2019-06-15 DIAGNOSIS — N189 Chronic kidney disease, unspecified: Secondary | ICD-10-CM | POA: Diagnosis not present

## 2019-06-15 DIAGNOSIS — N179 Acute kidney failure, unspecified: Secondary | ICD-10-CM | POA: Diagnosis not present

## 2019-06-16 ENCOUNTER — Encounter: Payer: Self-pay | Admitting: Family Medicine

## 2019-06-16 DIAGNOSIS — J449 Chronic obstructive pulmonary disease, unspecified: Secondary | ICD-10-CM | POA: Diagnosis not present

## 2019-06-17 DIAGNOSIS — N183 Chronic kidney disease, stage 3 (moderate): Secondary | ICD-10-CM | POA: Diagnosis not present

## 2019-06-17 DIAGNOSIS — N179 Acute kidney failure, unspecified: Secondary | ICD-10-CM | POA: Diagnosis not present

## 2019-06-17 DIAGNOSIS — N189 Chronic kidney disease, unspecified: Secondary | ICD-10-CM | POA: Diagnosis not present

## 2019-06-20 DIAGNOSIS — N183 Chronic kidney disease, stage 3 (moderate): Secondary | ICD-10-CM | POA: Diagnosis not present

## 2019-06-20 DIAGNOSIS — N179 Acute kidney failure, unspecified: Secondary | ICD-10-CM | POA: Diagnosis not present

## 2019-06-20 DIAGNOSIS — N189 Chronic kidney disease, unspecified: Secondary | ICD-10-CM | POA: Diagnosis not present

## 2019-06-21 ENCOUNTER — Ambulatory Visit: Payer: Self-pay | Admitting: Adult Health

## 2019-06-22 DIAGNOSIS — N183 Chronic kidney disease, stage 3 (moderate): Secondary | ICD-10-CM | POA: Diagnosis not present

## 2019-06-22 DIAGNOSIS — N189 Chronic kidney disease, unspecified: Secondary | ICD-10-CM | POA: Diagnosis not present

## 2019-06-22 DIAGNOSIS — N179 Acute kidney failure, unspecified: Secondary | ICD-10-CM | POA: Diagnosis not present

## 2019-06-23 NOTE — Progress Notes (Deleted)
   Patient ID: Lori Johnson, female    DOB: 10/19/1930, 85 y.o.   MRN: [id number]  HPI  Ms Gene is a 85 y/o female with a history of  Echo report from 05/09/2019 reviewed and showed an EF of 50-55% along with trivial AR and moderate/severe AS.   Admitted 05/30/2019 due to acute HF. Cardiology and vascular consults were obtained. Initially given IV lasix and then needed dialysis. Permacath was placed. HTN medications were held due to hypotension. Discharged after 6 days.   She presents today for a follow-up visit with a chief complaint of  Review of Systems    Physical Exam    Assessment & Plan:  1. Chronic heart failure with preserved ejection fraction- - NYHA class III - euvolemic per patient's description of symptoms - weighing daily and says that her weight has been stable; reminded to call for an overnight weight gain of >2 pounds or a weekly weight gain of >5 pounds - not adding salt and her daughter says that she's been reading food labels for sodium content - saw cardiology Clayborn Bigness) 05/14/18 - saw pulmonology Humphrey Rolls) 05/18/2019 - BNP 05/08/2019 was 1949.0  2: HTN- - BP - saw PCP (Chrismon) 05/16/2019 - BMP from 06/04/2019 reviewed and showed sodium 135, potassium 3.9, creatinine 2.89 and GFR 14

## 2019-06-24 DIAGNOSIS — N179 Acute kidney failure, unspecified: Secondary | ICD-10-CM | POA: Diagnosis not present

## 2019-06-24 DIAGNOSIS — N189 Chronic kidney disease, unspecified: Secondary | ICD-10-CM | POA: Diagnosis not present

## 2019-06-24 DIAGNOSIS — N183 Chronic kidney disease, stage 3 (moderate): Secondary | ICD-10-CM | POA: Diagnosis not present

## 2019-06-26 ENCOUNTER — Ambulatory Visit: Payer: Medicare PPO | Admitting: Family

## 2019-06-27 DIAGNOSIS — Z992 Dependence on renal dialysis: Secondary | ICD-10-CM | POA: Diagnosis not present

## 2019-06-27 DIAGNOSIS — N179 Acute kidney failure, unspecified: Secondary | ICD-10-CM | POA: Diagnosis not present

## 2019-06-27 DIAGNOSIS — N183 Chronic kidney disease, stage 3 (moderate): Secondary | ICD-10-CM | POA: Diagnosis not present

## 2019-06-27 DIAGNOSIS — N189 Chronic kidney disease, unspecified: Secondary | ICD-10-CM | POA: Diagnosis not present

## 2019-06-29 DIAGNOSIS — N183 Chronic kidney disease, stage 3 (moderate): Secondary | ICD-10-CM | POA: Diagnosis not present

## 2019-06-29 DIAGNOSIS — N179 Acute kidney failure, unspecified: Secondary | ICD-10-CM | POA: Diagnosis not present

## 2019-06-29 DIAGNOSIS — N189 Chronic kidney disease, unspecified: Secondary | ICD-10-CM | POA: Diagnosis not present

## 2019-07-03 DIAGNOSIS — N184 Chronic kidney disease, stage 4 (severe): Secondary | ICD-10-CM | POA: Diagnosis not present

## 2019-07-03 DIAGNOSIS — N2581 Secondary hyperparathyroidism of renal origin: Secondary | ICD-10-CM | POA: Diagnosis not present

## 2019-07-03 DIAGNOSIS — R609 Edema, unspecified: Secondary | ICD-10-CM | POA: Diagnosis not present

## 2019-07-03 DIAGNOSIS — I129 Hypertensive chronic kidney disease with stage 1 through stage 4 chronic kidney disease, or unspecified chronic kidney disease: Secondary | ICD-10-CM | POA: Diagnosis not present

## 2019-07-03 DIAGNOSIS — M109 Gout, unspecified: Secondary | ICD-10-CM | POA: Diagnosis not present

## 2019-07-05 DIAGNOSIS — R600 Localized edema: Secondary | ICD-10-CM | POA: Diagnosis not present

## 2019-07-05 DIAGNOSIS — N179 Acute kidney failure, unspecified: Secondary | ICD-10-CM | POA: Diagnosis not present

## 2019-07-05 DIAGNOSIS — N184 Chronic kidney disease, stage 4 (severe): Secondary | ICD-10-CM | POA: Diagnosis not present

## 2019-07-05 DIAGNOSIS — I129 Hypertensive chronic kidney disease with stage 1 through stage 4 chronic kidney disease, or unspecified chronic kidney disease: Secondary | ICD-10-CM | POA: Diagnosis not present

## 2019-07-10 DIAGNOSIS — N2581 Secondary hyperparathyroidism of renal origin: Secondary | ICD-10-CM | POA: Diagnosis not present

## 2019-07-10 DIAGNOSIS — R609 Edema, unspecified: Secondary | ICD-10-CM | POA: Diagnosis not present

## 2019-07-10 DIAGNOSIS — M109 Gout, unspecified: Secondary | ICD-10-CM | POA: Diagnosis not present

## 2019-07-10 DIAGNOSIS — N179 Acute kidney failure, unspecified: Secondary | ICD-10-CM | POA: Diagnosis not present

## 2019-07-10 DIAGNOSIS — I129 Hypertensive chronic kidney disease with stage 1 through stage 4 chronic kidney disease, or unspecified chronic kidney disease: Secondary | ICD-10-CM | POA: Diagnosis not present

## 2019-07-10 DIAGNOSIS — N184 Chronic kidney disease, stage 4 (severe): Secondary | ICD-10-CM | POA: Diagnosis not present

## 2019-07-12 DIAGNOSIS — N179 Acute kidney failure, unspecified: Secondary | ICD-10-CM | POA: Diagnosis not present

## 2019-07-12 DIAGNOSIS — R600 Localized edema: Secondary | ICD-10-CM | POA: Diagnosis not present

## 2019-07-12 DIAGNOSIS — N184 Chronic kidney disease, stage 4 (severe): Secondary | ICD-10-CM | POA: Diagnosis not present

## 2019-07-12 DIAGNOSIS — I129 Hypertensive chronic kidney disease with stage 1 through stage 4 chronic kidney disease, or unspecified chronic kidney disease: Secondary | ICD-10-CM | POA: Diagnosis not present

## 2019-07-17 DIAGNOSIS — N2581 Secondary hyperparathyroidism of renal origin: Secondary | ICD-10-CM | POA: Diagnosis not present

## 2019-07-17 DIAGNOSIS — N184 Chronic kidney disease, stage 4 (severe): Secondary | ICD-10-CM | POA: Diagnosis not present

## 2019-07-17 DIAGNOSIS — M109 Gout, unspecified: Secondary | ICD-10-CM | POA: Diagnosis not present

## 2019-07-17 DIAGNOSIS — R609 Edema, unspecified: Secondary | ICD-10-CM | POA: Diagnosis not present

## 2019-07-17 DIAGNOSIS — J449 Chronic obstructive pulmonary disease, unspecified: Secondary | ICD-10-CM | POA: Diagnosis not present

## 2019-07-17 DIAGNOSIS — N179 Acute kidney failure, unspecified: Secondary | ICD-10-CM | POA: Diagnosis not present

## 2019-07-17 DIAGNOSIS — I129 Hypertensive chronic kidney disease with stage 1 through stage 4 chronic kidney disease, or unspecified chronic kidney disease: Secondary | ICD-10-CM | POA: Diagnosis not present

## 2019-07-19 DIAGNOSIS — N184 Chronic kidney disease, stage 4 (severe): Secondary | ICD-10-CM | POA: Diagnosis not present

## 2019-07-19 DIAGNOSIS — I129 Hypertensive chronic kidney disease with stage 1 through stage 4 chronic kidney disease, or unspecified chronic kidney disease: Secondary | ICD-10-CM | POA: Diagnosis not present

## 2019-07-19 DIAGNOSIS — N2581 Secondary hyperparathyroidism of renal origin: Secondary | ICD-10-CM | POA: Diagnosis not present

## 2019-07-19 DIAGNOSIS — E8770 Fluid overload, unspecified: Secondary | ICD-10-CM | POA: Diagnosis not present

## 2019-07-19 DIAGNOSIS — N179 Acute kidney failure, unspecified: Secondary | ICD-10-CM | POA: Diagnosis not present

## 2019-07-24 DIAGNOSIS — R609 Edema, unspecified: Secondary | ICD-10-CM | POA: Diagnosis not present

## 2019-07-24 DIAGNOSIS — I129 Hypertensive chronic kidney disease with stage 1 through stage 4 chronic kidney disease, or unspecified chronic kidney disease: Secondary | ICD-10-CM | POA: Diagnosis not present

## 2019-07-24 DIAGNOSIS — M109 Gout, unspecified: Secondary | ICD-10-CM | POA: Diagnosis not present

## 2019-07-24 DIAGNOSIS — N184 Chronic kidney disease, stage 4 (severe): Secondary | ICD-10-CM | POA: Diagnosis not present

## 2019-07-24 DIAGNOSIS — N2581 Secondary hyperparathyroidism of renal origin: Secondary | ICD-10-CM | POA: Diagnosis not present

## 2019-07-24 DIAGNOSIS — N179 Acute kidney failure, unspecified: Secondary | ICD-10-CM | POA: Diagnosis not present

## 2019-07-26 DIAGNOSIS — N184 Chronic kidney disease, stage 4 (severe): Secondary | ICD-10-CM | POA: Diagnosis not present

## 2019-07-26 DIAGNOSIS — N2581 Secondary hyperparathyroidism of renal origin: Secondary | ICD-10-CM | POA: Diagnosis not present

## 2019-07-26 DIAGNOSIS — I129 Hypertensive chronic kidney disease with stage 1 through stage 4 chronic kidney disease, or unspecified chronic kidney disease: Secondary | ICD-10-CM | POA: Diagnosis not present

## 2019-07-26 DIAGNOSIS — R609 Edema, unspecified: Secondary | ICD-10-CM | POA: Diagnosis not present

## 2019-07-31 DIAGNOSIS — N184 Chronic kidney disease, stage 4 (severe): Secondary | ICD-10-CM | POA: Diagnosis not present

## 2019-07-31 DIAGNOSIS — N179 Acute kidney failure, unspecified: Secondary | ICD-10-CM | POA: Diagnosis not present

## 2019-07-31 DIAGNOSIS — R609 Edema, unspecified: Secondary | ICD-10-CM | POA: Diagnosis not present

## 2019-07-31 DIAGNOSIS — M109 Gout, unspecified: Secondary | ICD-10-CM | POA: Diagnosis not present

## 2019-07-31 DIAGNOSIS — N2581 Secondary hyperparathyroidism of renal origin: Secondary | ICD-10-CM | POA: Diagnosis not present

## 2019-07-31 DIAGNOSIS — I129 Hypertensive chronic kidney disease with stage 1 through stage 4 chronic kidney disease, or unspecified chronic kidney disease: Secondary | ICD-10-CM | POA: Diagnosis not present

## 2019-08-02 DIAGNOSIS — N184 Chronic kidney disease, stage 4 (severe): Secondary | ICD-10-CM | POA: Diagnosis not present

## 2019-08-02 DIAGNOSIS — N179 Acute kidney failure, unspecified: Secondary | ICD-10-CM | POA: Diagnosis not present

## 2019-08-02 DIAGNOSIS — R600 Localized edema: Secondary | ICD-10-CM | POA: Diagnosis not present

## 2019-08-03 DIAGNOSIS — N179 Acute kidney failure, unspecified: Secondary | ICD-10-CM | POA: Diagnosis not present

## 2019-08-03 DIAGNOSIS — E8779 Other fluid overload: Secondary | ICD-10-CM | POA: Diagnosis not present

## 2019-08-08 DIAGNOSIS — N2581 Secondary hyperparathyroidism of renal origin: Secondary | ICD-10-CM | POA: Diagnosis not present

## 2019-08-08 DIAGNOSIS — R609 Edema, unspecified: Secondary | ICD-10-CM | POA: Diagnosis not present

## 2019-08-08 DIAGNOSIS — I129 Hypertensive chronic kidney disease with stage 1 through stage 4 chronic kidney disease, or unspecified chronic kidney disease: Secondary | ICD-10-CM | POA: Diagnosis not present

## 2019-08-08 DIAGNOSIS — M109 Gout, unspecified: Secondary | ICD-10-CM | POA: Diagnosis not present

## 2019-08-08 DIAGNOSIS — N184 Chronic kidney disease, stage 4 (severe): Secondary | ICD-10-CM | POA: Diagnosis not present

## 2019-08-08 DIAGNOSIS — N179 Acute kidney failure, unspecified: Secondary | ICD-10-CM | POA: Diagnosis not present

## 2019-08-09 DIAGNOSIS — N184 Chronic kidney disease, stage 4 (severe): Secondary | ICD-10-CM | POA: Diagnosis not present

## 2019-08-09 DIAGNOSIS — N2581 Secondary hyperparathyroidism of renal origin: Secondary | ICD-10-CM | POA: Diagnosis not present

## 2019-08-09 DIAGNOSIS — R600 Localized edema: Secondary | ICD-10-CM | POA: Diagnosis not present

## 2019-08-10 DIAGNOSIS — E8779 Other fluid overload: Secondary | ICD-10-CM | POA: Diagnosis not present

## 2019-08-10 DIAGNOSIS — N179 Acute kidney failure, unspecified: Secondary | ICD-10-CM | POA: Diagnosis not present

## 2019-08-12 DIAGNOSIS — N179 Acute kidney failure, unspecified: Secondary | ICD-10-CM | POA: Diagnosis not present

## 2019-08-12 DIAGNOSIS — E8779 Other fluid overload: Secondary | ICD-10-CM | POA: Diagnosis not present

## 2019-08-14 ENCOUNTER — Other Ambulatory Visit: Payer: Self-pay

## 2019-08-14 ENCOUNTER — Ambulatory Visit: Payer: Medicare PPO | Admitting: Internal Medicine

## 2019-08-14 ENCOUNTER — Encounter: Payer: Self-pay | Admitting: Internal Medicine

## 2019-08-14 VITALS — BP 130/78 | HR 75 | Resp 16 | Ht 63.0 in | Wt 158.0 lb

## 2019-08-14 DIAGNOSIS — I509 Heart failure, unspecified: Secondary | ICD-10-CM

## 2019-08-14 DIAGNOSIS — N2581 Secondary hyperparathyroidism of renal origin: Secondary | ICD-10-CM | POA: Diagnosis not present

## 2019-08-14 DIAGNOSIS — N183 Chronic kidney disease, stage 3 unspecified: Secondary | ICD-10-CM

## 2019-08-14 DIAGNOSIS — N184 Chronic kidney disease, stage 4 (severe): Secondary | ICD-10-CM | POA: Diagnosis not present

## 2019-08-14 DIAGNOSIS — K219 Gastro-esophageal reflux disease without esophagitis: Secondary | ICD-10-CM

## 2019-08-14 DIAGNOSIS — R600 Localized edema: Secondary | ICD-10-CM | POA: Diagnosis not present

## 2019-08-14 DIAGNOSIS — J4520 Mild intermittent asthma, uncomplicated: Secondary | ICD-10-CM | POA: Diagnosis not present

## 2019-08-14 DIAGNOSIS — R0602 Shortness of breath: Secondary | ICD-10-CM | POA: Diagnosis not present

## 2019-08-14 NOTE — Progress Notes (Signed)
[facility] [place], [place] [address]  Pulmonary Sleep Medicine   Office Visit Note  Patient Name: Lori Johnson DOB: 07-10-1930 MRN [id number]  Date of Service: 08/14/2019  Complaints/HPI: Pt is here for follow up.  Overall she is improved.  She reports she is able to go without oxygen for some time when the humidity is low. She continues to use oxygen while sleeping, and walking.  She reports that she began having temporary dialysis after her last bout of PNA.  The nephrologist is doing temporary dialysis to help her kidneys recover.  She is already down to one treatment every few weeks at this point.  She reports she is walking for exercise in the mornings, and overall feeling good.    ROS  General: (-) fever, (-) chills, (-) night sweats, (-) weakness Skin: (-) rashes, (-) itching,. Eyes: (-) visual changes, (-) redness, (-) itching. Nose and Sinuses: (-) nasal stuffiness or itchiness, (-) postnasal drip, (-) nosebleeds, (-) sinus trouble. Mouth and Throat: (-) sore throat, (-) hoarseness. Neck: (-) swollen glands, (-) enlarged thyroid, (-) neck pain. Respiratory: - cough, (-) bloody sputum, - shortness of breath, - wheezing. Cardiovascular: - ankle swelling, (-) chest pain. Lymphatic: (-) lymph node enlargement. Neurologic: (-) numbness, (-) tingling. Psychiatric: (-) anxiety, (-) depression   Current Medication: Outpatient Encounter Medications as of 08/14/2019  Medication Sig  . Acetaminophen (TYLENOL ARTHRITIS PAIN PO) Take by mouth as needed.   [name] albuterol (VENTOLIN HFA) 108 (90 Base) MCG/ACT inhaler TAKE 2 PUFFS BY MOUTH EVERY 6 HOURS AS NEEDED FOR WHEEZE OR SHORTNESS OF BREATH  . allopurinol (ZYLOPRIM) 100 MG tablet Take 100 mg by mouth 2 (two) times daily.   [name] aspirin EC 81 MG tablet Take 81 mg by mouth daily.  . colchicine 0.6 MG tablet Take 0.5 tablets (0.3 mg total) by mouth 3 (three) times a week.  . hydrocortisone valerate cream (WESTCORT)  0.2 % Apply 1 application topically as needed.   . Loratadine (CLARITIN PO) Take 10 mg by mouth daily.   . Multiple Vitamins-Minerals (ICAPS AREDS 2 PO) Take 1 tablet by mouth daily.  . OXYGEN Inhale 2 L into the lungs.  . pantoprazole (PROTONIX) 20 MG tablet Take 2 caps. Daily in am. (Patient taking differently: Take by mouth. Take 2 caps. Daily in am.)  . potassium chloride (MICRO-K) 10 MEQ CR capsule   . pravastatin (PRAVACHOL) 20 MG tablet TAKE 1 TABLET AT BEDTIME  . torsemide (DEMADEX) 20 MG tablet   . vitamin B-12 (CYANOCOBALAMIN) 100 MCG tablet Take 100 mcg by mouth daily.   No facility-administered encounter medications on file as of 08/14/2019.     Surgical History: Past Surgical History:  Procedure Laterality Date  . ABDOMINAL HYSTERECTOMY  1981  . arterial bypass right leg Right   . broken left foot Left 01/2002  . CHOLECYSTECTOMY    . CORONARY ANGIOPLASTY WITH STENT PLACEMENT    . DIALYSIS/PERMA CATHETER INSERTION N/A 06/01/2019   Procedure: DIALYSIS/PERMA CATHETER INSERTION;  Surgeon: Algernon Huxley, MD;  Location: [place] [facility];  Service: Cardiovascular;  Laterality: N/A;  . stent in leg    . TEMPORARY DIALYSIS CATHETER N/A 05/31/2019   Procedure: TEMPORARY DIALYSIS CATHETER;  Surgeon: Algernon Huxley, MD;  Location: [place] [facility];  Service: Cardiovascular;  Laterality: N/A;    Medical History: Past Medical History:  Diagnosis Date  . Angiopathy, peripheral ([place])   . Arterial embolus and thrombosis ([place])   .  ASCVD (arteriosclerotic cardiovascular disease)    with stent RCA  . Bilateral bunions   . Cancer ([place])    SCC removed off left leg  . Cervical lymphadenitis   . Chronic gout of left ankle due to renal impairment without tophus   . Chronic infection June 2014   Has chronic infection of right femoral graft with history of extensive debridement in June 2014. Was on oral Clindamycin for 3 weeks for culture positive for anerobes and staph lugdunensis.  Vascular Surgeon at [facility] Remonia Richter, MD) recommended indefinite suppressive therapy with Cephalexin 500 mg qd and reassessment every 3 months. Will refill Cephalexin.)  . Chronic kidney disease   . COPD (chronic obstructive pulmonary disease) ([place])   . CRF (chronic renal failure)   . GERD (gastroesophageal reflux disease)   . Hiatal hernia   . History of arterial bypass of lower extremity   . Hyperlipidemia   . Hypertension   . Hyperuricemia   . Macular degeneration   . Mass of right side of neck   . Nocturia   . OA (osteoarthritis)   . PVD (peripheral vascular disease) ([place])   . Renal disorder     Family History: Family History  Problem Relation Age of Onset  . Heart disease Mother   . Heart disease Father     Social History: Social History   Socioeconomic History  . Marital status: Widowed    Spouse name: Not on file  . Number of children: 3  . Years of education: Not on file  . Highest education level: 9th grade  Occupational History  . Occupation: retired  Scientific laboratory technician  . Financial resource strain: Not hard at all  . Food insecurity    Worry: Never true    Inability: Never true  . Transportation needs    Medical: No    Non-medical: No  Tobacco Use  . Smoking status: Former Smoker    Types: Cigarettes  . Smokeless tobacco: Never Used  . Tobacco comment: quit >30-40 years ago  Substance and Sexual Activity  . Alcohol use: No  . Drug use: No  . Sexual activity: Not Currently  Lifestyle  . Physical activity    Days per week: 0 days    Minutes per session: 0 min  . Stress: Not at all  Relationships  . Social Herbalist on phone: Patient refused    Gets together: Patient refused    Attends religious service: Patient refused    Active member of club or organization: Patient refused    Attends meetings of clubs or organizations: Patient refused    Relationship status: Patient refused  . Intimate partner violence    Fear of current or ex  partner: Patient refused    Emotionally abused: Patient refused    Physically abused: Patient refused    Forced sexual activity: Patient refused  Other Topics Concern  . Not on file  Social History Narrative   Lives with daughter. Independent at baseline. Uses walker only for outside    Vital Signs: Blood pressure 130/78, pulse 75, resp. rate 16, height 5\' 3"  (1.6 m), weight 158 lb (71.7 kg), SpO2 97 %.  Examination: General Appearance: The patient is well-developed, well-nourished, and in no distress. Skin: Gross inspection of skin unremarkable. Head: normocephalic, no gross deformities. Eyes: no gross deformities noted. ENT: ears appear grossly normal no exudates. Neck: Supple. No thyromegaly. No LAD. Respiratory: clear bilaterally with some diminished sounds in bases.. Cardiovascular: Normal S1  and S2 without murmur or rub. Extremities: No cyanosis. pulses are equal. Neurologic: Alert and oriented. No involuntary movements.  LABS: Recent Results (from the past 2160 hour(s))  CBC with Differential/Platelet     Status: Abnormal   Collection Time: 05/16/19 11:39 AM  Result Value Ref Range   WBC 6.2 3.4 - 10.8 x10E3/uL   RBC 2.94 (L) 3.77 - 5.28 x10E6/uL   Hemoglobin 9.2 (L) 11.1 - 15.9 g/dL   Hematocrit 27.3 (L) 34.0 - 46.6 %   MCV 93 79 - 97 fL   MCH 31.3 26.6 - 33.0 pg   MCHC 33.7 31.5 - 35.7 g/dL   RDW 12.9 11.7 - 15.4 %   Platelets 195 150 - 450 x10E3/uL   Neutrophils 63 Not Estab. %   Lymphs 17 Not Estab. %   Monocytes 14 Not Estab. %   Eos 5 Not Estab. %   Basos 1 Not Estab. %   Neutrophils Absolute 3.9 1.4 - 7.0 x10E3/uL   Lymphocytes Absolute 1.0 0.7 - 3.1 x10E3/uL   Monocytes Absolute 0.9 0.1 - 0.9 x10E3/uL   EOS (ABSOLUTE) 0.3 0.0 - 0.4 x10E3/uL   Basophils Absolute 0.1 0.0 - 0.2 x10E3/uL   Immature Granulocytes 0 Not Estab. %   Immature Grans (Abs) 0.0 0.0 - 0.1 x10E3/uL  Comprehensive metabolic panel     Status: Abnormal   Collection Time: 05/16/19  11:39 AM  Result Value Ref Range   Glucose 105 (H) 65 - 99 mg/dL   BUN 60 (H) 8 - 27 mg/dL   Creatinine, Ser 2.58 (H) 0.57 - 1.00 mg/dL   GFR calc non Af Amer 16 (L) >59 mL/min/1.73   GFR calc Af Amer 19 (L) >59 mL/min/1.73   BUN/Creatinine Ratio 23 12 - 28   Sodium 131 (L) 134 - 144 mmol/L   Potassium 4.2 3.5 - 5.2 mmol/L   Chloride 93 (L) 96 - 106 mmol/L   CO2 21 20 - 29 mmol/L   Calcium 9.9 8.7 - 10.3 mg/dL   Total Protein 6.3 6.0 - 8.5 g/dL   Albumin 4.2 3.6 - 4.6 g/dL   Globulin, Total 2.1 1.5 - 4.5 g/dL   Albumin/Globulin Ratio 2.0 1.2 - 2.2   Bilirubin Total 0.7 0.0 - 1.2 mg/dL   Alkaline Phosphatase 70 39 - 117 IU/L   AST 18 0 - 40 IU/L   ALT 10 0 - 32 IU/L  IFOBT POC (occult bld, rslt in office)     Status: Normal   Collection Time: 05/19/19 10:08 AM  Result Value Ref Range   IFOBT Negative   Comprehensive metabolic panel     Status: Abnormal   Collection Time: 05/30/19 12:52 PM  Result Value Ref Range   Sodium 125 (L) 135 - 145 mmol/L   Potassium 5.1 3.5 - 5.1 mmol/L   Chloride 91 (L) 98 - 111 mmol/L   CO2 17 (L) 22 - 32 mmol/L   Glucose, Bld 124 (H) 70 - 99 mg/dL   BUN 113 (H) 8 - 23 mg/dL    Comment: RESULT CONFIRMED BY MANUAL DILUTION FMW   Creatinine, Ser 6.16 (H) 0.44 - 1.00 mg/dL   Calcium 9.0 8.9 - 10.3 mg/dL   Total Protein 6.5 6.5 - 8.1 g/dL   Albumin 3.9 3.5 - 5.0 g/dL   AST 32 15 - 41 U/L   ALT 18 0 - 44 U/L   Alkaline Phosphatase 59 38 - 126 U/L   Total Bilirubin 0.9 0.3 - 1.2 mg/dL  GFR calc non Af Amer 6 (L) >60 mL/min   GFR calc Af Amer 7 (L) >60 mL/min   Anion gap 17 (H) 5 - 15    Comment: Performed at [facility], [address] [place], [place] [address]  CBC     Status: Abnormal   Collection Time: 05/30/19 12:52 PM  Result Value Ref Range   WBC 5.1 4.0 - 10.5 K/uL   RBC 2.88 (L) 3.87 - 5.11 MIL/uL   Hemoglobin 8.7 (L) 12.0 - 15.0 g/dL   HCT 26.7 (L) 36.0 - 46.0 %   MCV 92.7 80.0 - 100.0 fL   MCH 30.2 26.0 - 34.0 pg    MCHC 32.6 30.0 - 36.0 g/dL   RDW 14.5 11.5 - 15.5 %   Platelets 156 150 - 400 K/uL   nRBC 0.0 0.0 - 0.2 %    Comment: Performed at [facility], [address] [place], [place] [address]  Creatinine, serum     Status: Abnormal   Collection Time: 05/30/19 12:52 PM  Result Value Ref Range   Creatinine, Ser 6.20 (H) 0.44 - 1.00 mg/dL   GFR calc non Af Amer 6 (L) >60 mL/min   GFR calc Af Amer 6 (L) >60 mL/min    Comment: Performed at [facility], [address], [place], [place] [address]  SARS Coronavirus 2 (CEPHEID - Performed in [place] hospital lab), Hosp Order     Status: None   Collection Time: 05/30/19  1:15 PM   Specimen: Nasopharyngeal Swab  Result Value Ref Range   SARS Coronavirus 2 NEGATIVE NEGATIVE    Comment: (NOTE) If result is NEGATIVE SARS-CoV-2 target nucleic acids are NOT DETECTED. The SARS-CoV-2 RNA is generally detectable in upper and lower  respiratory specimens during the acute phase of infection. The lowest  concentration of SARS-CoV-2 viral copies this assay can detect is 250  copies / mL. A negative result does not preclude SARS-CoV-2 infection  and should not be used as the sole basis for treatment or other  patient management decisions.  A negative result may occur with  improper specimen collection / handling, submission of specimen other  than nasopharyngeal swab, presence of viral mutation(s) within the  areas targeted by this assay, and inadequate number of viral copies  (<250 copies / mL). A negative result must be combined with clinical  observations, patient history, and epidemiological information. If result is POSITIVE SARS-CoV-2 target nucleic acids are DETECTED. The SARS-CoV-2 RNA is generally detectable in upper and lower  respiratory specimens dur ing the acute phase of infection.  Positive  results are indicative of active infection with SARS-CoV-2.  Clinical  correlation with patient history and other diagnostic  information is  necessary to determine patient infection status.  Positive results do  not rule out bacterial infection or co-infection with other viruses. If result is PRESUMPTIVE POSTIVE SARS-CoV-2 nucleic acids MAY BE PRESENT.   A presumptive positive result was obtained on the submitted specimen  and confirmed on repeat testing.  While 2019 novel coronavirus  (SARS-CoV-2) nucleic acids may be present in the submitted sample  additional confirmatory testing may be necessary for epidemiological  and / or clinical management purposes  to differentiate between  SARS-CoV-2 and other Sarbecovirus currently known to infect humans.  If clinically indicated additional testing with an alternate test  methodology [phone]) is advised. The SARS-CoV-2 RNA is generally  detectable in upper and lower respiratory sp ecimens during the acute  phase  of infection. The expected result is Negative. Fact Sheet for Patients:  StrictlyIdeas.no Fact Sheet for Healthcare Providers: BankingDealers.co.za This test is not yet approved or cleared by the [place] FDA and has been authorized for detection and/or diagnosis of SARS-CoV-2 by FDA under an Emergency Use Authorization (EUA).  This EUA will remain in effect (meaning this test can be used) for the duration of the COVID-19 declaration under Section 564(b)(1) of the Act, 21 U.S.C. section 360bbb-3(b)(1), unless the authorization is terminated or revoked sooner. Performed at [facility], [address] [place], [place] [address]   Basic metabolic panel     Status: Abnormal   Collection Time: 05/31/19  5:00 AM  Result Value Ref Range   Sodium 125 (L) 135 - 145 mmol/L   Potassium 5.1 3.5 - 5.1 mmol/L   Chloride 93 (L) 98 - 111 mmol/L   CO2 19 (L) 22 - 32 mmol/L   Glucose, Bld 109 (H) 70 - 99 mg/dL   BUN 115 (H) 8 - 23 mg/dL    Comment: RESULT CONFIRMED BY MANUAL DILUTION SDR   Creatinine,  Ser 6.58 (H) 0.44 - 1.00 mg/dL   Calcium 8.5 (L) 8.9 - 10.3 mg/dL   GFR calc non Af Amer 5 (L) >60 mL/min   GFR calc Af Amer 6 (L) >60 mL/min   Anion gap 13 5 - 15    Comment: Performed at [facility], [address] [place], [place] [address]  CBC     Status: Abnormal   Collection Time: 05/31/19  5:00 AM  Result Value Ref Range   WBC 4.3 4.0 - 10.5 K/uL   RBC 2.75 (L) 3.87 - 5.11 MIL/uL   Hemoglobin 8.2 (L) 12.0 - 15.0 g/dL   HCT 25.6 (L) 36.0 - 46.0 %   MCV 93.1 80.0 - 100.0 fL   MCH 29.8 26.0 - 34.0 pg   MCHC 32.0 30.0 - 36.0 g/dL   RDW 14.6 11.5 - 15.5 %   Platelets 150 150 - 400 K/uL   nRBC 0.0 0.0 - 0.2 %    Comment: Performed at [facility], [address] [place], [place] [address]  Hepatitis B surface antigen     Status: None   Collection Time: 05/31/19  1:15 PM  Result Value Ref Range   Hepatitis B Surface Ag Negative Negative    Comment: (NOTE) Performed At: [facility] [place], [place] [id number] Rush Farmer MD [id number]   Hepatitis B surface antibody     Status: Abnormal   Collection Time: 05/31/19  1:15 PM  Result Value Ref Range   Hepatitis B-Post 7.3 (L) Immunity>9.9 mIU/mL    Comment: (NOTE)  Status of Immunity                     Anti-HBs Level  ------------------                     -------------- Inconsistent with Immunity                   0.0 - 9.9 Consistent with Immunity                          >9.9 Performed At: [facility] [place], [place] [id number] Rush Farmer MD [id number]   Hepatitis B core antibody, IgM     Status: None   Collection Time: 05/31/19  1:15  PM  Result Value Ref Range   Hep B C IgM Negative Negative    Comment: (NOTE) Performed At: [facility] [place], [place] [id number] Rush Farmer MD [id number]   Basic metabolic panel     Status: Abnormal   Collection Time: 06/01/19  4:56 AM  Result Value Ref Range    Sodium 130 (L) 135 - 145 mmol/L   Potassium 4.4 3.5 - 5.1 mmol/L   Chloride 96 (L) 98 - 111 mmol/L   CO2 20 (L) 22 - 32 mmol/L   Glucose, Bld 96 70 - 99 mg/dL   BUN 95 (H) 8 - 23 mg/dL   Creatinine, Ser 5.22 (H) 0.44 - 1.00 mg/dL   Calcium 8.7 (L) 8.9 - 10.3 mg/dL   GFR calc non Af Amer 7 (L) >60 mL/min   GFR calc Af Amer 8 (L) >60 mL/min   Anion gap 14 5 - 15    Comment: Performed at [facility], [address] [place], [place] [address]  Phosphorus     Status: Abnormal   Collection Time: 06/01/19 10:30 AM  Result Value Ref Range   Phosphorus 6.9 (H) 2.5 - 4.6 mg/dL    Comment: Performed at [facility], [address] [place], [place] [address]  Basic metabolic panel     Status: Abnormal   Collection Time: 06/02/19  6:04 AM  Result Value Ref Range   Sodium 132 (L) 135 - 145 mmol/L   Potassium 4.2 3.5 - 5.1 mmol/L   Chloride 97 (L) 98 - 111 mmol/L   CO2 22 22 - 32 mmol/L   Glucose, Bld 95 70 - 99 mg/dL   BUN 69 (H) 8 - 23 mg/dL   Creatinine, Ser 3.77 (H) 0.44 - 1.00 mg/dL   Calcium 8.7 (L) 8.9 - 10.3 mg/dL   GFR calc non Af Amer 10 (L) >60 mL/min   GFR calc Af Amer 12 (L) >60 mL/min   Anion gap 13 5 - 15    Comment: Performed at [facility], [address] [place], [place] [address]  Phosphorus     Status: Abnormal   Collection Time: 06/02/19  6:04 AM  Result Value Ref Range   Phosphorus 5.4 (H) 2.5 - 4.6 mg/dL    Comment: Performed at [facility], [address] [place], [place] [address]  Basic metabolic panel     Status: Abnormal   Collection Time: 06/03/19  6:09 AM  Result Value Ref Range   Sodium 137 135 - 145 mmol/L   Potassium 4.2 3.5 - 5.1 mmol/L   Chloride 97 (L) 98 - 111 mmol/L   CO2 26 22 - 32 mmol/L   Glucose, Bld 103 (H) 70 - 99 mg/dL   BUN 40 (H) 8 - 23 mg/dL   Creatinine, Ser 2.63 (H) 0.44 - 1.00 mg/dL   Calcium 9.1 8.9 - 10.3 mg/dL   GFR calc non Af Amer 16 (L) >60 mL/min   GFR calc Af Amer 18 (L) >60  mL/min   Anion gap 14 5 - 15    Comment: Performed at [facility], [address] [place], [place] [address]  Basic metabolic panel     Status: Abnormal   Collection Time: 06/04/19  6:29 AM  Result Value Ref Range   Sodium 135 135 - 145 mmol/L   Potassium 3.9 3.5 - 5.1 mmol/L   Chloride 99 98 - 111 mmol/L   CO2 25 22 - 32 mmol/L  Glucose, Bld 105 (H) 70 - 99 mg/dL   BUN 45 (H) 8 - 23 mg/dL   Creatinine, Ser 2.89 (H) 0.44 - 1.00 mg/dL   Calcium 9.1 8.9 - 10.3 mg/dL   GFR calc non Af Amer 14 (L) >60 mL/min   GFR calc Af Amer 16 (L) >60 mL/min   Anion gap 11 5 - 15    Comment: Performed at [facility], [address] [place], [place] [address]  Phosphorus     Status: None   Collection Time: 06/04/19 11:28 AM  Result Value Ref Range   Phosphorus 3.7 2.5 - 4.6 mg/dL    Comment: Performed at [facility], [address] [place], [place] [address]    Radiology: No results found.  No results found.  No results found.    Assessment and Plan: Patient Active Problem List   Diagnosis Date Noted  . Acute CHF (congestive heart failure) ([place]) 05/09/2019  . Acute respiratory failure ([place]) 05/08/2019  . GI bleed 10/16/2017  . Closed fracture of tibial plateau 06/24/2015  . Current tear knee, medial meniscus 06/24/2015  . Arthritis of knee, degenerative 06/24/2015  . Angiopathy, peripheral ([place]) 06/06/2015  . Arterial vascular disease 06/06/2015  . Bunion 06/06/2015  . Cervical adenitis 06/06/2015  . Gout due to renal impairment of multiple sites 06/06/2015  . Chronic infection 06/06/2015  . Contusion of forearm 06/06/2015  . Arm bruise 06/06/2015  . Asthma, cough variant 06/06/2015  . Gastroesophageal reflux disease with hiatal hernia 06/06/2015  . History of DVT of lower extremity 06/06/2015  . Bergmann's syndrome 06/06/2015  . H/O arterial bypass of lower limb 06/06/2015  . HLD (hyperlipidemia) 06/06/2015  . Benign hypertension 06/06/2015   . Elevated blood uric acid level 06/06/2015  . Disorder of kidney 06/06/2015  . Lump in neck 06/06/2015  . Excessive urination at night 06/06/2015  . Arthritis, degenerative 06/06/2015  . Poor balance 06/06/2015  . Peripheral blood vessel disorder ([place]) 06/06/2015  . Acid reflux 03/08/2013  . Atherosclerosis of native artery of extremity ([place]) 01/22/2013  . Chronic kidney disease 01/22/2013  . CAD in native artery 01/22/2013  . Essential (primary) hypertension 01/22/2013  . Atherosclerosis of native arteries of extremity with rest pain ([place]) 01/22/2013  . Arthralgia of hip or thigh 03/29/2006    1. Mild intermittent asthma without complication Appear stable at this time continue to use current medications as directed.  2. Congestive heart failure, unspecified HF chronicity, unspecified heart failure type [facility]) Patient appears to be doing well from her bout with CHF.  We will continue to follow-up with cardiology as planned.  3. CKD (chronic kidney disease), stage III ([place]) Patient is receiving intermittent dialysis at this time hopes to be weaned completely from dialysis.  She is currently doing well will continue to follow nephrology recommendations.  4. Gastroesophageal reflux disease without esophagitis Stable, continue present medications.  5. SOB (shortness of breath) - Spirometry with Graph  General Counseling: I have discussed the findings of the evaluation and examination with [place].  I have also discussed any further diagnostic evaluation thatmay be needed or ordered today. Zacaria verbalizes understanding of the findings of todays visit. We also reviewed her medications today and discussed drug interactions and side effects including but not limited excessive drowsiness and altered mental states. We also discussed that there is always a risk not just to her but also people around her. she has been encouraged to call the office with any questions or concerns  that should arise  related to todays visit.    Time spent: 25 This patient was seen by Orson Gear AGNP-C in Collaboration with Dr. Devona Konig as a part of collaborative care agreement.   I have personally obtained a history, examined the patient, evaluated laboratory and imaging results, formulated the assessment and plan and placed orders.    Allyne Gee, MD [facility] [place] [facility] Sleep medicine

## 2019-08-15 DIAGNOSIS — N179 Acute kidney failure, unspecified: Secondary | ICD-10-CM | POA: Diagnosis not present

## 2019-08-15 DIAGNOSIS — E8779 Other fluid overload: Secondary | ICD-10-CM | POA: Diagnosis not present

## 2019-08-16 DIAGNOSIS — R609 Edema, unspecified: Secondary | ICD-10-CM | POA: Diagnosis not present

## 2019-08-16 DIAGNOSIS — N184 Chronic kidney disease, stage 4 (severe): Secondary | ICD-10-CM | POA: Diagnosis not present

## 2019-08-16 DIAGNOSIS — D631 Anemia in chronic kidney disease: Secondary | ICD-10-CM | POA: Diagnosis not present

## 2019-08-16 DIAGNOSIS — N2581 Secondary hyperparathyroidism of renal origin: Secondary | ICD-10-CM | POA: Diagnosis not present

## 2019-08-17 DIAGNOSIS — J449 Chronic obstructive pulmonary disease, unspecified: Secondary | ICD-10-CM | POA: Diagnosis not present

## 2019-08-21 DIAGNOSIS — N184 Chronic kidney disease, stage 4 (severe): Secondary | ICD-10-CM | POA: Diagnosis not present

## 2019-08-22 DIAGNOSIS — R609 Edema, unspecified: Secondary | ICD-10-CM | POA: Diagnosis not present

## 2019-08-22 DIAGNOSIS — N184 Chronic kidney disease, stage 4 (severe): Secondary | ICD-10-CM | POA: Diagnosis not present

## 2019-08-24 DIAGNOSIS — E8779 Other fluid overload: Secondary | ICD-10-CM | POA: Diagnosis not present

## 2019-08-24 DIAGNOSIS — N179 Acute kidney failure, unspecified: Secondary | ICD-10-CM | POA: Diagnosis not present

## 2019-08-25 ENCOUNTER — Other Ambulatory Visit: Payer: Self-pay | Admitting: Family Medicine

## 2019-08-25 DIAGNOSIS — J4541 Moderate persistent asthma with (acute) exacerbation: Secondary | ICD-10-CM

## 2019-08-26 DIAGNOSIS — E8779 Other fluid overload: Secondary | ICD-10-CM | POA: Diagnosis not present

## 2019-08-26 DIAGNOSIS — N179 Acute kidney failure, unspecified: Secondary | ICD-10-CM | POA: Diagnosis not present

## 2019-08-28 DIAGNOSIS — N184 Chronic kidney disease, stage 4 (severe): Secondary | ICD-10-CM | POA: Diagnosis not present

## 2019-08-29 DIAGNOSIS — E8779 Other fluid overload: Secondary | ICD-10-CM | POA: Diagnosis not present

## 2019-08-29 DIAGNOSIS — N179 Acute kidney failure, unspecified: Secondary | ICD-10-CM | POA: Diagnosis not present

## 2019-08-30 DIAGNOSIS — N184 Chronic kidney disease, stage 4 (severe): Secondary | ICD-10-CM | POA: Diagnosis not present

## 2019-08-30 DIAGNOSIS — I129 Hypertensive chronic kidney disease with stage 1 through stage 4 chronic kidney disease, or unspecified chronic kidney disease: Secondary | ICD-10-CM | POA: Diagnosis not present

## 2019-08-30 DIAGNOSIS — R600 Localized edema: Secondary | ICD-10-CM | POA: Diagnosis not present

## 2019-08-30 DIAGNOSIS — N179 Acute kidney failure, unspecified: Secondary | ICD-10-CM | POA: Diagnosis not present

## 2019-09-11 DIAGNOSIS — N184 Chronic kidney disease, stage 4 (severe): Secondary | ICD-10-CM | POA: Diagnosis not present

## 2019-09-12 DIAGNOSIS — R600 Localized edema: Secondary | ICD-10-CM | POA: Diagnosis not present

## 2019-09-12 DIAGNOSIS — N179 Acute kidney failure, unspecified: Secondary | ICD-10-CM | POA: Diagnosis not present

## 2019-09-12 DIAGNOSIS — E8779 Other fluid overload: Secondary | ICD-10-CM | POA: Diagnosis not present

## 2019-09-12 DIAGNOSIS — N184 Chronic kidney disease, stage 4 (severe): Secondary | ICD-10-CM | POA: Diagnosis not present

## 2019-09-12 DIAGNOSIS — D631 Anemia in chronic kidney disease: Secondary | ICD-10-CM | POA: Diagnosis not present

## 2019-09-14 DIAGNOSIS — N179 Acute kidney failure, unspecified: Secondary | ICD-10-CM | POA: Diagnosis not present

## 2019-09-14 DIAGNOSIS — E8779 Other fluid overload: Secondary | ICD-10-CM | POA: Diagnosis not present

## 2019-09-16 DIAGNOSIS — J449 Chronic obstructive pulmonary disease, unspecified: Secondary | ICD-10-CM | POA: Diagnosis not present

## 2019-09-19 DIAGNOSIS — E8779 Other fluid overload: Secondary | ICD-10-CM | POA: Diagnosis not present

## 2019-09-19 DIAGNOSIS — N179 Acute kidney failure, unspecified: Secondary | ICD-10-CM | POA: Diagnosis not present

## 2019-09-21 DIAGNOSIS — N179 Acute kidney failure, unspecified: Secondary | ICD-10-CM | POA: Diagnosis not present

## 2019-09-21 DIAGNOSIS — E8779 Other fluid overload: Secondary | ICD-10-CM | POA: Diagnosis not present

## 2019-09-25 DIAGNOSIS — D631 Anemia in chronic kidney disease: Secondary | ICD-10-CM | POA: Diagnosis not present

## 2019-09-25 DIAGNOSIS — N184 Chronic kidney disease, stage 4 (severe): Secondary | ICD-10-CM | POA: Diagnosis not present

## 2019-09-25 DIAGNOSIS — R600 Localized edema: Secondary | ICD-10-CM | POA: Diagnosis not present

## 2019-09-25 DIAGNOSIS — N179 Acute kidney failure, unspecified: Secondary | ICD-10-CM | POA: Diagnosis not present

## 2019-09-26 ENCOUNTER — Ambulatory Visit (INDEPENDENT_AMBULATORY_CARE_PROVIDER_SITE_OTHER): Payer: Medicare PPO

## 2019-09-26 ENCOUNTER — Other Ambulatory Visit: Payer: Self-pay

## 2019-09-26 DIAGNOSIS — Z23 Encounter for immunization: Secondary | ICD-10-CM | POA: Diagnosis not present

## 2019-09-27 DIAGNOSIS — N184 Chronic kidney disease, stage 4 (severe): Secondary | ICD-10-CM | POA: Diagnosis not present

## 2019-10-02 ENCOUNTER — Other Ambulatory Visit: Payer: Self-pay | Admitting: Family Medicine

## 2019-10-03 DIAGNOSIS — N184 Chronic kidney disease, stage 4 (severe): Secondary | ICD-10-CM | POA: Diagnosis not present

## 2019-10-05 DIAGNOSIS — N184 Chronic kidney disease, stage 4 (severe): Secondary | ICD-10-CM | POA: Diagnosis not present

## 2019-10-05 DIAGNOSIS — R609 Edema, unspecified: Secondary | ICD-10-CM | POA: Diagnosis not present

## 2019-10-05 DIAGNOSIS — I10 Essential (primary) hypertension: Secondary | ICD-10-CM | POA: Diagnosis not present

## 2019-10-11 ENCOUNTER — Other Ambulatory Visit: Payer: Self-pay | Admitting: Family Medicine

## 2019-10-16 ENCOUNTER — Telehealth: Payer: Self-pay | Admitting: Family Medicine

## 2019-10-16 DIAGNOSIS — N184 Chronic kidney disease, stage 4 (severe): Secondary | ICD-10-CM | POA: Diagnosis not present

## 2019-10-16 NOTE — Telephone Encounter (Signed)
Received this request from [facility].  They got the request from [facility].  Do you keep her on antibiotic

## 2019-10-16 NOTE — Telephone Encounter (Signed)
[place] [facility] faxed refill request for the following medications:  cephALEXin (KEFLEX) 500 MG capsule   Please advise.  Thanks, [employer]

## 2019-10-17 DIAGNOSIS — J449 Chronic obstructive pulmonary disease, unspecified: Secondary | ICD-10-CM | POA: Diagnosis not present

## 2019-10-17 NOTE — Telephone Encounter (Signed)
Chronic infection of right femoral graft with history of extensive debridement in June 2014. Was on oral Clindamycin for 3 weeks for culture positive for anerobes and staph lugdunensis. Vascular Surgeon at [facility] Remonia Richter, MD) recommended indefinite suppressive therapy with Cephalexin 500 mg qd and reassessment every 3 months. During hospitalization for acute CHF and respiratory failure (discharged 05-10-19), this antibiotic was stopped.

## 2019-10-18 DIAGNOSIS — N179 Acute kidney failure, unspecified: Secondary | ICD-10-CM | POA: Diagnosis not present

## 2019-10-18 DIAGNOSIS — D631 Anemia in chronic kidney disease: Secondary | ICD-10-CM | POA: Diagnosis not present

## 2019-10-18 DIAGNOSIS — N184 Chronic kidney disease, stage 4 (severe): Secondary | ICD-10-CM | POA: Diagnosis not present

## 2019-10-18 DIAGNOSIS — R609 Edema, unspecified: Secondary | ICD-10-CM | POA: Diagnosis not present

## 2019-10-18 DIAGNOSIS — E8779 Other fluid overload: Secondary | ICD-10-CM | POA: Diagnosis not present

## 2019-10-18 DIAGNOSIS — N2581 Secondary hyperparathyroidism of renal origin: Secondary | ICD-10-CM | POA: Diagnosis not present

## 2019-10-18 DIAGNOSIS — I10 Essential (primary) hypertension: Secondary | ICD-10-CM | POA: Diagnosis not present

## 2019-10-19 DIAGNOSIS — N179 Acute kidney failure, unspecified: Secondary | ICD-10-CM | POA: Diagnosis not present

## 2019-10-19 DIAGNOSIS — E8779 Other fluid overload: Secondary | ICD-10-CM | POA: Diagnosis not present

## 2019-10-23 ENCOUNTER — Other Ambulatory Visit: Payer: Self-pay | Admitting: Family Medicine

## 2019-10-23 NOTE — Telephone Encounter (Signed)
Has an allergy to penicillin and was having an allergy reaction to cefdinir in June of 2020. When was the Cephalexin restarted after the Clindamycin?

## 2019-10-23 NOTE — Telephone Encounter (Signed)
I spoke with the daughter.  She said they had stopped the Cephalexan while being treated with the Clindamycin.  But, that she was to go back on and stay on the [place].  She said last year the cardiologist had said she was doing well enough that he did not need to see her for the refills of this and that they could have you do the refills.   Daughter said she has been out of it for 1 week now and now that she is doing dialysis the daughter is very concerned about something happening if she does not get back on it.

## 2019-10-23 NOTE — Telephone Encounter (Signed)
[name] (daughter) called in and stated that the Dr at [facility] told her that her mother would take this med everyday.  She stated that she has been taking this for years and it should not be stopped.  She would like a call back from the nurse regarding the the Cephalexin  Best number [phone]

## 2019-10-23 NOTE — Telephone Encounter (Signed)
Lori Johnson states that Lori Johnson never stopped taking Cephalexin.  Please send a refill to [facility].   Thanks,   -Mickel Baas

## 2019-10-24 ENCOUNTER — Other Ambulatory Visit: Payer: Self-pay

## 2019-10-24 MED ORDER — CEPHALEXIN 500 MG PO CAPS: 500.0000 mg | ORAL_CAPSULE | Freq: Four times a day (QID) | ORAL | 3 refills | Status: AC

## 2019-10-24 NOTE — Telephone Encounter (Signed)
Done

## 2019-10-27 ENCOUNTER — Ambulatory Visit
Admission: EM | Admit: 2019-10-27 | Discharge: 2019-10-27 | Disposition: A | Discharge status: Home / Self Care | Payer: Medicare PPO | Attending: Emergency Medicine | Admitting: Emergency Medicine

## 2019-10-27 ENCOUNTER — Ambulatory Visit: Payer: Self-pay | Admitting: *Deleted

## 2019-10-27 DIAGNOSIS — J019 Acute sinusitis, unspecified: Secondary | ICD-10-CM | POA: Insufficient documentation

## 2019-10-27 DIAGNOSIS — J029 Acute pharyngitis, unspecified: Secondary | ICD-10-CM | POA: Insufficient documentation

## 2019-10-27 DIAGNOSIS — R0602 Shortness of breath: Secondary | ICD-10-CM

## 2019-10-27 DIAGNOSIS — H9203 Otalgia, bilateral: Secondary | ICD-10-CM | POA: Diagnosis not present

## 2019-10-27 DIAGNOSIS — R0981 Nasal congestion: Secondary | ICD-10-CM

## 2019-10-27 LAB — POCT RAPID STREP A (OFFICE): Rapid Strep A Screen: NEGATIVE

## 2019-10-27 MED ORDER — DOXYCYCLINE HYCLATE 100 MG PO CAPS: 100.0000 mg | ORAL_CAPSULE | Freq: Two times a day (BID) | ORAL | 0 refills | Status: AC

## 2019-10-27 NOTE — Telephone Encounter (Signed)
Patient's daughter calling on behalf on patient. Patient has had a sore throat for x4 days, not responsive to OTC medications. She is requesting call back from NT with recommendations.           Returned call to pt's daughter regarding her mom having a sore throat for 4 days.  She stated that she had been to dialysis last week but no where else. She denies fever. She has an earache, clears throat frequently and throat is red. Can not see pus in the back. She is able to drink but sometimes has a little problem with swallowing food.  She is advised to go to an urgent care for assessment, to rule out strep throat to test for flu. She did received the flu vaccine this year. She voiced understanding. Will route to [facility] for review.  Reason for Disposition . Earache also present  Answer Assessment - Initial Assessment Questions 1. ONSET: "When did the throat start hurting?" (Hours or days ago)      4 days ago 2. SEVERITY: "How bad is the sore throat?" (Scale 1-10; mild, moderate or severe)   - MILD (1-3):  doesn't interfere with eating or normal activities   - MODERATE (4-7): interferes with eating some solids and normal activities   - SEVERE (8-10):  excruciating pain, interferes with most normal activities   - SEVERE DYSPHAGIA: can't swallow liquids, drooling     Mild to moderate 3. STREP EXPOSURE: "Has there been any exposure to strep within the past week?" If so, ask: "What type of contact occurred?"      Went to dialysis last week 4.  VIRAL SYMPTOMS: "Are there any symptoms of a cold, such as a runny nose, cough, hoarse voice or red eyes?"      Hoarse voice and wants to clear throat a lot 5. FEVER: "Do you have a fever?" If so, ask: "What is your temperature, how was it measured, and when did it start?"     no 6. PUS ON THE TONSILS: "Is there pus on the tonsils in the back of your throat?"     no 7. OTHER SYMPTOMS: "Do you have any other symptoms?" (e.g.,  difficulty breathing, headache, rash)     no 8. PREGNANCY: "Is there any chance you are pregnant?" "When was your last menstrual period?"     n/a  Protocols used: SORE THROAT-A-AH

## 2019-10-27 NOTE — ED Provider Notes (Signed)
Lori Johnson    CSN: [id number] Arrival date & time: 10/27/19  1444      History   Chief Complaint Chief Complaint  Patient presents with  . Sore Throat    HPI Lori Johnson is a 85 y.o. female.   Accompanied by her daughter, patient presents with sore throat, postnasal drip, sinus congestion, ear pain, and shortness of breath.  Her daughter reports treating her symptoms at home with Mucinex this morning.  She denies fever, difficulty swallowing, cough, vomiting, diarrhea, or other symptoms.  Patient is on hemodialysis since June 2020.   The history is provided by the patient and a relative.    Past Medical History:  Diagnosis Date  . Angiopathy, peripheral ([place])   . Arterial embolus and thrombosis ([place])   . ASCVD (arteriosclerotic cardiovascular disease)    with stent RCA  . Bilateral bunions   . Cancer ([place])    SCC removed off left leg  . Cervical lymphadenitis   . Chronic gout of left ankle due to renal impairment without tophus   . Chronic infection June 2014   Has chronic infection of right femoral graft with history of extensive debridement in June 2014. Was on oral Clindamycin for 3 weeks for culture positive for anerobes and staph lugdunensis. Vascular Surgeon at [facility] Remonia Richter, MD) recommended indefinite suppressive therapy with Cephalexin 500 mg qd and reassessment every 3 months. Will refill Cephalexin.)  . Chronic kidney disease   . COPD (chronic obstructive pulmonary disease) ([place])   . CRF (chronic renal failure)   . GERD (gastroesophageal reflux disease)   . Hiatal hernia   . History of arterial bypass of lower extremity   . Hyperlipidemia   . Hypertension   . Hyperuricemia   . Macular degeneration   . Mass of right side of neck   . Nocturia   . OA (osteoarthritis)   . PVD (peripheral vascular disease) ([place])   . Renal disorder     Patient Active Problem List   Diagnosis Date Noted  . Acute CHF (congestive heart failure) ([place]) 05/09/2019  .  Acute respiratory failure ([place]) 05/08/2019  . GI bleed 10/16/2017  . Closed fracture of tibial plateau 06/24/2015  . Current tear knee, medial meniscus 06/24/2015  . Arthritis of knee, degenerative 06/24/2015  . Angiopathy, peripheral ([place]) 06/06/2015  . Arterial vascular disease 06/06/2015  . Bunion 06/06/2015  . Cervical adenitis 06/06/2015  . Gout due to renal impairment of multiple sites 06/06/2015  . Chronic infection 06/06/2015  . Contusion of forearm 06/06/2015  . Arm bruise 06/06/2015  . Asthma, cough variant 06/06/2015  . Gastroesophageal reflux disease with hiatal hernia 06/06/2015  . History of DVT of lower extremity 06/06/2015  . Bergmann's syndrome 06/06/2015  . H/O arterial bypass of lower limb 06/06/2015  . HLD (hyperlipidemia) 06/06/2015  . Benign hypertension 06/06/2015  . Elevated blood uric acid level 06/06/2015  . Disorder of kidney 06/06/2015  . Lump in neck 06/06/2015  . Excessive urination at night 06/06/2015  . Arthritis, degenerative 06/06/2015  . Poor balance 06/06/2015  . Peripheral blood vessel disorder ([place]) 06/06/2015  . Acid reflux 03/08/2013  . Atherosclerosis of native artery of extremity ([place]) 01/22/2013  . Chronic kidney disease 01/22/2013  . CAD in native artery 01/22/2013  . Essential (primary) hypertension 01/22/2013  . Atherosclerosis of native arteries of extremity with rest pain ([place]) 01/22/2013  . Arthralgia of hip or thigh 03/29/2006    Past Surgical History:  Procedure Laterality Date  .  ABDOMINAL HYSTERECTOMY  1981  . arterial bypass right leg Right   . broken left foot Left 01/2002  . CHOLECYSTECTOMY    . CORONARY ANGIOPLASTY WITH STENT PLACEMENT    . DIALYSIS/PERMA CATHETER INSERTION N/A 06/01/2019   Procedure: DIALYSIS/PERMA CATHETER INSERTION;  Surgeon: Algernon Huxley, MD;  Location: [facility];  Service: Cardiovascular;  Laterality: N/A;  . stent in leg    . TEMPORARY DIALYSIS CATHETER N/A 05/31/2019   Procedure:  TEMPORARY DIALYSIS CATHETER;  Surgeon: Algernon Huxley, MD;  Location: [facility];  Service: Cardiovascular;  Laterality: N/A;    OB History   No obstetric history on file.      Home Medications    Prior to Admission medications   Medication Sig Start Date End Date Taking? Authorizing Provider  Acetaminophen (TYLENOL ARTHRITIS PAIN PO) Take by mouth as needed.     [provider]  albuterol (VENTOLIN HFA) 108 (90 Base) MCG/ACT inhaler TAKE 2 PUFFS BY MOUTH EVERY 6 HOURS AS NEEDED FOR WHEEZE OR SHORTNESS OF BREATH 08/25/19   Chrismon, Vickki Muff, PA  allopurinol (ZYLOPRIM) 100 MG tablet Take 100 mg by mouth 2 (two) times daily.  11/09/18   [provider]  aspirin EC 81 MG tablet Take 81 mg by mouth daily.    [provider]  cephALEXin (KEFLEX) 500 MG capsule Take 1 capsule (500 mg total) by mouth 4 (four) times daily. 10/24/19   Chrismon, Vickki Muff, PA  colchicine 0.6 MG tablet Take 0.5 tablets (0.3 mg total) by mouth 3 (three) times a week. 06/07/19   Mayo, Pete Pelt, MD  doxycycline (VIBRAMYCIN) 100 MG capsule Take 1 capsule (100 mg total) by mouth 2 (two) times daily. 10/27/19   Sharion Balloon, NP  hydrocortisone valerate cream (WESTCORT) 0.2 % Apply 1 application topically as needed.  08/26/16   [provider]  Loratadine (CLARITIN PO) Take 10 mg by mouth daily.     [provider]  Multiple Vitamins-Minerals (ICAPS AREDS 2 PO) Take 1 tablet by mouth daily.    [provider]  OXYGEN Inhale 2 L into the lungs.    [provider]  pantoprazole (PROTONIX) 20 MG tablet Take 2 caps. Daily in am. Patient taking differently: Take by mouth. Take 2 caps. Daily in am. 04/04/19   Chrismon, Vickki Muff, PA  potassium chloride (MICRO-K) 10 MEQ CR capsule  08/09/19   [provider]  pravastatin (PRAVACHOL) 20 MG tablet TAKE 1 TABLET AT BEDTIME 10/12/19   Chrismon, Vickki Muff, PA  torsemide (DEMADEX) 20 MG tablet  08/13/19   [provider]  vitamin B-12 (CYANOCOBALAMIN) 100 MCG tablet Take 100 mcg by mouth daily.    [provider]    Family History Family History  Problem Relation Age of Onset  . Heart disease Mother   . Heart disease Father     Social History Social History   Tobacco Use  . Smoking status: Former Smoker    Types: Cigarettes  . Smokeless tobacco: Never Used  . Tobacco comment: quit >30-40 years ago  Substance Use Topics  . Alcohol use: No  . Drug use: No     Allergies   Pletal [cilostazol], Erythromycin, Codeine, Erythromycin base, Omnicef [cefdinir], Penicillins, and Sulfa antibiotics   Review of Systems Review of Systems  Constitutional: Negative for chills and fever.  HENT: Positive for congestion, ear pain, postnasal drip and sore throat.   Eyes: Negative for pain and  visual disturbance.  Respiratory: Positive for shortness of breath. Negative for cough.   Cardiovascular: Negative for chest pain and palpitations.  Gastrointestinal: Negative for abdominal pain, diarrhea, nausea and vomiting.  Genitourinary: Negative for dysuria and hematuria.  Musculoskeletal: Negative for arthralgias and back pain.  Skin: Negative for color change and rash.  Neurological: Negative for seizures and syncope.  All other systems reviewed and are negative.    Physical Exam Triage Vital Signs ED Triage Vitals [10/27/19 1447]  Enc Vitals Group     BP 138/77     Pulse Rate 88     Resp 18     Temp 98 F (36.7 C)     Temp src      SpO2 90 %     Weight      Height      Head Circumference      Peak Flow      Pain Score 10     Pain Loc      Pain Edu?      Excl. in [place]?    No data found.  Updated Vital Signs BP 138/77   Pulse 88   Temp 98 F (36.7 C)   Resp 18   SpO2 90%   Visual Acuity Right Eye Distance:   Left Eye Distance:   Bilateral Distance:    Right Eye Near:   Left Eye Near:    Bilateral Near:     Physical Exam Vitals signs and nursing note  reviewed.  Constitutional:      General: She is not in acute distress.    Appearance: She is well-developed.  HENT:     Head: Normocephalic and atraumatic.     Right Ear: Tympanic membrane normal.     Left Ear: Tympanic membrane normal.     Nose: Nose normal.     Mouth/Throat:     Mouth: Mucous membranes are moist.     Pharynx: Posterior oropharyngeal erythema present.     Comments: Clear mucus noted in posterior pharynx. Eyes:     Conjunctiva/sclera: Conjunctivae normal.  Neck:     Musculoskeletal: Neck supple.  Cardiovascular:     Rate and Rhythm: Normal rate and regular rhythm.     Heart sounds: No murmur.  Pulmonary:     Effort: Pulmonary effort is normal. No respiratory distress.     Breath sounds: Normal breath sounds.     Comments: Wearing O2 via [place].  Abdominal:     General: Bowel sounds are normal.     Palpations: Abdomen is soft.     Tenderness: There is no abdominal tenderness. There is no guarding or rebound.  Skin:    General: Skin is warm and dry.     Findings: No rash.  Neurological:     Mental Status: She is alert.      UC Treatments / Results  Labs (all labs ordered are listed, but only abnormal results are displayed) Labs Reviewed  NOVEL CORONAVIRUS, NAA  POCT RAPID STREP A (OFFICE)    EKG   Radiology No results found.  Procedures Procedures (including critical care time)  Medications Ordered in UC Medications - No data to display  Initial Impression / Assessment and Plan / UC Course  I have reviewed the triage vital signs and the nursing notes.  Pertinent labs & imaging results that were available during my care of the patient were reviewed by me and considered in my medical decision making (see chart for details).  Sore throat, sinusitis.  Treating with doxycycline.  Instructed patient and her daughter to follow-up with her PCP on Monday.  Instructed them to go to the emergency department if her symptoms worsen or she develops new  symptoms such as a fever.  They agree to this plan of care.     Final Clinical Impressions(s) / UC Diagnoses   Final diagnoses:  Sore throat  Acute non-recurrent sinusitis, unspecified location     Discharge Instructions     Take the antibiotic as directed.  Follow-up with your primary care provider on Monday.    Go to the emergency department if your symptoms get worse or you develop new symptoms such as fever.        ED Prescriptions    Medication Sig Dispense Auth. Provider   doxycycline (VIBRAMYCIN) 100 MG capsule Take 1 capsule (100 mg total) by mouth 2 (two) times daily. 20 capsule Sharion Balloon, NP     PDMP not reviewed this encounter.   Sharion Balloon, NP 10/27/19 1534

## 2019-10-27 NOTE — ED Triage Notes (Signed)
Pt presents with complaints of sore throat since Saturday. Pt denies any fevers at home. Pt is also complaining of otalgia and shortness of breath. She received dialysis twice last week and has been feeling weak ever since.

## 2019-10-27 NOTE — Discharge Instructions (Addendum)
Take the antibiotic as directed.  Follow-up with your primary care provider on Monday.    Go to the emergency department if your symptoms get worse or you develop new symptoms such as fever.

## 2019-10-27 NOTE — ED Notes (Signed)
Pt is on 2L [place] as needed. Pt is using it at this time.

## 2019-10-28 LAB — NOVEL CORONAVIRUS, NAA: SARS-CoV-2, NAA: NOT DETECTED

## 2019-10-30 LAB — CULTURE, GROUP A STREP (THRC)

## 2019-10-30 NOTE — Telephone Encounter (Signed)
From [facility] 

## 2019-10-30 NOTE — Telephone Encounter (Signed)
Agree she should be checked for strep. Presently on low dose prophylactic Keflex for vascular bypass graft infection in the past.

## 2019-11-02 DIAGNOSIS — R609 Edema, unspecified: Secondary | ICD-10-CM | POA: Diagnosis not present

## 2019-11-02 DIAGNOSIS — J811 Chronic pulmonary edema: Secondary | ICD-10-CM | POA: Diagnosis not present

## 2019-11-02 DIAGNOSIS — N185 Chronic kidney disease, stage 5: Secondary | ICD-10-CM | POA: Diagnosis not present

## 2019-11-03 DIAGNOSIS — N189 Chronic kidney disease, unspecified: Secondary | ICD-10-CM | POA: Diagnosis not present

## 2019-11-03 DIAGNOSIS — N179 Acute kidney failure, unspecified: Secondary | ICD-10-CM | POA: Diagnosis not present

## 2019-11-04 ENCOUNTER — Inpatient Hospital Stay
Admission: EM | Admit: 2019-11-04 | Discharge: 2019-12-01 | DRG: 871 | Disposition: E | Payer: Medicare PPO | Attending: Internal Medicine | Admitting: Internal Medicine

## 2019-11-04 ENCOUNTER — Encounter: Payer: Self-pay | Admitting: Emergency Medicine

## 2019-11-04 ENCOUNTER — Other Ambulatory Visit: Payer: Self-pay

## 2019-11-04 ENCOUNTER — Emergency Department: Payer: Medicare PPO

## 2019-11-04 DIAGNOSIS — Z992 Dependence on renal dialysis: Secondary | ICD-10-CM

## 2019-11-04 DIAGNOSIS — Z515 Encounter for palliative care: Secondary | ICD-10-CM | POA: Diagnosis not present

## 2019-11-04 DIAGNOSIS — N185 Chronic kidney disease, stage 5: Secondary | ICD-10-CM | POA: Diagnosis not present

## 2019-11-04 DIAGNOSIS — R6521 Severe sepsis with septic shock: Secondary | ICD-10-CM | POA: Diagnosis not present

## 2019-11-04 DIAGNOSIS — I739 Peripheral vascular disease, unspecified: Secondary | ICD-10-CM | POA: Diagnosis present

## 2019-11-04 DIAGNOSIS — J45991 Cough variant asthma: Secondary | ICD-10-CM | POA: Diagnosis present

## 2019-11-04 DIAGNOSIS — R000 Tachycardia, unspecified: Secondary | ICD-10-CM | POA: Diagnosis not present

## 2019-11-04 DIAGNOSIS — J440 Chronic obstructive pulmonary disease with acute lower respiratory infection: Secondary | ICD-10-CM | POA: Diagnosis present

## 2019-11-04 DIAGNOSIS — R0902 Hypoxemia: Secondary | ICD-10-CM | POA: Diagnosis not present

## 2019-11-04 DIAGNOSIS — L97929 Non-pressure chronic ulcer of unspecified part of left lower leg with unspecified severity: Secondary | ICD-10-CM | POA: Diagnosis present

## 2019-11-04 DIAGNOSIS — I5043 Acute on chronic combined systolic (congestive) and diastolic (congestive) heart failure: Secondary | ICD-10-CM | POA: Diagnosis present

## 2019-11-04 DIAGNOSIS — J189 Pneumonia, unspecified organism: Secondary | ICD-10-CM | POA: Diagnosis present

## 2019-11-04 DIAGNOSIS — Z20828 Contact with and (suspected) exposure to other viral communicable diseases: Secondary | ICD-10-CM | POA: Diagnosis not present

## 2019-11-04 DIAGNOSIS — M1A3720 Chronic gout due to renal impairment, left ankle and foot, without tophus (tophi): Secondary | ICD-10-CM | POA: Diagnosis present

## 2019-11-04 DIAGNOSIS — Z79899 Other long term (current) drug therapy: Secondary | ICD-10-CM

## 2019-11-04 DIAGNOSIS — R410 Disorientation, unspecified: Secondary | ICD-10-CM

## 2019-11-04 DIAGNOSIS — G9340 Encephalopathy, unspecified: Secondary | ICD-10-CM | POA: Diagnosis present

## 2019-11-04 DIAGNOSIS — Z955 Presence of coronary angioplasty implant and graft: Secondary | ICD-10-CM

## 2019-11-04 DIAGNOSIS — Z90710 Acquired absence of both cervix and uterus: Secondary | ICD-10-CM

## 2019-11-04 DIAGNOSIS — Z9981 Dependence on supplemental oxygen: Secondary | ICD-10-CM | POA: Diagnosis not present

## 2019-11-04 DIAGNOSIS — Z8249 Family history of ischemic heart disease and other diseases of the circulatory system: Secondary | ICD-10-CM

## 2019-11-04 DIAGNOSIS — R4182 Altered mental status, unspecified: Secondary | ICD-10-CM | POA: Diagnosis not present

## 2019-11-04 DIAGNOSIS — H3530 Unspecified macular degeneration: Secondary | ICD-10-CM | POA: Diagnosis present

## 2019-11-04 DIAGNOSIS — I10 Essential (primary) hypertension: Secondary | ICD-10-CM | POA: Diagnosis present

## 2019-11-04 DIAGNOSIS — Z888 Allergy status to other drugs, medicaments and biological substances status: Secondary | ICD-10-CM

## 2019-11-04 DIAGNOSIS — Z7982 Long term (current) use of aspirin: Secondary | ICD-10-CM

## 2019-11-04 DIAGNOSIS — E785 Hyperlipidemia, unspecified: Secondary | ICD-10-CM | POA: Diagnosis present

## 2019-11-04 DIAGNOSIS — J9621 Acute and chronic respiratory failure with hypoxia: Secondary | ICD-10-CM | POA: Diagnosis present

## 2019-11-04 DIAGNOSIS — Z885 Allergy status to narcotic agent status: Secondary | ICD-10-CM

## 2019-11-04 DIAGNOSIS — R531 Weakness: Secondary | ICD-10-CM

## 2019-11-04 DIAGNOSIS — I2510 Atherosclerotic heart disease of native coronary artery without angina pectoris: Secondary | ICD-10-CM | POA: Diagnosis present

## 2019-11-04 DIAGNOSIS — I350 Nonrheumatic aortic (valve) stenosis: Secondary | ICD-10-CM | POA: Diagnosis present

## 2019-11-04 DIAGNOSIS — A419 Sepsis, unspecified organism: Secondary | ICD-10-CM | POA: Diagnosis not present

## 2019-11-04 DIAGNOSIS — J810 Acute pulmonary edema: Secondary | ICD-10-CM | POA: Diagnosis not present

## 2019-11-04 DIAGNOSIS — I872 Venous insufficiency (chronic) (peripheral): Secondary | ICD-10-CM | POA: Diagnosis present

## 2019-11-04 DIAGNOSIS — N186 End stage renal disease: Secondary | ICD-10-CM | POA: Diagnosis not present

## 2019-11-04 DIAGNOSIS — B999 Unspecified infectious disease: Secondary | ICD-10-CM | POA: Diagnosis present

## 2019-11-04 DIAGNOSIS — Z87891 Personal history of nicotine dependence: Secondary | ICD-10-CM

## 2019-11-04 DIAGNOSIS — K449 Diaphragmatic hernia without obstruction or gangrene: Secondary | ICD-10-CM | POA: Diagnosis present

## 2019-11-04 DIAGNOSIS — J90 Pleural effusion, not elsewhere classified: Secondary | ICD-10-CM | POA: Diagnosis not present

## 2019-11-04 DIAGNOSIS — I132 Hypertensive heart and chronic kidney disease with heart failure and with stage 5 chronic kidney disease, or end stage renal disease: Secondary | ICD-10-CM | POA: Diagnosis present

## 2019-11-04 DIAGNOSIS — Z66 Do not resuscitate: Secondary | ICD-10-CM | POA: Diagnosis not present

## 2019-11-04 DIAGNOSIS — I959 Hypotension, unspecified: Secondary | ICD-10-CM | POA: Diagnosis not present

## 2019-11-04 DIAGNOSIS — K219 Gastro-esophageal reflux disease without esophagitis: Secondary | ICD-10-CM | POA: Diagnosis present

## 2019-11-04 DIAGNOSIS — Z880 Allergy status to penicillin: Secondary | ICD-10-CM

## 2019-11-04 DIAGNOSIS — L97919 Non-pressure chronic ulcer of unspecified part of right lower leg with unspecified severity: Secondary | ICD-10-CM | POA: Diagnosis present

## 2019-11-04 DIAGNOSIS — Z95820 Peripheral vascular angioplasty status with implants and grafts: Secondary | ICD-10-CM

## 2019-11-04 DIAGNOSIS — Z881 Allergy status to other antibiotic agents status: Secondary | ICD-10-CM

## 2019-11-04 DIAGNOSIS — Z882 Allergy status to sulfonamides status: Secondary | ICD-10-CM

## 2019-11-04 DIAGNOSIS — N189 Chronic kidney disease, unspecified: Secondary | ICD-10-CM | POA: Diagnosis present

## 2019-11-04 DIAGNOSIS — Z9049 Acquired absence of other specified parts of digestive tract: Secondary | ICD-10-CM

## 2019-11-04 DIAGNOSIS — Z86718 Personal history of other venous thrombosis and embolism: Secondary | ICD-10-CM

## 2019-11-04 LAB — URINALYSIS, COMPLETE (UACMP) WITH MICROSCOPIC
Bilirubin Urine: NEGATIVE
Glucose, UA: NEGATIVE mg/dL
Hgb urine dipstick: NEGATIVE
Ketones, ur: NEGATIVE mg/dL
Leukocytes,Ua: NEGATIVE
Nitrite: NEGATIVE
Protein, ur: >=300 mg/dL — AB
Specific Gravity, Urine: 1.018 (ref 1.005–1.030)
pH: 5 (ref 5.0–8.0)

## 2019-11-04 LAB — CBC WITH DIFFERENTIAL/PLATELET
Abs Immature Granulocytes: 0.1 10*3/uL — ABNORMAL HIGH (ref 0.00–0.07)
Basophils Absolute: 0 10*3/uL (ref 0.0–0.1)
Basophils Relative: 0 %
Eosinophils Absolute: 0 10*3/uL (ref 0.0–0.5)
Eosinophils Relative: 0 %
HCT: 32.6 % — ABNORMAL LOW (ref 36.0–46.0)
Hemoglobin: 11.1 g/dL — ABNORMAL LOW (ref 12.0–15.0)
Immature Granulocytes: 1 %
Lymphocytes Relative: 2 %
Lymphs Abs: 0.3 10*3/uL — ABNORMAL LOW (ref 0.7–4.0)
MCH: 33.6 pg (ref 26.0–34.0)
MCHC: 34 g/dL (ref 30.0–36.0)
MCV: 98.8 fL (ref 80.0–100.0)
Monocytes Absolute: 0.7 10*3/uL (ref 0.1–1.0)
Monocytes Relative: 5 %
Neutro Abs: 13.2 10*3/uL — ABNORMAL HIGH (ref 1.7–7.7)
Neutrophils Relative %: 92 %
Platelets: 139 10*3/uL — ABNORMAL LOW (ref 150–400)
RBC: 3.3 MIL/uL — ABNORMAL LOW (ref 3.87–5.11)
RDW: 18.9 % — ABNORMAL HIGH (ref 11.5–15.5)
WBC: 14.4 10*3/uL — ABNORMAL HIGH (ref 4.0–10.5)
nRBC: 0.6 % — ABNORMAL HIGH (ref 0.0–0.2)

## 2019-11-04 LAB — GLUCOSE, CAPILLARY
Glucose-Capillary: 26 mg/dL — CL (ref 70–99)
Glucose-Capillary: 129 mg/dL — ABNORMAL HIGH (ref 70–99)

## 2019-11-04 LAB — COMPREHENSIVE METABOLIC PANEL
ALT: 31 U/L (ref 0–44)
AST: 69 U/L — ABNORMAL HIGH (ref 15–41)
Albumin: 3.4 g/dL — ABNORMAL LOW (ref 3.5–5.0)
Alkaline Phosphatase: 89 U/L (ref 38–126)
Anion gap: 15 (ref 5–15)
BUN: 119 mg/dL — ABNORMAL HIGH (ref 8–23)
CO2: 20 mmol/L — ABNORMAL LOW (ref 22–32)
Calcium: 9.4 mg/dL (ref 8.9–10.3)
Chloride: 96 mmol/L — ABNORMAL LOW (ref 98–111)
Creatinine, Ser: 4.25 mg/dL — ABNORMAL HIGH (ref 0.44–1.00)
GFR calc Af Amer: 10 mL/min — ABNORMAL LOW (ref >60)
GFR calc non Af Amer: 9 mL/min — ABNORMAL LOW (ref >60)
Glucose, Bld: 81 mg/dL (ref 70–99)
Potassium: 4.9 mmol/L (ref 3.5–5.1)
Sodium: 131 mmol/L — ABNORMAL LOW (ref 135–145)
Total Bilirubin: 1.1 mg/dL (ref 0.3–1.2)
Total Protein: 5.8 g/dL — ABNORMAL LOW (ref 6.5–8.1)

## 2019-11-04 LAB — LACTIC ACID, PLASMA: Lactic Acid, Venous: 1.3 mmol/L (ref 0.5–1.9)

## 2019-11-04 LAB — PHOSPHORUS: Phosphorus: 6.6 mg/dL — ABNORMAL HIGH (ref 2.5–4.6)

## 2019-11-04 LAB — MAGNESIUM: Magnesium: 2 mg/dL (ref 1.7–2.4)

## 2019-11-04 LAB — PROCALCITONIN: Procalcitonin: 1.18 ng/mL

## 2019-11-04 LAB — BRAIN NATRIURETIC PEPTIDE: B Natriuretic Peptide: >4500 pg/mL — ABNORMAL HIGH (ref 0.0–100.0)

## 2019-11-04 LAB — TROPONIN I (HIGH SENSITIVITY): Troponin I (High Sensitivity): 251 ng/L (ref <18)

## 2019-11-04 LAB — SARS CORONAVIRUS 2 BY RT PCR (HOSPITAL ORDER, PERFORMED IN ~~LOC~~ HOSPITAL LAB): SARS Coronavirus 2: NEGATIVE

## 2019-11-04 MED ORDER — HEPARIN SODIUM (PORCINE) 5000 UNIT/ML IJ SOLN
5000.0000 [IU] | Freq: Three times a day (TID) | INTRAMUSCULAR | Status: DC
  Administered 2019-11-04: 5000 [IU] via SUBCUTANEOUS
  Filled 2019-11-04: qty 1

## 2019-11-04 MED ORDER — PANTOPRAZOLE SODIUM 40 MG PO TBEC: 40.0000 mg | DELAYED_RELEASE_TABLET | Freq: Two times a day (BID) | ORAL | Status: DC

## 2019-11-04 MED ORDER — SODIUM CHLORIDE 0.9 % IV SOLN: 500.0000 mg | INTRAVENOUS | Status: DC

## 2019-11-04 MED ORDER — SODIUM CHLORIDE 0.9 % IV SOLN
1.0000 g | Freq: Once | INTRAVENOUS | Status: AC
  Administered 2019-11-04: 1 g via INTRAVENOUS
  Filled 2019-11-04: qty 10

## 2019-11-04 MED ORDER — ASPIRIN EC 81 MG PO TBEC: 81.0000 mg | DELAYED_RELEASE_TABLET | Freq: Every day | ORAL | Status: DC

## 2019-11-04 MED ORDER — VITAMIN B-12 100 MCG PO TABS: 100.0000 ug | ORAL_TABLET | Freq: Every day | ORAL | Status: DC

## 2019-11-04 MED ORDER — SODIUM CHLORIDE 0.9 % IV SOLN
500.0000 mg | Freq: Once | INTRAVENOUS | Status: AC
  Administered 2019-11-04: 500 mg via INTRAVENOUS
  Filled 2019-11-04: qty 500

## 2019-11-04 MED ORDER — SODIUM CHLORIDE 0.9 % IV SOLN
1.0000 g | Freq: Once | INTRAVENOUS | Status: DC
  Filled 2019-11-04: qty 10

## 2019-11-04 MED ORDER — ACETAMINOPHEN 325 MG PO TABS: 650.0000 mg | ORAL_TABLET | Freq: Four times a day (QID) | ORAL | Status: DC | PRN

## 2019-11-04 MED ORDER — ACETAMINOPHEN 650 MG RE SUPP: 650.0000 mg | Freq: Four times a day (QID) | RECTAL | Status: DC | PRN

## 2019-11-04 MED ORDER — ONDANSETRON HCL 4 MG PO TABS: 4.0000 mg | ORAL_TABLET | Freq: Four times a day (QID) | ORAL | Status: DC | PRN

## 2019-11-04 MED ORDER — ONDANSETRON HCL 4 MG/2ML IJ SOLN: 4.0000 mg | Freq: Four times a day (QID) | INTRAMUSCULAR | Status: DC | PRN

## 2019-11-04 MED ORDER — FUROSEMIDE 10 MG/ML IJ SOLN
40.0000 mg | Freq: Once | INTRAMUSCULAR | Status: AC
  Administered 2019-11-04: 40 mg via INTRAVENOUS
  Filled 2019-11-04: qty 4

## 2019-11-04 MED ORDER — GUAIFENESIN 100 MG/5ML PO SOLN
5.0000 mL | ORAL | Status: DC | PRN
  Filled 2019-11-04: qty 5

## 2019-11-04 MED ORDER — DEXTROSE 50 % IV SOLN
INTRAVENOUS | Status: AC
  Administered 2019-11-04: 50 mL via INTRAVENOUS
  Filled 2019-11-04: qty 50

## 2019-11-04 MED ORDER — DEXTROSE 50 % IV SOLN
1.0000 | Freq: Once | INTRAVENOUS | Status: AC
  Administered 2019-11-04: 22:00:00 50 mL via INTRAVENOUS

## 2019-11-04 MED ORDER — PRAVASTATIN SODIUM 20 MG PO TABS: 20.0000 mg | ORAL_TABLET | Freq: Every day | ORAL | Status: DC

## 2019-11-04 MED ORDER — LEVOFLOXACIN IN D5W 750 MG/150ML IV SOLN: 750.0000 mg | Freq: Once | INTRAVENOUS | Status: DC

## 2019-11-04 MED ORDER — SODIUM CHLORIDE 0.9 % IV SOLN: 1.0000 g | INTRAVENOUS | Status: DC

## 2019-11-04 MED ORDER — NOREPINEPHRINE BITARTRATE 1 MG/ML IV SOLN
0.0000 ug/min | INTRAVENOUS | Status: DC
  Filled 2019-11-04: qty 4

## 2019-11-04 MED ORDER — LORATADINE 10 MG PO TABS: 10.0000 mg | ORAL_TABLET | Freq: Every day | ORAL | Status: DC

## 2019-11-04 MED ORDER — IPRATROPIUM-ALBUTEROL 0.5-2.5 (3) MG/3ML IN SOLN
3.0000 mL | Freq: Four times a day (QID) | RESPIRATORY_TRACT | Status: DC
  Administered 2019-11-05: 3 mL via RESPIRATORY_TRACT
  Filled 2019-11-04: qty 3

## 2019-11-04 MED ORDER — DOPAMINE-DEXTROSE 3.2-5 MG/ML-% IV SOLN
0.0000 ug/kg/min | INTRAVENOUS | Status: DC
  Administered 2019-11-04: 5 ug/kg/min via INTRAVENOUS
  Filled 2019-11-04: qty 250

## 2019-11-04 MED ORDER — CALCIUM GLUCONATE-NACL 1-0.675 GM/50ML-% IV SOLN
1.0000 g | Freq: Once | INTRAVENOUS | Status: AC
  Administered 2019-11-04: 1000 mg via INTRAVENOUS
  Filled 2019-11-04: qty 50

## 2019-11-04 NOTE — ED Notes (Signed)
Daughter wants pt sit up more, pt repositioned in bed and up in a sitting position with pills to prop up

## 2019-11-04 NOTE — Progress Notes (Signed)
PHARMACY -  BRIEF ANTIBIOTIC NOTE   Pharmacy has received consult(s) for Levaquin  from an ED provider.  The patient's profile has been reviewed for ht/wt/allergies/indication/available labs.    One time order(s) placed for Azithromycin, Ceftriaxone ;  Levaquin d/c'd - pt has had both azithromycin and ceftriaxone before  Further antibiotics/pharmacy consults should be ordered by admitting physician if indicated.                       Thank you, Shandora Koogler D 11/03/2019  4:00 PM

## 2019-11-04 NOTE — ED Provider Notes (Addendum)
[facility] Emergency Department Provider Note  ____________________________________________   First MD Initiated Contact with Patient 11/03/2019 1451     (approximate)  I have reviewed the triage vital signs and the nursing notes.  History  Chief Complaint Weakness and Altered Mental Status    HPI Lori Johnson is a 85 y.o. female with history of CKD transitioning to ESRD, on intermittent/PRN dialysis (last dialyzed 12/4), COPD, HF, aortic stenosis, on baseline 2 L [place] who presents for confusion, generalized weakness.   Daughter at bedside reports decline over the last several days. States the patient is "talking nonsense" whereas at baseline she is normally alert and oriented. Patient last dialyzed yesterday for 3 hours. She has been on doxycyline for sinus infection (complained of nasal drainage/sore throat, seen at urgent care and Rx'd the antibiotics), but otherwise daughter has not noticed any fevers, cough, vomiting, diarrhea.   No sick contacts.   Caveat: hx primarily obtained from daughter due to her confusion. Supplemented by chart review.   Daughter states the patient is DNR.   Past Medical Hx Past Medical History:  Diagnosis Date  . Angiopathy, peripheral ([place])   . Arterial embolus and thrombosis ([place])   . ASCVD (arteriosclerotic cardiovascular disease)    with stent RCA  . Bilateral bunions   . Cancer ([place])    SCC removed off left leg  . Cervical lymphadenitis   . Chronic gout of left ankle due to renal impairment without tophus   . Chronic infection June 2014   Has chronic infection of right femoral graft with history of extensive debridement in June 2014. Was on oral Clindamycin for 3 weeks for culture positive for anerobes and staph lugdunensis. Vascular Surgeon at [facility] Remonia Richter, MD) recommended indefinite suppressive therapy with Cephalexin 500 mg qd and reassessment every 3 months. Will refill Cephalexin.)  . Chronic kidney disease    . COPD (chronic obstructive pulmonary disease) ([place])   . CRF (chronic renal failure)   . GERD (gastroesophageal reflux disease)   . Hiatal hernia   . History of arterial bypass of lower extremity   . Hyperlipidemia   . Hypertension   . Hyperuricemia   . Macular degeneration   . Mass of right side of neck   . Nocturia   . OA (osteoarthritis)   . PVD (peripheral vascular disease) ([place])   . Renal disorder     Problem List Patient Active Problem List   Diagnosis Date Noted  . Acute CHF (congestive heart failure) ([place]) 05/09/2019  . Acute respiratory failure ([place]) 05/08/2019  . GI bleed 10/16/2017  . Closed fracture of tibial plateau 06/24/2015  . Current tear knee, medial meniscus 06/24/2015  . Arthritis of knee, degenerative 06/24/2015  . Angiopathy, peripheral ([place]) 06/06/2015  . Arterial vascular disease 06/06/2015  . Bunion 06/06/2015  . Cervical adenitis 06/06/2015  . Gout due to renal impairment of multiple sites 06/06/2015  . Chronic infection 06/06/2015  . Contusion of forearm 06/06/2015  . Arm bruise 06/06/2015  . Asthma, cough variant 06/06/2015  . Gastroesophageal reflux disease with hiatal hernia 06/06/2015  . History of DVT of lower extremity 06/06/2015  . Bergmann's syndrome 06/06/2015  . H/O arterial bypass of lower limb 06/06/2015  . HLD (hyperlipidemia) 06/06/2015  . Benign hypertension 06/06/2015  . Elevated blood uric acid level 06/06/2015  . Disorder of kidney 06/06/2015  . Lump in neck 06/06/2015  . Excessive urination at night 06/06/2015  . Arthritis, degenerative 06/06/2015  . Poor balance  06/06/2015  . Peripheral blood vessel disorder ([place]) 06/06/2015  . Acid reflux 03/08/2013  . Atherosclerosis of native artery of extremity ([place]) 01/22/2013  . Chronic kidney disease 01/22/2013  . CAD in native artery 01/22/2013  . Essential (primary) hypertension 01/22/2013  . Atherosclerosis of native arteries of extremity with rest pain ([place]) 01/22/2013  .  Arthralgia of hip or thigh 03/29/2006    Past Surgical Hx Past Surgical History:  Procedure Laterality Date  . ABDOMINAL HYSTERECTOMY  1981  . arterial bypass right leg Right   . broken left foot Left 01/2002  . CHOLECYSTECTOMY    . CORONARY ANGIOPLASTY WITH STENT PLACEMENT    . DIALYSIS/PERMA CATHETER INSERTION N/A 06/01/2019   Procedure: DIALYSIS/PERMA CATHETER INSERTION;  Surgeon: Algernon Huxley, MD;  Location: [place];  Service: Cardiovascular;  Laterality: N/A;  . stent in leg    . TEMPORARY DIALYSIS CATHETER N/A 05/31/2019   Procedure: TEMPORARY DIALYSIS CATHETER;  Surgeon: Algernon Huxley, MD;  Location: [place] [facility];  Service: Cardiovascular;  Laterality: N/A;    Medications Prior to Admission medications   Medication Sig Start Date End Date Taking? Authorizing Provider  Acetaminophen (TYLENOL ARTHRITIS PAIN PO) Take by mouth as needed.     [provider]  albuterol (VENTOLIN HFA) 108 (90 Base) MCG/ACT inhaler TAKE 2 PUFFS BY MOUTH EVERY 6 HOURS AS NEEDED FOR WHEEZE OR SHORTNESS OF BREATH 08/25/19   Chrismon, Vickki Muff, PA  allopurinol (ZYLOPRIM) 100 MG tablet Take 100 mg by mouth 2 (two) times daily.  11/09/18   [provider]  aspirin EC 81 MG tablet Take 81 mg by mouth daily.    [provider]  cephALEXin (KEFLEX) 500 MG capsule Take 1 capsule (500 mg total) by mouth 4 (four) times daily. 10/24/19   Chrismon, Vickki Muff, PA  colchicine 0.6 MG tablet Take 0.5 tablets (0.3 mg total) by mouth 3 (three) times a week. 06/07/19   Mayo, Pete Pelt, MD  doxycycline (VIBRAMYCIN) 100 MG capsule Take 1 capsule (100 mg total) by mouth 2 (two) times daily. 10/27/19   Sharion Balloon, NP  hydrocortisone valerate cream (WESTCORT) 0.2 % Apply 1 application topically as needed.  08/26/16   [provider]  Loratadine (CLARITIN PO) Take 10 mg by mouth daily.     [provider]  Multiple Vitamins-Minerals (ICAPS AREDS 2 PO) Take 1 tablet by  mouth daily.    [provider]  OXYGEN Inhale 2 L into the lungs.    [provider]  pantoprazole (PROTONIX) 20 MG tablet Take 2 caps. Daily in am. Patient taking differently: Take by mouth. Take 2 caps. Daily in am. 04/04/19   Chrismon, Vickki Muff, PA  potassium chloride (MICRO-K) 10 MEQ CR capsule  08/09/19   [provider]  pravastatin (PRAVACHOL) 20 MG tablet TAKE 1 TABLET AT BEDTIME 10/12/19   Chrismon, Vickki Muff, PA  torsemide (DEMADEX) 20 MG tablet  08/13/19   [provider]  vitamin B-12 (CYANOCOBALAMIN) 100 MCG tablet Take 100 mcg by mouth daily.    [provider]    Allergies Pletal [cilostazol], Erythromycin, Codeine, Erythromycin base, Omnicef [cefdinir], Penicillins, and Sulfa antibiotics  Family Hx Family History  Problem Relation Age of Onset  . Heart disease Mother   . Heart disease Father     Social Hx Social History   Tobacco Use  . Smoking status: Former Smoker    Types: Cigarettes  . Smokeless tobacco: Never Used  .  Tobacco comment: quit >30-40 years ago  Substance Use Topics  . Alcohol use: No  . Drug use: No     Review of Systems Unable to obtain due to AMS  Physical Exam  Vital Signs: ED Triage Vitals  Enc Vitals Group     BP 11/14/2019 1428 (!) 95/48     Pulse Rate 11/19/2019 1428 68     Resp 11/22/2019 1428 17     Temp --      Temp src --      SpO2 11/24/2019 1423 95 %     Weight 11/03/2019 1422 156 lb 8.4 oz (71 kg)     Height 11/03/2019 1422 5\' 2"  (1.575 m)     Head Circumference --      Peak Flow --      Pain Score 11/10/2019 1422 0     Pain Loc --      Pain Edu? --      Excl. in [place]? --     Constitutional: Awake. Responds to voice. Follows commands.  Head: Normocephalic. Atraumatic. Eyes: Conjunctivae clear. Sclera anicteric. Nose: No congestion.  Mouth/Throat: Wearing mask. MM seem dry.  Neck: No stridor.   Cardiovascular: Normal rate, regular rhythm. Extremities well perfused. Respiratory:  Coarse lung sounds. Decreased at R base. On baseline [place]. Gastrointestinal: Soft. Non-tender. Non-distended.  Musculoskeletal: BLE edema to mid shin/knees. Neurologic:  No gross focal neurologic deficits are appreciated. Moving all extremities.  Psychiatric: Mood and affect are appropriate for situation.  EKG  Telemetry monitor with concern for possible short, unsustained wide complex tachycardia vs artifact. EKG ordered.    Radiology  CXR: IMPRESSION:  1. Moderate to large bilateral pleural effusions, right greater than left.  2. Right midlung opacity may represent pneumonia.    Procedures  Procedure(s) performed (including critical care):  .Critical Care Performed by: Lilia Pro., MD Authorized by: Lilia Pro., MD   Critical care provider statement:    Critical care time (minutes):  35   Critical care was time spent personally by me on the following activities:  Discussions with consultants, evaluation of patient's response to treatment, examination of patient, ordering and performing treatments and interventions, ordering and review of laboratory studies, ordering and review of radiographic studies, pulse oximetry, re-evaluation of patient's condition, obtaining history from patient or surrogate and review of old charts     Initial Impression / Assessment and Plan / ED Course  85 y.o. female who presents to the ED for confusion, generalized decline, as above.   Ddx: electrolyte abnormality, pulmonary infection vs other infection, volume overload  Telemetry monitor with concern for possible short, unsustained wide complex tachycardia vs artifact. EKG ordered. Ordered calcium empirically in case of hyperkalemia given her renal status while awaiting labs.   XR with pleural effusions, R sided PNA. Leukocytosis on CBC.   Will give IV antibiotics. Holding on aggressive fluid resuscitation at this time given her ESRD status and questionable volume overload.    Awaiting remainder of labs, anticipate admission. Care transferred to oncoming MD due to shift change.    Final Clinical Impression(s) / ED Diagnosis  Final diagnoses:  Generalized weakness  Confusion       Note:  This document was prepared using Dragon voice recognition software and may include unintentional dictation errors.     Lilia Pro., MD 11/20/2019 [phone]

## 2019-11-04 NOTE — ED Triage Notes (Signed)
Pt has hx of pneumonia in may and became fluid overloaded and per ems they have been having a hard time with fluid overload since. They stopped dialysis at some point as well. Call today was change in mental status, hypotensive and rhoni

## 2019-11-04 NOTE — Progress Notes (Signed)
CODE SEPSIS - PHARMACY COMMUNICATION  **Broad Spectrum Antibiotics should be administered within 1 hour of Sepsis diagnosis**  Time Code Sepsis Called/Page Received:   12/5 @ 1850   Antibiotics Ordered:  Ceftriaxone, Azithromycin   Time of 1st antibiotic administration:   Azithromycin, Ceftriaxone   Additional action taken by pharmacy:     If necessary, Name of Provider/Nurse Contacted:     Ralston Venus D ,PharmD Clinical Pharmacist  11/29/2019  6:49 PM

## 2019-11-04 NOTE — H&P (Addendum)
History and Physical    Lori Johnson BZJ:[id number] DOB: [birth year] DOA: 11/27/2019  PCP: Margo Common, PA  Patient coming from: Home  I have personally briefly reviewed patient's old medical records in [name]  Chief Complaint: generalized weakness, lethargic  HPI: Lori Johnson is a 85 y.o. female with medical history significant of chronic kidney disease approaching ESRD on intermittent dialysis (last dialyzed yesterday, 12/4), COPD, chronic mixed CHF (echo from June 2020 EF 50 to 55% with impaired relaxation aortic stenosis), aortic stenosis, peripheral vascular disease, gout who presented to the ED today due to altered mental status and generalized weakness.  History is provided by patient's daughter at bedside due to patient somnolent and minimally arousable.  She reports that patient had dialysis on 2 consecutive days before Thanksgiving, and appeared to feel weak and fatigued.  She then developed a sore throat, had some pain with swallowing but no cough or congestion, no fevers or chills.  She was seen at urgent care on Friday and prescribed doxycycline for sinus infection.  Since that time patient has become progressively weak, sleeping more than normal, confused, agitated at times, and with poor p.o. intake.  Patient did complain at home about being very hot however no actual fevers.  She has not had any abdominal, nausea or vomiting.  Did have some intermittent diarrhea after starting doxy.  Family have not noticed any aspiration with swallowing.  Daughter reports the patient has been on supplemental oxygen since May or June when she was apparently admitted for pneumonia, was using it only with ambulation and during sleep, but has needed it continuously for the past week.  ED Course: Temp 96 F, tachypneic, hypotensive and requiring 3 L/min oxygen.  Labs notable for sodium 131, chloride 96, bicarb 20, BUN 119, creatinine 4.25, phosphorus 6.6, albumin 3.4, total bili 1.1 normal,  BNPgreater than 4500, troponin 251, lactic normal at 1.3, pro-Cal normal at 1.18, leukocytosis 14.4 (neutrophil predominant, leukopenic), hemoglobin 11.1.  UA was normal.  Group A strep negative.  COVID NEGATIVE.  Chest x-ray showed bilateral pleural effusions, right greater than left and opacity in the right midlung concerning for pneumonia.  Patient was started on Rocephin and azithromycin in the ED.  ED provider discussed case with nephrology Dr. Abigail Butts, they will follow.  Admitted to stepdown unit with PCCM consulted.  Regarding CODE STATUS: Patient has no designated POA, her adult children collectively make decisions as her surrogate.  Daughter had spoken with her siblings prior to my encounter and they had come to agreement that patient should be full code for now.  Patient has previously been DNR, however per her daughter, has since stated that she would want temporary intubation if it would help her recover from respiratory problem, and would want chest compressions and defibrillation if she were to have a cardiac arrest.  Patient also stated to her family that she would not want to be "kept alive" on a ventilator if she had no chance of meaningful recovery.  Review of Systems: As per HPI otherwise 10 point review of systems negative.    Past Medical History:  Diagnosis Date  . Angiopathy, peripheral ([place])   . Arterial embolus and thrombosis ([place])   . ASCVD (arteriosclerotic cardiovascular disease)    with stent RCA  . Bilateral bunions   . Cancer ([place])    SCC removed off left leg  . Cervical lymphadenitis   . Chronic gout of left ankle due to renal impairment without tophus   .  Chronic infection June 2014   Has chronic infection of right femoral graft with history of extensive debridement in June 2014. Was on oral Clindamycin for 3 weeks for culture positive for anerobes and staph lugdunensis. Vascular Surgeon at [facility] Remonia Richter, MD) recommended indefinite suppressive therapy with  Cephalexin 500 mg qd and reassessment every 3 months. Will refill Cephalexin.)  . Chronic kidney disease   . COPD (chronic obstructive pulmonary disease) ([place])   . CRF (chronic renal failure)   . GERD (gastroesophageal reflux disease)   . Hiatal hernia   . History of arterial bypass of lower extremity   . Hyperlipidemia   . Hypertension   . Hyperuricemia   . Macular degeneration   . Mass of right side of neck   . Nocturia   . OA (osteoarthritis)   . PVD (peripheral vascular disease) ([place])   . Renal disorder     Past Surgical History:  Procedure Laterality Date  . ABDOMINAL HYSTERECTOMY  1981  . arterial bypass right leg Right   . broken left foot Left 01/2002  . CHOLECYSTECTOMY    . CORONARY ANGIOPLASTY WITH STENT PLACEMENT    . DIALYSIS/PERMA CATHETER INSERTION N/A 06/01/2019   Procedure: DIALYSIS/PERMA CATHETER INSERTION;  Surgeon: Algernon Huxley, MD;  Location: [facility];  Service: Cardiovascular;  Laterality: N/A;  . stent in leg    . TEMPORARY DIALYSIS CATHETER N/A 05/31/2019   Procedure: TEMPORARY DIALYSIS CATHETER;  Surgeon: Algernon Huxley, MD;  Location: [facility];  Service: Cardiovascular;  Laterality: N/A;     reports that she has quit smoking. Her smoking use included cigarettes. She has never used smokeless tobacco. She reports that she does not drink alcohol or use drugs.  Allergies  Allergen Reactions  . Pletal [Cilostazol] Swelling  . Erythromycin Hives  . Codeine Nausea Only and Nausea And Vomiting  . Erythromycin Base Itching and Rash  . Omnicef [Cefdinir] Itching and Rash  . Penicillins Hives, Itching and Rash  . Sulfa Antibiotics Rash    Family History  Problem Relation Age of Onset  . Heart disease Mother   . Heart disease Father     Prior to Admission medications   Medication Sig Start Date End Date Taking? Authorizing Provider  aspirin EC 81 MG tablet Take 81 mg by mouth daily.   Yes [provider]  doxycycline  (VIBRAMYCIN) 100 MG capsule Take 1 capsule (100 mg total) by mouth 2 (two) times daily. 10/27/19  Yes Sharion Balloon, NP  furosemide (LASIX) 20 MG tablet Take 60 mg by mouth 2 (two) times daily. 08/22/19 08/21/20 Yes [provider]  Loratadine (CLARITIN PO) Take 10 mg by mouth daily.    Yes [provider]  Multiple Vitamins-Minerals (ICAPS AREDS 2 PO) Take 1 tablet by mouth daily.   Yes [provider]  pantoprazole (PROTONIX) 20 MG tablet Take 2 caps. Daily in am. Patient taking differently: Take 40 mg by mouth 2 (two) times daily. Take 2 caps. Daily in am. 04/04/19  Yes Chrismon, Vickki Muff, PA  potassium chloride (MICRO-K) 10 MEQ CR capsule Take 10 mEq by mouth daily.  08/09/19  Yes [provider]  vitamin B-12 (CYANOCOBALAMIN) 100 MCG tablet Take 100 mcg by mouth daily.   Yes [provider]  Acetaminophen (TYLENOL ARTHRITIS PAIN PO) Take by mouth as needed.     [provider]  albuterol (VENTOLIN HFA) 108 (90 Base) MCG/ACT inhaler TAKE 2 PUFFS BY MOUTH EVERY  6 HOURS AS NEEDED FOR WHEEZE OR SHORTNESS OF BREATH 08/25/19   Chrismon, Vickki Muff, PA  allopurinol (ZYLOPRIM) 100 MG tablet Take 100 mg by mouth 2 (two) times daily.  11/09/18   [provider]  cephALEXin (KEFLEX) 500 MG capsule Take 1 capsule (500 mg total) by mouth 4 (four) times daily. Patient not taking: Reported on 11/21/2019 10/24/19   Chrismon, Vickki Muff, PA  colchicine 0.6 MG tablet Take 0.5 tablets (0.3 mg total) by mouth 3 (three) times a week. 06/07/19   Mayo, Pete Pelt, MD  hydrocortisone valerate cream (WESTCORT) 0.2 % Apply 1 application topically as needed.  08/26/16   [provider]  OXYGEN Inhale 2 L into the lungs.    [provider]  pravastatin (PRAVACHOL) 20 MG tablet TAKE 1 TABLET AT BEDTIME Patient taking differently: Take 20 mg by mouth daily.  10/12/19   ChrismonVickki Muff, PA    Physical Exam: Vitals:   11/24/2019 1654 11/07/2019 1700 11/20/2019  1735 11/03/2019 1800  BP: (!) 99/53 (!) 101/52  (!) 87/50  Pulse: 65     Resp: (!) 24 (!) 23  (!) 27  Temp:   (!) 96 F (35.6 C)   TempSrc:   Axillary   SpO2: 98%     Weight:      Height:        Vitals:   11/23/2019 1654 11/13/2019 1700 11/30/2019 1735 11/09/2019 1800  BP: (!) 99/53 (!) 101/52  (!) 87/50  Pulse: 65     Resp: (!) 24 (!) 23  (!) 27  Temp:   (!) 96 F (35.6 C)   TempSrc:   Axillary   SpO2: 98%     Weight:      Height:        Constitutional: Somnolent but arousable, wincing and groaning intermittently, appears in moderate distress Eyes: Closed, lids and periorbital areas normal ENMT: Dry mucous membranes.  Oropharynx appears normal. Neck: normal, supple, no masses, no thyromegaly Respiratory: Decreased lung sounds at the bases, inspiratory and expiratory wheezing present left more than right, mildly increased work of breathing with abdominal and supraclavicular retractions on 3 L/min oxygen [place]. Cardiovascular: Regular rate and rhythm, 2/6 systolic murmur.  JVD present.  Bilateral lower extremities and Ace wraps for weeping edema, weak radial pulses, unable to palpate dorsalis pedis pulses bilaterally.  Right upper chest wall with dialysis catheter in place. Abdomen: no tenderness, no distention. Bowel sounds positive.  Musculoskeletal: cyanotic fingers bilaterally left more than right, joint hypertrophy bilateral hands, moves all Skin: Hands cold to touch, skin cool and dry Neurologic: Unable to fully evaluate due to patient's somnolence.  Does alert to voice and tactile stimulation.  Follows commands. Psychiatric: Unable to evaluate at this time   Labs on Admission: I have personally reviewed following labs and imaging studies  CBC: Recent Labs  Lab 11/03/2019 1451  WBC 14.4*  NEUTROABS 13.2*  HGB 11.1*  HCT 32.6*  MCV 98.8  PLT 916*   Basic Metabolic Panel: Recent Labs  Lab 11/21/2019 1635  NA 131*  K 4.9  CL 96*  CO2 20*  GLUCOSE 81  BUN 119*  CREATININE  4.25*  CALCIUM 9.4  MG 2.0  PHOS 6.6*   GFR: Estimated Creatinine Clearance: 8.1 mL/min (A) (by C-G formula based on SCr of 4.25 mg/dL (H)). Liver Function Tests: Recent Labs  Lab 11/10/2019 1635  AST 69*  ALT 31  ALKPHOS 89  BILITOT 1.1  PROT 5.8*  ALBUMIN 3.4*  No results for input(s): LIPASE, AMYLASE in the last 168 hours. No results for input(s): AMMONIA in the last 168 hours. Coagulation Profile: No results for input(s): INR, PROTIME in the last 168 hours. Cardiac Enzymes: No results for input(s): CKTOTAL, CKMB, CKMBINDEX, TROPONINI in the last 168 hours. BNP (last 3 results) No results for input(s): PROBNP in the last 8760 hours. HbA1C: No results for input(s): HGBA1C in the last 72 hours. CBG: No results for input(s): GLUCAP in the last 168 hours. Lipid Profile: No results for input(s): CHOL, HDL, LDLCALC, TRIG, CHOLHDL, LDLDIRECT in the last 72 hours. Thyroid Function Tests: No results for input(s): TSH, T4TOTAL, FREET4, T3FREE, THYROIDAB in the last 72 hours. Anemia Panel: No results for input(s): VITAMINB12, FOLATE, FERRITIN, TIBC, IRON, RETICCTPCT in the last 72 hours. Urine analysis:    Component Value Date/Time   COLORURINE AMBER (A) 11/24/2019 1548   APPEARANCEUR CLOUDY (A) 11/12/2019 1548   APPEARANCEUR Clear 10/27/2013 1403   LABSPEC 1.018 11/29/2019 1548   LABSPEC 1.009 10/27/2013 1403   PHURINE 5.0 11/17/2019 [place] 11/19/2019 1548   GLUCOSEU Negative 10/27/2013 1403   HGBUR NEGATIVE 11/10/2019 [place] 11/10/2019 1548   BILIRUBINUR Negative 10/27/2013 1403   KETONESUR NEGATIVE 11/29/2019 1548   PROTEINUR >=300 (A) 11/19/2019 1548   NITRITE NEGATIVE 11/22/2019 1548   LEUKOCYTESUR NEGATIVE 11/13/2019 1548   LEUKOCYTESUR Negative 10/27/2013 1403    Radiological Exams on Admission: Dg Chest Port 1 View  Result Date: 11/17/2019 CLINICAL DATA:  Altered mental status. EXAM: PORTABLE CHEST 1 VIEW COMPARISON:   06/05/2019 . FINDINGS: right sided dialysis catheter is identified. Cardiac enlargement. Aortic atherosclerosis. Moderate to large bilateral pleural effusions are identified, right greater than left. Opacity within the right midlung is noted. Pulmonary vascular congestion noted. IMPRESSION: 1. Moderate to large bilateral pleural effusions, right greater than left. 2. Right midlung opacity may represent pneumonia. Electronically Signed   By: Kerby Moors M.D.   On: 11/04/2019 15:25    EKG: Independently reviewed.   Assessment/Plan Principal Problem:   Sepsis due to pneumonia [facility]) Active Problems:   Acute on chronic respiratory failure with hypoxia (HCC)   Essential (primary) hypertension   Asthma, cough variant   Chronic infection   Gastroesophageal reflux disease with hiatal hernia   Chronic kidney disease  Sepsis due to Community-Acquired Pneumonia Acute on Chronic Respiratory Failure with Hypoxia Present on admission.  As evidenced by acute on chronic hypoxic respiratory failure, hypotension, leukocytosis, hypothermia and probably right lung infiltrate on CXR.  Started on Rocephin/Zithromax in ED.  Lactate and procal normal. --admit to step down unit --PCCM consulted, case discussed with Dr. Loni Muse. --continue Rocephin and Zithromax --Levophed, titrate for MAP > 65 --no fluids, patient with b/l pleural effusions, CKD on dialysis, very prone to volume overload --maintain O2 sat > 92% --consider Bipap if increased work of breathing persists --Duonebs q6h --Albuterol nebs q3h PRN --Guaifenesin --Retail banker as needed, maintain temp > 96.56F  Chronic Kidney Disease, stage V, not yet on chronic dialysis On intermittent hemodialysis at this point.  Per daughter, plan to initiate peritoneal dialysis at home.  Follows with Dr. Candiss Norse.  Creatinine 4.25 with GFR of 9 on admission. -Nephrology following  Hypotension, present on admission History of hypertension -no longer on medication  -Patient volume overloaded, avoid IV fluids -Pressor support as above  Elevated troponin Clinically ACS seems unlikely, patient without chest pain.  Does have significant renal failure and likely demand ischemia in the  setting of hypoxia and hypotension due to sepsis. -Stat EKG if chest pain -will trend troponin --Echo in AM to see if any WMA's  Elevated D-dimer Consistent with hypoxia and renal failure.  Lower suspicion for PE at this time but will monitor closely.  Patient not tachycardic hypoxia likely due to pneumonia and pleural effusions. -Will defer CTA chest for now unless patient develops tachycardia, worsening hypoxia and hypotension, or does not respond to antibiotics. --Echo in AM   Cough variant asthma -Nebulizers as above  Peripheral vascular disease -Continue aspirin -No longer on antiplatelet -Is on chronic Keflex for chronic infection of lower extremity stent per daughter.  This should be continued on discharge.  Seasonal allergies -Continue loratadine   DVT prophylaxis: Heparin  Code Status: Full Family Communication: Daughter, [name] at bedside on admission. Disposition Plan: Pending clinical course Consults called: Nephrology, Dr. Abigail Butts notified by ED PCCM, Dr. Lanney Gins Admission status: Inpatient  Inpatient status:   Patient requires inpatient status due to severity of illness and intensity of care required for management.  She is at very high risk for further deterioration and mortality, will require care in stepdown unit for close monitoring.  It is my clinical judgment that the patient will require inpatient hospital care spanning beyond 2 midnights from the point of admission.    This patient has multiple chronic comorbidities including  end-stage renal disease, CHF, vascular disease, who presents critically ill with sepsis secondary to pneumonia, with hypoxia and hypotension requiring pressor support and hypothermia.    Current medical needs:  please see above  assessment and plan    Ezekiel Slocumb, DO [facility] Pager [phone]  If 7PM-7AM, please contact night-coverage [email or web address] Password [facility]  11/06/2019, 6:55 PM

## 2019-11-04 NOTE — ED Triage Notes (Signed)
Pt arrives via ems from home, pt's family called out due to increased weakness and altered mental status. Pt's hx is that she was diagnosed with pneumonia in may 2020 and was placed on dialysis temporarily due to fluid over load, attempt being made by care providers to wean pt off of dialysis, pt is always on 3L of O2 [place].

## 2019-11-04 NOTE — ED Provider Notes (Signed)
Patient continues to appear fairly critically ill, but does alert to voice.  Opens eyes, does interact purposefully with daughter.  She does appear cyanotic in her extremities however, does also appear volume overloaded.  She does not also seem somewhat somnolent.  Chest x-ray concerning with possible infiltrate as well as significant bilateral pleural effusions.  I discussed the case with nephrology who is going to see and evaluate and assist with further dialysis and volume management needs and recommendations.  Antibiotics initiated.  Discussed with the patient's daughter who is at the bedside reports the patient does not have a defined healthcare power of attorney but family collectively makes medical decisions, she is unclear as to [place].  Reports the patient may want to be on a ventilator and may want her heart shocked if it would be a temporary measure, and family still working to clarify [place].  At this point I will leave her full code as there is some ambiguity  Admitting to hospitalist service for further management.  Daughter reports patient had dialysis yesterday, sees Dr. Candiss Norse as well  Troponin is elevated, somewhat expectedly however given the patient is ESRD.  Leukocytosis, elevated creatinine, for the labs also reviewed     Delman Kitten, MD 11/04/2019 1733

## 2019-11-04 NOTE — ED Notes (Signed)
Attempted report =- icu will call back

## 2019-11-04 NOTE — Consult Note (Signed)
PULMONARY / CRITICAL CARE MEDICINE  Name: Lori Johnson MRN: [id number] DOB: 1930/01/23    LOS: 0  Referring Provider: Dr. Arbutus Ped Reason for Referral: Acute hypoxic respiratory failure secondary to volume overload  Brief patient description: 85 year old female admitted with acute hypoxic respiratory failure secondary to volume overload, bilateral pleural effusions, bilateral pneumonia, sepsis, septic shock and end-stage renal disease requiring dialysis  HPI: This is an 85 year old female with a medical history as indicated below, end-stage renal disease on intermittent dialysis who presented to the ED with altered mental status, and generalized weakness.  History is obtained from ED records and from family as patient is currently confused and unable to provide a history.  Per patient's family, she has experienced a progressive decline since May when she developed pneumonia and was hospitalized.  She ended up with renal failure and her kidney function has progressively declined to the point where she is now requiring dialysis twice a week.  Over the course of the week, patient has not been her usual self, she is more confused and unable to perform any ADLs or ambulate.  She had dialysis on 11/03/2019.  She has had multiple doses of antibiotics but overall nothing seems to get her back to her baseline. At the ED, her work-up was significant for bilateral pleural effusions on chest x-ray, right greater than left, bilateral pneumonia, elevated troponin, lactic acidosis with a lactic level of 2.5, leukocytosis with a WBC of 14.4, proBNP greater than 4500.  She is being admitted to the ICU for further management. Upon arrival in the ICU, patient remains confused, restless, hypothermic, congested, audible rhonchi, tachypneic and hypoxemia.She received 2 doses of furosemide without any effect.  She is on 6 L nasal cannula. Her blood pressures remain low with systolic readings in the 58N and maps in the low 60s.   Her oxygen needs continue to increase and her mentation continues to decline.  She is not able to tolerate BiPAP.  She is currently on dopamine and broad-spectrum antibiotics.  Nephrology has been consulted per ED documentation but there is no plan for any emergent dialysis tonight.  Last 2D echo was in June 2020 and showed an EF of 50 to 55%.    I spoke at length with patient's daughter Mrs. Lori Johnson regarding patient's CODE STATUS and goals of care.  She indicated that they are concerned about patient's declining functional and mental status as well as her end-stage renal disease requiring dialysis but he had not come to a concrete decision.  She was going to talk to her siblings and then call me back.   Past Medical History:  Diagnosis Date  . Angiopathy, peripheral ([place])   . Arterial embolus and thrombosis ([place])   . ASCVD (arteriosclerotic cardiovascular disease)    with stent RCA  . Bilateral bunions   . Cancer ([place])    SCC removed off left leg  . Cervical lymphadenitis   . Chronic gout of left ankle due to renal impairment without tophus   . Chronic infection June 2014   Has chronic infection of right femoral graft with history of extensive debridement in June 2014. Was on oral Clindamycin for 3 weeks for culture positive for anerobes and staph lugdunensis. Vascular Surgeon at [facility] Remonia Richter, MD) recommended indefinite suppressive therapy with Cephalexin 500 mg qd and reassessment every 3 months. Will refill Cephalexin.)  . Chronic kidney disease   . COPD (chronic obstructive pulmonary disease) ([place])   . CRF (chronic renal failure)   .  GERD (gastroesophageal reflux disease)   . Hiatal hernia   . History of arterial bypass of lower extremity   . Hyperlipidemia   . Hypertension   . Hyperuricemia   . Macular degeneration   . Mass of right side of neck   . Nocturia   . OA (osteoarthritis)   . PVD (peripheral vascular disease) ([place])   . Renal disorder    Past Surgical  History:  Procedure Laterality Date  . ABDOMINAL HYSTERECTOMY  1981  . arterial bypass right leg Right   . broken left foot Left 01/2002  . CHOLECYSTECTOMY    . CORONARY ANGIOPLASTY WITH STENT PLACEMENT    . DIALYSIS/PERMA CATHETER INSERTION N/A 06/01/2019   Procedure: DIALYSIS/PERMA CATHETER INSERTION;  Surgeon: Algernon Huxley, MD;  Location: [place] [facility];  Service: Cardiovascular;  Laterality: N/A;  . stent in leg    . TEMPORARY DIALYSIS CATHETER N/A 05/31/2019   Procedure: TEMPORARY DIALYSIS CATHETER;  Surgeon: Algernon Huxley, MD;  Location: [place] [facility];  Service: Cardiovascular;  Laterality: N/A;   No current facility-administered medications on file prior to encounter.    Current Outpatient Medications on File Prior to Encounter  Medication Sig  . aspirin EC 81 MG tablet Take 81 mg by mouth daily.  [name] doxycycline (VIBRAMYCIN) 100 MG capsule Take 1 capsule (100 mg total) by mouth 2 (two) times daily.  . furosemide (LASIX) 20 MG tablet Take 60 mg by mouth 2 (two) times daily.  . Loratadine (CLARITIN PO) Take 10 mg by mouth daily.   . Multiple Vitamins-Minerals (ICAPS AREDS 2 PO) Take 1 tablet by mouth daily.  . pantoprazole (PROTONIX) 20 MG tablet Take 2 caps. Daily in am. (Patient taking differently: Take 40 mg by mouth 2 (two) times daily. Take 2 caps. Daily in am.)  . potassium chloride (MICRO-K) 10 MEQ CR capsule Take 10 mEq by mouth daily.   . vitamin B-12 (CYANOCOBALAMIN) 100 MCG tablet Take 100 mcg by mouth daily.  . Acetaminophen (TYLENOL ARTHRITIS PAIN PO) Take by mouth as needed.   [name] albuterol (VENTOLIN HFA) 108 (90 Base) MCG/ACT inhaler TAKE 2 PUFFS BY MOUTH EVERY 6 HOURS AS NEEDED FOR WHEEZE OR SHORTNESS OF BREATH  . allopurinol (ZYLOPRIM) 100 MG tablet Take 100 mg by mouth 2 (two) times daily.   . cephALEXin (KEFLEX) 500 MG capsule Take 1 capsule (500 mg total) by mouth 4 (four) times daily. (Patient not taking: Reported on 11/24/2019)  . colchicine 0.6 MG tablet  Take 0.5 tablets (0.3 mg total) by mouth 3 (three) times a week.  . hydrocortisone valerate cream (WESTCORT) 0.2 % Apply 1 application topically as needed.   . OXYGEN Inhale 2 L into the lungs.  . pravastatin (PRAVACHOL) 20 MG tablet TAKE 1 TABLET AT BEDTIME (Patient taking differently: Take 20 mg by mouth daily. )    Allergies Allergies  Allergen Reactions  . Pletal [Cilostazol] Swelling  . Erythromycin Hives  . Codeine Nausea Only and Nausea And Vomiting  . Erythromycin Base Itching and Rash  . Omnicef [Cefdinir] Itching and Rash  . Penicillins Hives, Itching and Rash  . Sulfa Antibiotics Rash    Family History Family History  Problem Relation Age of Onset  . Heart disease Mother   . Heart disease Father    Social History  reports that she has quit smoking. Her smoking use included cigarettes. She has never used smokeless tobacco. She reports that she does not drink alcohol or use drugs.  Review Of Systems: Unable to obtain due to patient's mental status  VITAL SIGNS: BP 102/84   Pulse 65   Temp (!) 96 F (35.6 C) (Axillary)   Resp 17   Ht 5' (1.524 m)   Wt 70 kg   SpO2 98%   BMI 30.14 kg/m   HEMODYNAMICS:    VENTILATOR SETTINGS:    INTAKE / OUTPUT: No intake/output data recorded.  PHYSICAL EXAMINATION: General: Frail older adult, acutely ill looking HEENT: PERRLA, trachea midline, moderate to severe JVD Neuro: Agitated, restless, moves all extremities, unable to follow commands Cardiovascular: Apical pulse regular, S1-S2, no murmur regurg or gallop, +2 pulses bilaterally, venous stasis discoloration and ulceration in bilateral lower extremities Lungs: Severely increased work of breathing, diffuse crackles and rhonchi in all lung fields, breath sounds significantly diminished in the bases bilaterally with bibasilar Rales Abdomen: Nondistended, normal bowel sounds in all 4 quadrants, palpation reveals diffuse tenderness Musculoskeletal: Positive range of  motion, no joint deformities Skin: Multiple bruises and venous ulcers in bilateral lower extremities at different stages of healing, left lower extremity with blood-filled blister in the outer aspect  LABS:  BMET Recent Labs  Lab 11/03/2019 1635  NA 131*  K 4.9  CL 96*  CO2 20*  BUN 119*  CREATININE 4.25*  GLUCOSE 81    Electrolytes Recent Labs  Lab 11/21/2019 1635  CALCIUM 9.4  MG 2.0  PHOS 6.6*    CBC Recent Labs  Lab 11/01/2019 1451  WBC 14.4*  HGB 11.1*  HCT 32.6*  PLT 139*    Coag's No results for input(s): APTT, INR in the last 168 hours.  Sepsis Markers Recent Labs  Lab 11/18/2019 1635  LATICACIDVEN 1.3  PROCALCITON 1.18    ABG No results for input(s): PHART, PCO2ART, PO2ART in the last 168 hours.  Liver Enzymes Recent Labs  Lab 11/16/2019 1635  AST 69*  ALT 31  ALKPHOS 89  BILITOT 1.1  ALBUMIN 3.4*    Cardiac Enzymes No results for input(s): TROPONINI, PROBNP in the last 168 hours.  Glucose Recent Labs  Lab 11/20/2019 2115 11/14/2019 2116  GLUCAP <10* 26*    Imaging Dg Chest Port 1 View  Result Date: 11/26/2019 CLINICAL DATA:  Altered mental status. EXAM: PORTABLE CHEST 1 VIEW COMPARISON:  06/05/2019 . FINDINGS: right sided dialysis catheter is identified. Cardiac enlargement. Aortic atherosclerosis. Moderate to large bilateral pleural effusions are identified, right greater than left. Opacity within the right midlung is noted. Pulmonary vascular congestion noted. IMPRESSION: 1. Moderate to large bilateral pleural effusions, right greater than left. 2. Right midlung opacity may represent pneumonia. Electronically Signed   By: Kerby Moors M.D.   On: 11/19/2019 15:25    STUDIES:  2D echo pending  CULTURES: Blood cultures x2  ANTIBIOTICS: Azithromycin 11/08/2019> Ceftriaxone 11/21/2019>  SIGNIFICANT EVENTS: 11/07/2019: Admitted  LINES/TUBES: Peripheral IVs Foley catheter  DISCUSSION: This is an 85 year old frail older adult with  significant medical history presenting with acute hypoxic respiratory failure, bilateral pleural effusions, pneumonia, sepsis secondary to pneumonia, septic shock, end-stage renal disease on intermittent hemodialysis and severe pulmonary edema overall prognosis is poor.  ASSESSMENT / PLAN:  PULMONARY A: Acute hypoxic respiratory failure Acute pulmonary edema Bilateral pleural effusions Community-acquired pneumonia COPD P:   Patient is an extremely poor candidate for endotracheal intubation.  I have reviewed the risks and benefits at length with her daughter.  She is not willing to make a decision regarding intubation and CPR at this point.  She will talk to  her siblings and get back to me as soon as possible. Furosemide 40 mg IV given as to without any effect Continue supplemental oxygen Based on family's decision will recall nephrology for emergent dialysis Continue broad-spectrum antibiotics IR consult for thoracentesis Nebulized bronchodilators  CARDIOVASCULAR A:  Septic shock Acute on chronic diastolic heart failure-proBNP greater than 4500 Elevated troponin-likely demand ischemia from volume overload History of hypertension, hyperlipidemia and CAD s/p stent to the RCA P:  Hemodynamic monitoring per ICU protocol Dopamine infusion to maintain mean arterial blood pressure greater than 65 Hold all antihypertensives in light of shock  RENAL A:   End-stage renal disease on intermittent dialysis P:   Per patient's daughter, she was supposed to be transitioned to home dialysis twice a week. Creatinine up to 4.25 from her baseline of 2.895 months ago Awaiting nephrology input and family's decision regarding aggressive versus conservative treatment given patient's age and comorbidity burden. Monitor and correct electrolytes  GASTROINTESTINAL A:   Diffuse abdominal pain on exam-lactic acid trending up P:   We will obtain a CT abdomen once patient is stable  HEMATOLOGIC A:    History of arterial thrombus Severe PVD P:  Not currently on any anticoagulation Wound care consult lower extremity venous ulcers  INFECTIOUS A:   Sepsis secondary to pneumonia-patient is currently hypothermic P:   Antibiotics as above Follow-up cultures Procalcitonin trending up-continue to trend and adjust antibiotics Monitor fever curve  NEUROLOGIC A:   Severe encephalopathy-multifactorial etiology; patient is severely agitated and delirious P:   Safety precautions Continue to monitor mental status; high risk for deterioration given multiple comorbidities.   Best Practice: Code Status: Full code Diet: N.p.o. until mental status improves GI prophylaxis: Protonix VTE prophylaxis: SCDs and subcu heparin  FAMILY  - Updates: Spoke with daughter at length regarding goals of care, patient's current status and overall prognosis.  Awaiting callback from her regarding CODE STATUS and overall goals of care.  Will call update with any changes in patient's condition.  Total time spent at the bedside coordinating patient's care and speaking with family is 3 hours  Zong Mcquarrie S. Tukov-Yual, ANP-BC Pulmonary and [place] Pager [phone] or [phone]  NB: This document was prepared using Dragon voice recognition software and may include unintentional dictation errors.   11/17/2019, 9:40 PM   Addendum November 14, 2019 at 2:15 AM  Received no call back from patient's daughter.  Patient became more hypoxic, and more obtunded requiring emergent intubation.  Family notified and requested that we should hold on until they get to the hospital.  Permission obtained from the nursing supervisor to have patient's children coming.  Patient's children arrived patient's bedside at 3 AM and had a family meeting with this Probation officer, Camera operator and patient's designated nurse Cinthia V.  Family acknowledge patient's overall declining health and current change in her  whole status from when she came to the emergency room.  Overall patient status in changes that have occurred since arrival in the ICU reviewed with them at length as well as treatment options that included both conservative/comfort care versus aggressive measures which will include intubation, CPR in aggressive resuscitation with pressors and emergent hemohemodialysis.  Family indicated that they have seen patient suffer through from May 2020 when she was diagnosed with pneumonia up to now.  They do not feel that she will be back to her baseline even after any aggressive measures.  At this point they decided that they wanted patient to be comfortable in be allowed  to die peacefully.  Patient was made a DNR/DNI/ comfort care and end-of-life care orders were placed.  Patient was given 1 dose of morphine 2 mg IV x1 and she expired peacefully 5 minutes later.  All family at bedside.  Support provided.  They were grateful for the care provided to their mother.  The certificate completed and placed in patient's chart.  Total time spent at the bedside is 1 hour  Teighan Aubert S. Tukov-Yual,  ANP-BC Pulmonary and [place] Pager [phone] or [phone]  NB: This document was prepared using Dragon voice recognition software and may include unintentional dictation errors.

## 2019-11-05 ENCOUNTER — Ambulatory Visit: Payer: Medicare PPO

## 2019-11-05 DIAGNOSIS — J90 Pleural effusion, not elsewhere classified: Secondary | ICD-10-CM

## 2019-11-05 DIAGNOSIS — A419 Sepsis, unspecified organism: Secondary | ICD-10-CM

## 2019-11-05 DIAGNOSIS — J810 Acute pulmonary edema: Secondary | ICD-10-CM

## 2019-11-05 DIAGNOSIS — R6521 Severe sepsis with septic shock: Secondary | ICD-10-CM

## 2019-11-05 LAB — BASIC METABOLIC PANEL
Anion gap: 17 — ABNORMAL HIGH (ref 5–15)
BUN: 120 mg/dL — ABNORMAL HIGH (ref 8–23)
CO2: 20 mmol/L — ABNORMAL LOW (ref 22–32)
Calcium: 9.1 mg/dL (ref 8.9–10.3)
Chloride: 96 mmol/L — ABNORMAL LOW (ref 98–111)
Creatinine, Ser: 4.49 mg/dL — ABNORMAL HIGH (ref 0.44–1.00)
GFR calc Af Amer: 10 mL/min — ABNORMAL LOW (ref >60)
GFR calc non Af Amer: 8 mL/min — ABNORMAL LOW (ref >60)
Glucose, Bld: 94 mg/dL (ref 70–99)
Potassium: 5 mmol/L (ref 3.5–5.1)
Sodium: 133 mmol/L — ABNORMAL LOW (ref 135–145)

## 2019-11-05 LAB — CBC
HCT: 36.3 % (ref 36.0–46.0)
Hemoglobin: 11.6 g/dL — ABNORMAL LOW (ref 12.0–15.0)
MCH: 33.4 pg (ref 26.0–34.0)
MCHC: 32 g/dL (ref 30.0–36.0)
MCV: 104.6 fL — ABNORMAL HIGH (ref 80.0–100.0)
Platelets: 90 10*3/uL — ABNORMAL LOW (ref 150–400)
RBC: 3.47 MIL/uL — ABNORMAL LOW (ref 3.87–5.11)
RDW: 17.7 % — ABNORMAL HIGH (ref 11.5–15.5)
WBC: 14.2 10*3/uL — ABNORMAL HIGH (ref 4.0–10.5)
nRBC: 0.8 % — ABNORMAL HIGH (ref 0.0–0.2)

## 2019-11-05 LAB — BLOOD GAS, ARTERIAL
Acid-base deficit: 4.5 mmol/L — ABNORMAL HIGH (ref 0.0–2.0)
Bicarbonate: 23.6 mmol/L (ref 20.0–28.0)
FIO2: 0.44
O2 Saturation: 35.7 %
Patient temperature: 37
pCO2 arterial: 55 mmHg — ABNORMAL HIGH (ref 32.0–48.0)
pH, Arterial: 7.24 — ABNORMAL LOW (ref 7.350–7.450)
pO2, Arterial: <31 mmHg — CL (ref 83.0–108.0)

## 2019-11-05 LAB — LACTIC ACID, PLASMA: Lactic Acid, Venous: 2.5 mmol/L (ref 0.5–1.9)

## 2019-11-05 LAB — PROCALCITONIN: Procalcitonin: 1.31 ng/mL

## 2019-11-05 LAB — TROPONIN I (HIGH SENSITIVITY): Troponin I (High Sensitivity): 254 ng/L (ref <18)

## 2019-11-05 MED ORDER — POLYVINYL ALCOHOL 1.4 % OP SOLN
1.0000 [drp] | Freq: Four times a day (QID) | OPHTHALMIC | Status: DC | PRN
  Filled 2019-11-05: qty 15

## 2019-11-05 MED ORDER — BIOTENE DRY MOUTH MT LIQD: 15.0000 mL | OROMUCOSAL | Status: DC | PRN

## 2019-11-05 MED ORDER — MORPHINE SULFATE (PF) 2 MG/ML IV SOLN
1.0000 mg | INTRAVENOUS | Status: DC | PRN
  Administered 2019-11-05: 04:00:00 2 mg via INTRAVENOUS

## 2019-11-05 MED ORDER — ONDANSETRON HCL 4 MG/2ML IJ SOLN: 4.0000 mg | Freq: Four times a day (QID) | INTRAMUSCULAR | Status: DC | PRN

## 2019-11-05 MED ORDER — GLYCOPYRROLATE 0.2 MG/ML IJ SOLN: 0.2000 mg | INTRAMUSCULAR | Status: DC | PRN

## 2019-11-05 MED ORDER — GLYCOPYRROLATE 1 MG PO TABS
1.0000 mg | ORAL_TABLET | ORAL | Status: DC | PRN
  Filled 2019-11-05: qty 1

## 2019-11-05 MED ORDER — FUROSEMIDE 10 MG/ML IJ SOLN
40.0000 mg | Freq: Two times a day (BID) | INTRAMUSCULAR | Status: DC
  Administered 2019-11-05: 40 mg via INTRAVENOUS
  Filled 2019-11-05: qty 4

## 2019-11-05 MED ORDER — ONDANSETRON 4 MG PO TBDP
4.0000 mg | ORAL_TABLET | Freq: Four times a day (QID) | ORAL | Status: DC | PRN
  Filled 2019-11-05: qty 1

## 2019-11-05 MED ORDER — MORPHINE SULFATE (PF) 2 MG/ML IV SOLN
INTRAVENOUS | Status: AC
  Administered 2019-11-05: 2 mg via INTRAVENOUS
  Filled 2019-11-05: qty 1

## 2019-11-06 LAB — GLUCOSE, CAPILLARY: Glucose-Capillary: <10 mg/dL — CL (ref 70–99)

## 2019-11-09 LAB — CULTURE, BLOOD (ROUTINE X 2)
Culture: NO GROWTH
Culture: NO GROWTH
Special Requests: ADEQUATE
Special Requests: ADEQUATE

## 2019-11-13 ENCOUNTER — Ambulatory Visit: Payer: Medicare PPO | Admitting: Internal Medicine

## 2019-12-01 NOTE — Death Summary Note (Signed)
Death Summary  Lori Johnson LMB:[id number] DOB: June 25, 1930 DOA: 11-09-19  PCP: Margo Common, PA PCP/Office notified: [name], PA, notified by secure chat  Admit date: Nov 09, 2019 Date of Death: 11-10-19  Final Diagnoses:  Principal Problem:   Sepsis due to pneumonia [facility]) Active Problems:   Acute on chronic respiratory failure with hypoxia ([place])   Essential (primary) hypertension   Asthma, cough variant   Chronic infection   Gastroesophageal reflux disease with hiatal hernia   Chronic kidney disease    1. Sepsis due to pneumonia with acute on chronic respiratory failure   History of present illness:  Lori Johnson is a 86 y.o. female with medical history significant of chronic kidney disease approaching ESRD on intermittent dialysis (last dialyzed yesterday, 12/4), COPD, chronic mixed CHF (echo from June 2020 EF 50 to 55% with impaired relaxation aortic stenosis), aortic stenosis, peripheral vascular disease, gout who presented to the ED today due to altered mental status and generalized weakness.  History is provided by patient's daughter at bedside due to patient somnolent and minimally arousable.  She reports that patient had dialysis on 2 consecutive days before Thanksgiving, and appeared to feel weak and fatigued.  She then developed a sore throat, had some pain with swallowing but no cough or congestion, no fevers or chills.  She was seen at urgent care on Friday and prescribed doxycycline for sinus infection.  Since that time patient has become progressively weak, sleeping more than normal, confused, agitated at times, and with poor p.o. intake.  Patient did complain at home about being very hot however no actual fevers.  She has not had any abdominal, nausea or vomiting.  Did have some intermittent diarrhea after starting doxy.  Family have not noticed any aspiration with swallowing.  Daughter reports the patient has been on supplemental oxygen since May or June when she  was apparently admitted for pneumonia, was using it only with ambulation and during sleep, but has needed it continuously for the past week.  ED Course: Temp 96 F, tachypneic, hypotensive and requiring 3 L/min oxygen.  Labs notable for sodium 131, chloride 96, bicarb 20, BUN 119, creatinine 4.25, phosphorus 6.6, albumin 3.4, total bili 1.1 normal, BNPgreater than 4500, troponin 251, lactic normal at 1.3, pro-Cal normal at 1.18, leukocytosis 14.4 (neutrophil predominant, leukopenic), hemoglobin 11.1.  UA was normal.  Group A strep negative.  COVID NEGATIVE.  Chest x-ray showed bilateral pleural effusions, right greater than left and opacity in the right midlung concerning for pneumonia.  Patient was started on Rocephin and azithromycin in the ED.  ED provider discussed case with nephrology Dr. Abigail Butts, they will follow.  Admitted to stepdown unit with PCCM consulted.  Hospital Course:  Patient was admitted to ICU/step down unit.  Overnight, patient continued to deteriorate clinically, and had steadily increasing oxygen requirements.  Family was contacted regarding her status and decided on comfort care measures.  Patient passed away.   Time: 0340  Signed:  Ezekiel Slocumb, DO  [facility] 10-Nov-2019, 7:17 AM

## 2019-12-01 NOTE — Death Summary Note (Signed)
DEATH SUMMARY   Patient Details  Name: Lori Johnson MRN: [id number] DOB: Feb 07, 1930  Admission/Discharge Information   Admit Date:  November 05, 2019  Date of Death: Date of Death: 2019/11/06  Time of Death: Time of Death: 0340  Length of Stay: 1  Referring Physician: Margo Common, PA   Reason(s) for Hospitalization  Acute hypoxic respiratory failure, acute pulmonary edema and bilateral pleural effusions  Diagnoses  Preliminary cause of death: Cardiopulmonary arrest Secondary Diagnoses (including complications and co-morbidities):  Principal Problem:   Sepsis due to pneumonia [facility]) Active Problems:   Chronic kidney disease   Essential (primary) hypertension   Chronic infection   Asthma, cough variant   Gastroesophageal reflux disease with hiatal hernia   Acute on chronic respiratory failure with hypoxia [facility])   Brief Hospital Course (including significant findings, care, treatment, and services provided and events leading to death)  Lori Johnson is a 86 y.o. year old female who was  admitted with altered mental status, and generalized weakness.  History is obtained from ED records and from family as patient is currently confused and unable to provide a history.  Per patient's family, she has experienced a progressive decline since May when she developed pneumonia and was hospitalized.  She ended up with renal failure and her kidney function has progressively declined to the point where she is now requiring dialysis twice a week.  Over the course of the week, patient has not been her usual self, she is more confused and unable to perform any ADLs or ambulate.  She had dialysis on 11/03/2019.  She has had multiple doses of antibiotics but overall nothing seems to get her back to her baseline. At the ED, her work-up was significant for bilateral pleural effusions on chest x-ray, right greater than left, bilateral pneumonia, elevated troponin, lactic acidosis with a lactic level of 2.5,  leukocytosis with a WBC of 14.4, proBNP greater than 4500.  She is being admitted to the ICU for further management. Upon arrival in the ICU, patient remained confused, restless, hypothermic, congested, audible rhonchi, tachypneic and hypoxemia.She received 2 doses of furosemide without any effect.  She is on 6 L nasal cannula. Her blood pressures remain low with systolic readings in the 60F and maps in the low 60s.  Her oxygen needs continued to increase and her mentation continued to decline.  She is not able to tolerate BiPAP and nephrology was consulted.  This Probation officer  spoke at length with patient's daughter Lori Johnson regarding patient's CODE STATUS and goals of care.  She indicated that they are concerned about patient's declining functional and mental status as well as her end-stage renal disease requiring dialysis but he had not come to a concrete decision.  She was going to talk to her siblings and then call us back.  However at about 2:15 AM,Patient became more hypoxic, and more obtunded requiring emergent intubation.  Family notified and requested that we should hold on until they get to the hospital.  Permission obtained from the nursing supervisor to have patient's children coming.  Patient's children arrived patient's bedside at 3 AM and had a family meeting with this Probation officer, Camera operator and patient's designated nurse Cinthia V.  Family acknowledge patient's overall declining health and current change in her whole status from when she came to the emergency room.  Overall patient status in changes that have occurred since arrival in the ICU reviewed with them at length as well as treatment options that included both conservative/comfort care  versus aggressive measures which will include intubation, CPR in aggressive resuscitation with pressors and emergent hemohemodialysis.  Family indicated that they have seen patient suffer through from May 2020 when she was diagnosed with pneumonia up to  now.  They do not feel that she will be back to her baseline even after any aggressive measures.  At this point they decided that they wanted patient to be comfortable in be allowed to die peacefully.  Patient was made a DNR/DNI/ comfort care and end-of-life care orders were placed.  Patient was given 1 dose of morphine 2 mg IV x1 and she expired peacefully 5 minutes later.  All family at bedside.  Support provided.  They were grateful for the care provided to their mother.  The certificate completed and placed in patient's chart.  Pertinent Labs and Studies  Significant Diagnostic Studies Dg Chest Port 1 View  Result Date: 11/15/2019 CLINICAL DATA:  Altered mental status. EXAM: PORTABLE CHEST 1 VIEW COMPARISON:  06/05/2019 . FINDINGS: right sided dialysis catheter is identified. Cardiac enlargement. Aortic atherosclerosis. Moderate to large bilateral pleural effusions are identified, right greater than left. Opacity within the right midlung is noted. Pulmonary vascular congestion noted. IMPRESSION: 1. Moderate to large bilateral pleural effusions, right greater than left. 2. Right midlung opacity may represent pneumonia. Electronically Signed   By: Kerby Moors M.D.   On: 11/08/2019 15:25    Microbiology Recent Results (from the past 240 hour(s))  Novel Coronavirus, NAA ([facility])     Status: None   Collection Time: 10/27/19  2:51 PM   Specimen: Nasal Mucosa; Nasopharyngeal(NP) swabs in vial transport medium   NASOPHARYNGE  Result Value Ref Range Status   SARS-CoV-2, NAA Not Detected Not Detected Final    Comment: This nucleic acid amplification test was developed and its performance characteristics determined by [employer]. Nucleic acid amplification tests include PCR and TMA. This test has not been FDA cleared or approved. This test has been authorized by FDA under an Emergency Use Authorization (EUA). This test is only authorized for the duration of time the declaration that  circumstances exist justifying the authorization of the emergency use of in vitro diagnostic tests for detection of SARS-CoV-2 virus and/or diagnosis of COVID-19 infection under section 564(b)(1) of the Act, 21 U.S.C. 831DVV-6(H) (1), unless the authorization is terminated or revoked sooner. When diagnostic testing is negative, the possibility of a false negative result should be considered in the context of a patient's recent exposures and the presence of clinical signs and symptoms consistent with COVID-19. An individual without symptoms of COVID-19 and who is not shedding SARS-CoV-2 virus would  expect to have a negative (not detected) result in this assay.   Culture, group A strep     Status: None   Collection Time: 10/27/19  4:11 PM   Specimen: Throat  Result Value Ref Range Status   Specimen Description THROAT  Final   Special Requests NONE  Final   Culture   Final    NO GROUP A STREP (S.PYOGENES) ISOLATED Performed at [facility], [place] [address] [place], [place] [address]    Report Status 10/30/2019 FINAL  Final  SARS Coronavirus 2 by RT PCR (hospital order, performed in [facility] hospital lab) Nasopharyngeal Nasopharyngeal Swab     Status: None   Collection Time: 11/03/2019  2:51 PM   Specimen: Nasopharyngeal Swab  Result Value Ref Range Status   SARS Coronavirus 2 NEGATIVE NEGATIVE Final    Comment: (NOTE) SARS-CoV-2 target  nucleic acids are NOT DETECTED. The SARS-CoV-2 RNA is generally detectable in upper and lower respiratory specimens during the acute phase of infection. The lowest concentration of SARS-CoV-2 viral copies this assay can detect is 250 copies / mL. A negative result does not preclude SARS-CoV-2 infection and should not be used as the sole basis for treatment or other patient management decisions.  A negative result may occur with improper specimen collection / handling, submission of specimen other than nasopharyngeal swab, presence of viral  mutation(s) within the areas targeted by this assay, and inadequate number of viral copies (<250 copies / mL). A negative result must be combined with clinical observations, patient history, and epidemiological information. Fact Sheet for Patients:   StrictlyIdeas.no Fact Sheet for Healthcare Providers: BankingDealers.co.za This test is not yet approved or cleared  by the [place] FDA and has been authorized for detection and/or diagnosis of SARS-CoV-2 by FDA under an Emergency Use Authorization (EUA).  This EUA will remain in effect (meaning this test can be used) for the duration of the COVID-19 declaration under Section 564(b)(1) of the Act, 21 U.S.C. section 360bbb-3(b)(1), unless the authorization is terminated or revoked sooner. Performed at [facility], [address] [place], [place] [address]     Lab Basic Metabolic Panel: Recent Labs  Lab 10/31/2019 1635 11/02/2019 2331  NA 131* 133*  K 4.9 5.0  CL 96* 96*  CO2 20* 20*  GLUCOSE 81 94  BUN 119* 120*  CREATININE 4.25* 4.49*  CALCIUM 9.4 9.1  MG 2.0  --   PHOS 6.6*  --    Liver Function Tests: Recent Labs  Lab 11/19/2019 1635  AST 69*  ALT 31  ALKPHOS 89  BILITOT 1.1  PROT 5.8*  ALBUMIN 3.4*   No results for input(s): LIPASE, AMYLASE in the last 168 hours. No results for input(s): AMMONIA in the last 168 hours. CBC: Recent Labs  Lab 11/03/2019 1451 11/09/2019 2331  WBC 14.4* 14.2*  NEUTROABS 13.2*  --   HGB 11.1* 11.6*  HCT 32.6* 36.3  MCV 98.8 104.6*  PLT 139* 90*   Cardiac Enzymes: No results for input(s): CKTOTAL, CKMB, CKMBINDEX, TROPONINI in the last 168 hours. Sepsis Labs: Recent Labs  Lab 11/21/2019 1451 11/01/2019 1635 10/31/2019 2331  PROCALCITON  --  1.18 1.31  WBC 14.4*  --  14.2*  LATICACIDVEN  --  1.3 2.5*    Procedures/Operations  Foley catheter Peripheral line  Magddalene S Tukov-Yual November 18, 2019, 0400

## 2019-12-01 NOTE — Progress Notes (Signed)
Patient from ER to ICU 09 on 3L [place], lethargic, tachypneic and disoriented x 4. Patient progressively getting worse through the night, increase in oxygen demand, no urine output, tachypneic, accessory muscle use, and audible respiratory secretions. NP Tukov aware and family contacted. Patient transitioned to comfort care and time of death called at 78.

## 2019-12-01 NOTE — Death Summary Note (Signed)
DEATH SUMMARY   Patient Details  Name: Lori Johnson MRN: [id number] DOB: 23-Dec-1929  Admission/Discharge Information   Admit Date:  November 19, 2019  Date of Death: Date of Death: 11/20/2019  Time of Death: Time of Death: 0340  Length of Stay: 1  Referring Physician: Margo Common, PA   Reason(s) for Hospitalization  Acute hypoxic respiratory failure, acute pulmonary edema and bilateral pleural effusions  Diagnoses  Preliminary cause of death: Cardiopulmonary arrest Secondary Diagnoses (including complications and co-morbidities):  Principal Problem:   Sepsis due to pneumonia [facility]) Active Problems:   Chronic kidney disease   Essential (primary) hypertension   Chronic infection   Asthma, cough variant   Gastroesophageal reflux disease with hiatal hernia   Acute on chronic respiratory failure with hypoxia [facility])   Brief Hospital Course (including significant findings, care, treatment, and services provided and events leading to death)  Lori Johnson is a 86 y.o. year old female who was  admitted with altered mental status, and generalized weakness.  History is obtained from ED records and from family as patient is currently confused and unable to provide a history.  Per patient's family, she has experienced a progressive decline since May when she developed pneumonia and was hospitalized.  She ended up with renal failure and her kidney function has progressively declined to the point where she is now requiring dialysis twice a week.  Over the course of the week, patient has not been her usual self, she is more confused and unable to perform any ADLs or ambulate.  She had dialysis on 11/03/2019.  She has had multiple doses of antibiotics but overall nothing seems to get her back to her baseline. At the ED, her work-up was significant for bilateral pleural effusions on chest x-ray, right greater than left, bilateral pneumonia, elevated troponin, lactic acidosis with a lactic level of 2.5,  leukocytosis with a WBC of 14.4, proBNP greater than 4500.  She is being admitted to the ICU for further management. Upon arrival in the ICU, patient remained confused, restless, hypothermic, congested, audible rhonchi, tachypneic and hypoxemia.She received 2 doses of furosemide without any effect.  She is on 6 L nasal cannula. Her blood pressures remain low with systolic readings in the 09Q and maps in the low 60s.  Her oxygen needs continued to increase and her mentation continued to decline.  She is not able to tolerate BiPAP and nephrology was consulted.  This Probation officer  spoke at length with patient's daughter [name] regarding patient's CODE STATUS and goals of care.  She indicated that they are concerned about patient's declining functional and mental status as well as her end-stage renal disease requiring dialysis but he had not come to a concrete decision.  She was going to talk to her siblings and then call us back.  However at about 2:15 AM,Patient became more hypoxic, and more obtunded requiring emergent intubation.  Family notified and requested that we should hold on until they get to the hospital.  Permission obtained from the nursing supervisor to have patient's children coming.  Patient's children arrived patient's bedside at 3 AM and had a family meeting with this Probation officer, Camera operator and patient's designated nurse Cinthia V.  Family acknowledge patient's overall declining health and current change in her whole status from when she came to the emergency room.  Overall patient status in changes that have occurred since arrival in the ICU reviewed with them at length as well as treatment options that included both conservative/comfort care  versus aggressive measures which will include intubation, CPR in aggressive resuscitation with pressors and emergent hemohemodialysis.  Family indicated that they have seen patient suffer through from May 2020 when she was diagnosed with pneumonia up to  now.  They do not feel that she will be back to her baseline even after any aggressive measures.  At this point they decided that they wanted patient to be comfortable in be allowed to die peacefully.  Patient was made a DNR/DNI/ comfort care and end-of-life care orders were placed.  Patient was given 1 dose of morphine 2 mg IV x1 and she expired peacefully 5 minutes later.  All family at bedside.  Support provided.  They were grateful for the care provided to their mother.  The certificate completed and placed in patient's chart.  Pertinent Labs and Studies  Significant Diagnostic Studies Dg Chest Port 1 View  Result Date: 11/12/2019 CLINICAL DATA:  Altered mental status. EXAM: PORTABLE CHEST 1 VIEW COMPARISON:  06/05/2019 . FINDINGS: right sided dialysis catheter is identified. Cardiac enlargement. Aortic atherosclerosis. Moderate to large bilateral pleural effusions are identified, right greater than left. Opacity within the right midlung is noted. Pulmonary vascular congestion noted. IMPRESSION: 1. Moderate to large bilateral pleural effusions, right greater than left. 2. Right midlung opacity may represent pneumonia. Electronically Signed   By: Kerby Moors M.D.   On: 11/07/2019 15:25    Microbiology Recent Results (from the past 240 hour(s))  Novel Coronavirus, NAA ([facility])     Status: None   Collection Time: 10/27/19  2:51 PM   Specimen: Nasal Mucosa; Nasopharyngeal(NP) swabs in vial transport medium   NASOPHARYNGE  Result Value Ref Range Status   SARS-CoV-2, NAA Not Detected Not Detected Final    Comment: This nucleic acid amplification test was developed and its performance characteristics determined by [employer]. Nucleic acid amplification tests include PCR and TMA. This test has not been FDA cleared or approved. This test has been authorized by FDA under an Emergency Use Authorization (EUA). This test is only authorized for the duration of time the declaration that  circumstances exist justifying the authorization of the emergency use of in vitro diagnostic tests for detection of SARS-CoV-2 virus and/or diagnosis of COVID-19 infection under section 564(b)(1) of the Act, 21 U.S.C. 244WNU-2(V) (1), unless the authorization is terminated or revoked sooner. When diagnostic testing is negative, the possibility of a false negative result should be considered in the context of a patient's recent exposures and the presence of clinical signs and symptoms consistent with COVID-19. An individual without symptoms of COVID-19 and who is not shedding SARS-CoV-2 virus would  expect to have a negative (not detected) result in this assay.   Culture, group A strep     Status: None   Collection Time: 10/27/19  4:11 PM   Specimen: Throat  Result Value Ref Range Status   Specimen Description THROAT  Final   Special Requests NONE  Final   Culture   Final    NO GROUP A STREP (S.PYOGENES) ISOLATED Performed at [facility], [place] [address] [place], [place] [address]    Report Status 10/30/2019 FINAL  Final  SARS Coronavirus 2 by RT PCR (hospital order, performed in [facility] hospital lab) Nasopharyngeal Nasopharyngeal Swab     Status: None   Collection Time: 11/03/2019  2:51 PM   Specimen: Nasopharyngeal Swab  Result Value Ref Range Status   SARS Coronavirus 2 NEGATIVE NEGATIVE Final    Comment: (NOTE) SARS-CoV-2 target  nucleic acids are NOT DETECTED. The SARS-CoV-2 RNA is generally detectable in upper and lower respiratory specimens during the acute phase of infection. The lowest concentration of SARS-CoV-2 viral copies this assay can detect is 250 copies / mL. A negative result does not preclude SARS-CoV-2 infection and should not be used as the sole basis for treatment or other patient management decisions.  A negative result may occur with improper specimen collection / handling, submission of specimen other than nasopharyngeal swab, presence of viral  mutation(s) within the areas targeted by this assay, and inadequate number of viral copies (<250 copies / mL). A negative result must be combined with clinical observations, patient history, and epidemiological information. Fact Sheet for Patients:   StrictlyIdeas.no Fact Sheet for Healthcare Providers: BankingDealers.co.za This test is not yet approved or cleared  by the [place] FDA and has been authorized for detection and/or diagnosis of SARS-CoV-2 by FDA under an Emergency Use Authorization (EUA).  This EUA will remain in effect (meaning this test can be used) for the duration of the COVID-19 declaration under Section 564(b)(1) of the Act, 21 U.S.C. section 360bbb-3(b)(1), unless the authorization is terminated or revoked sooner. Performed at [facility], [address] [place], [place] [address]     Lab Basic Metabolic Panel: Recent Labs  Lab 11/09/2019 1635 11/18/2019 2331  NA 131* 133*  K 4.9 5.0  CL 96* 96*  CO2 20* 20*  GLUCOSE 81 94  BUN 119* 120*  CREATININE 4.25* 4.49*  CALCIUM 9.4 9.1  MG 2.0  --   PHOS 6.6*  --    Liver Function Tests: Recent Labs  Lab 11/06/2019 1635  AST 69*  ALT 31  ALKPHOS 89  BILITOT 1.1  PROT 5.8*  ALBUMIN 3.4*   No results for input(s): LIPASE, AMYLASE in the last 168 hours. No results for input(s): AMMONIA in the last 168 hours. CBC: Recent Labs  Lab 11/06/2019 1451 11/10/2019 2331  WBC 14.4* 14.2*  NEUTROABS 13.2*  --   HGB 11.1* 11.6*  HCT 32.6* 36.3  MCV 98.8 104.6*  PLT 139* 90*   Cardiac Enzymes: No results for input(s): CKTOTAL, CKMB, CKMBINDEX, TROPONINI in the last 168 hours. Sepsis Labs: Recent Labs  Lab 11/12/2019 1451 11/01/2019 1635 11/17/2019 2331  PROCALCITON  --  1.18 1.31  WBC 14.4*  --  14.2*  LATICACIDVEN  --  1.3 2.5*    Procedures/Operations  Foley catheter Peripheral IVs  Magddalene S Tukov-Yual 2019-12-01, 0400

## 2019-12-01 DEATH — deceased

## 2020-02-12 ENCOUNTER — Encounter: Payer: Medicare PPO | Admitting: Family Medicine

## 2020-02-12 ENCOUNTER — Ambulatory Visit: Payer: Medicare PPO

## 2020-12-01 IMAGING — CR DG CHEST 2V
1 series · 2 of 2 positions shown · non-contrast
Comparison: Chest x-rays dated 05/19/2018 10/19/2017. Chest CT
dated 11/15/2013.

CLINICAL DATA: Cough, shortness of breath and chest pain since
[REDACTED].

EXAM:
CHEST - 2 VIEW

[Series 1: dg chest 2 view · 0.14mm/px · 2 of 2 slices shown]
[im 1/2]
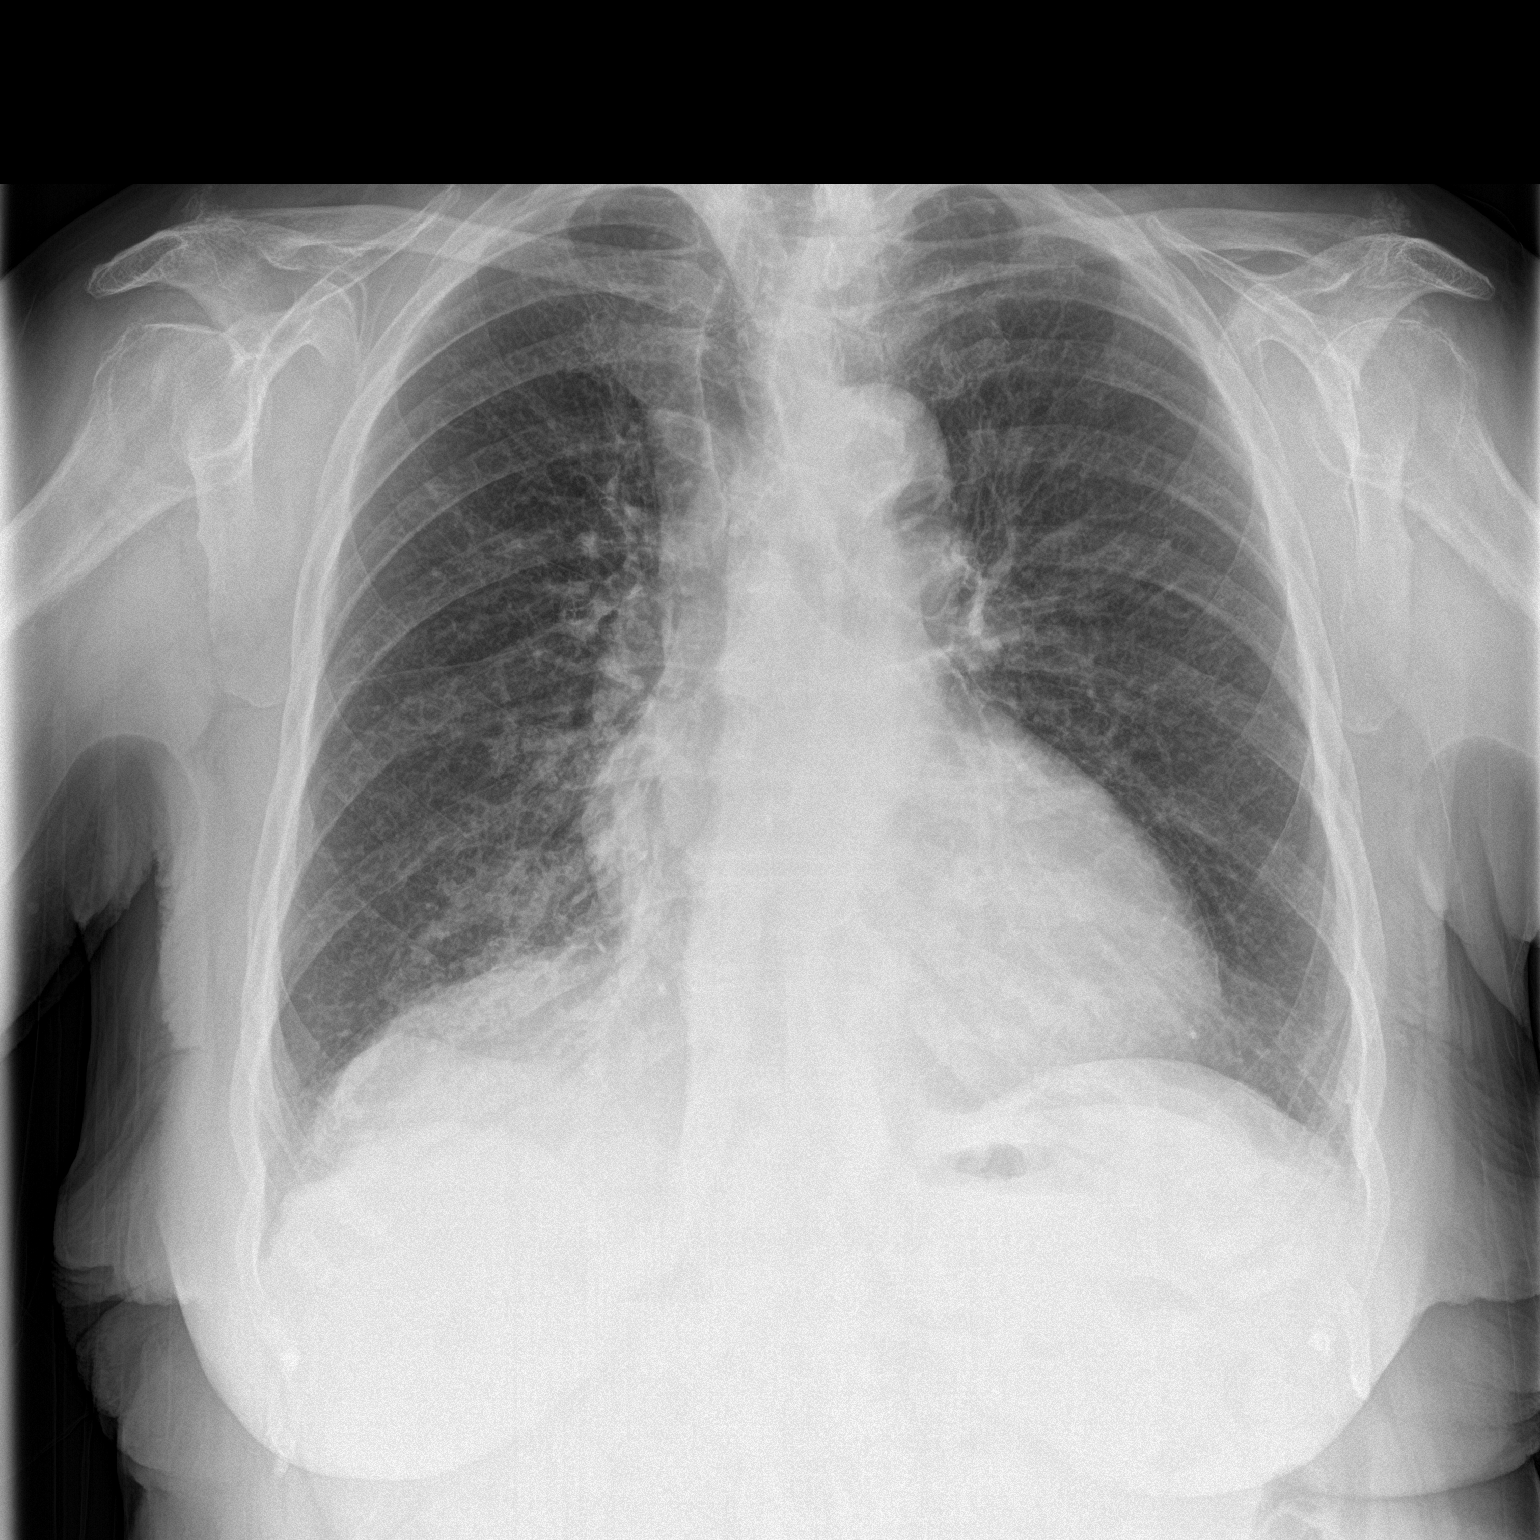
[im 2/2]
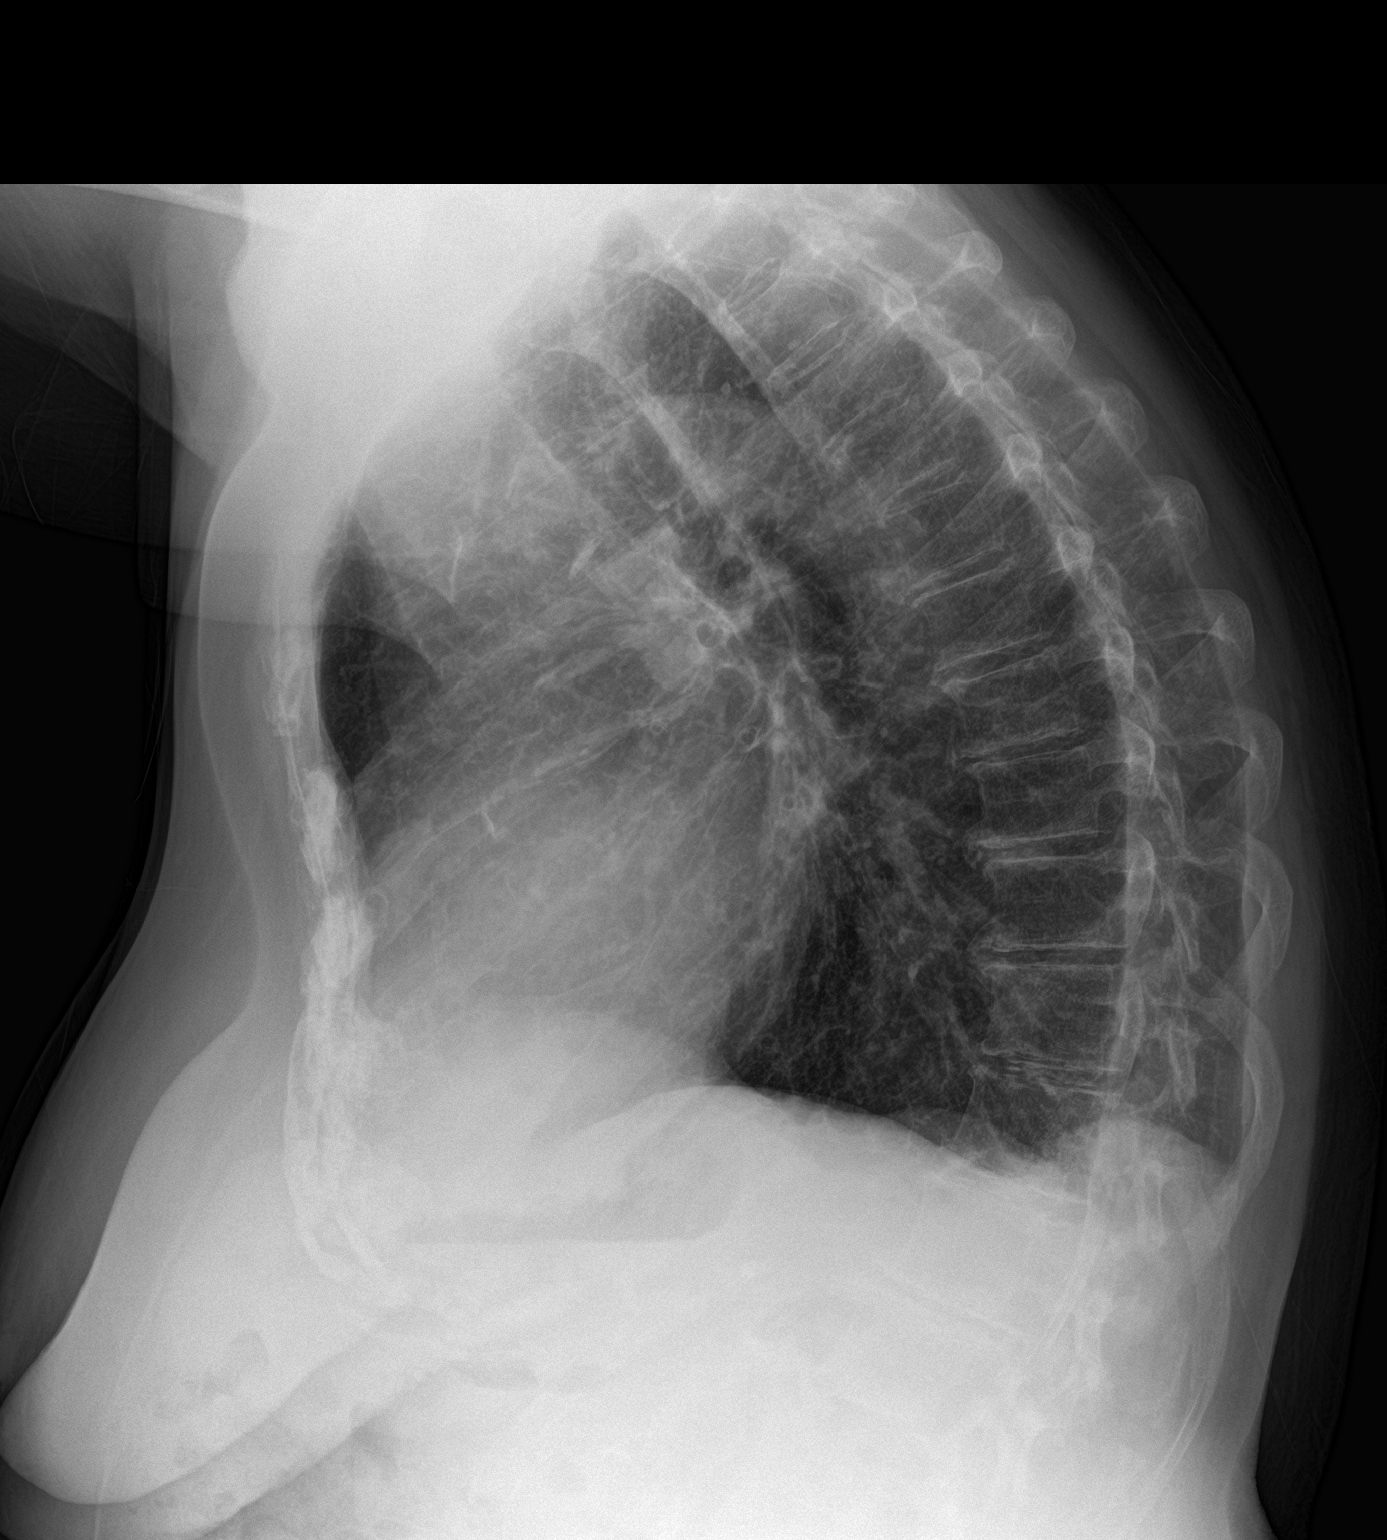

[2 of 2 positions shown; findings below may reference images not displayed]

FINDINGS: Heart size upper normal, stable. Coarse lung markings are again seen
bilaterally, most prominent at the RIGHT lung base, stable,
indicating chronic interstitial lung disease/fibrosis.

There is a masslike opacity at the posterior costophrenic angle,
best seen on the lateral projection, most likely focal eventration
of the RIGHT diaphragm based on the AP view, without correlate on
earlier chest CT of 11/15/2013. No confluent opacity to suggest a
developing pneumonia. No pleural effusion or pneumothorax seen. No
acute or suspicious osseous finding.
IMPRESSION: 1. No active cardiopulmonary disease. No evidence of pneumonia or
pulmonary edema.
2. Chronic interstitial lung disease/fibrosis.
3. Probable focal eventration of the posterior RIGHT hemidiaphragm
or Bochdalek's hernia. Would consider chest CT to confirm benignity.
At minimum, would recommend follow-up chest x-ray in 3 months to
ensure stability.

## 2021-03-13 IMAGING — DX PORTABLE CHEST - 1 VIEW
1 series · 1 of 1 positions shown · non-contrast
Comparison: 01/26/2019 and earlier.

CLINICAL DATA: 89-year-old female with increased shortness of
breath. Test for [id number] pending.

EXAM:
PORTABLE CHEST 1 VIEW

[chest ap]
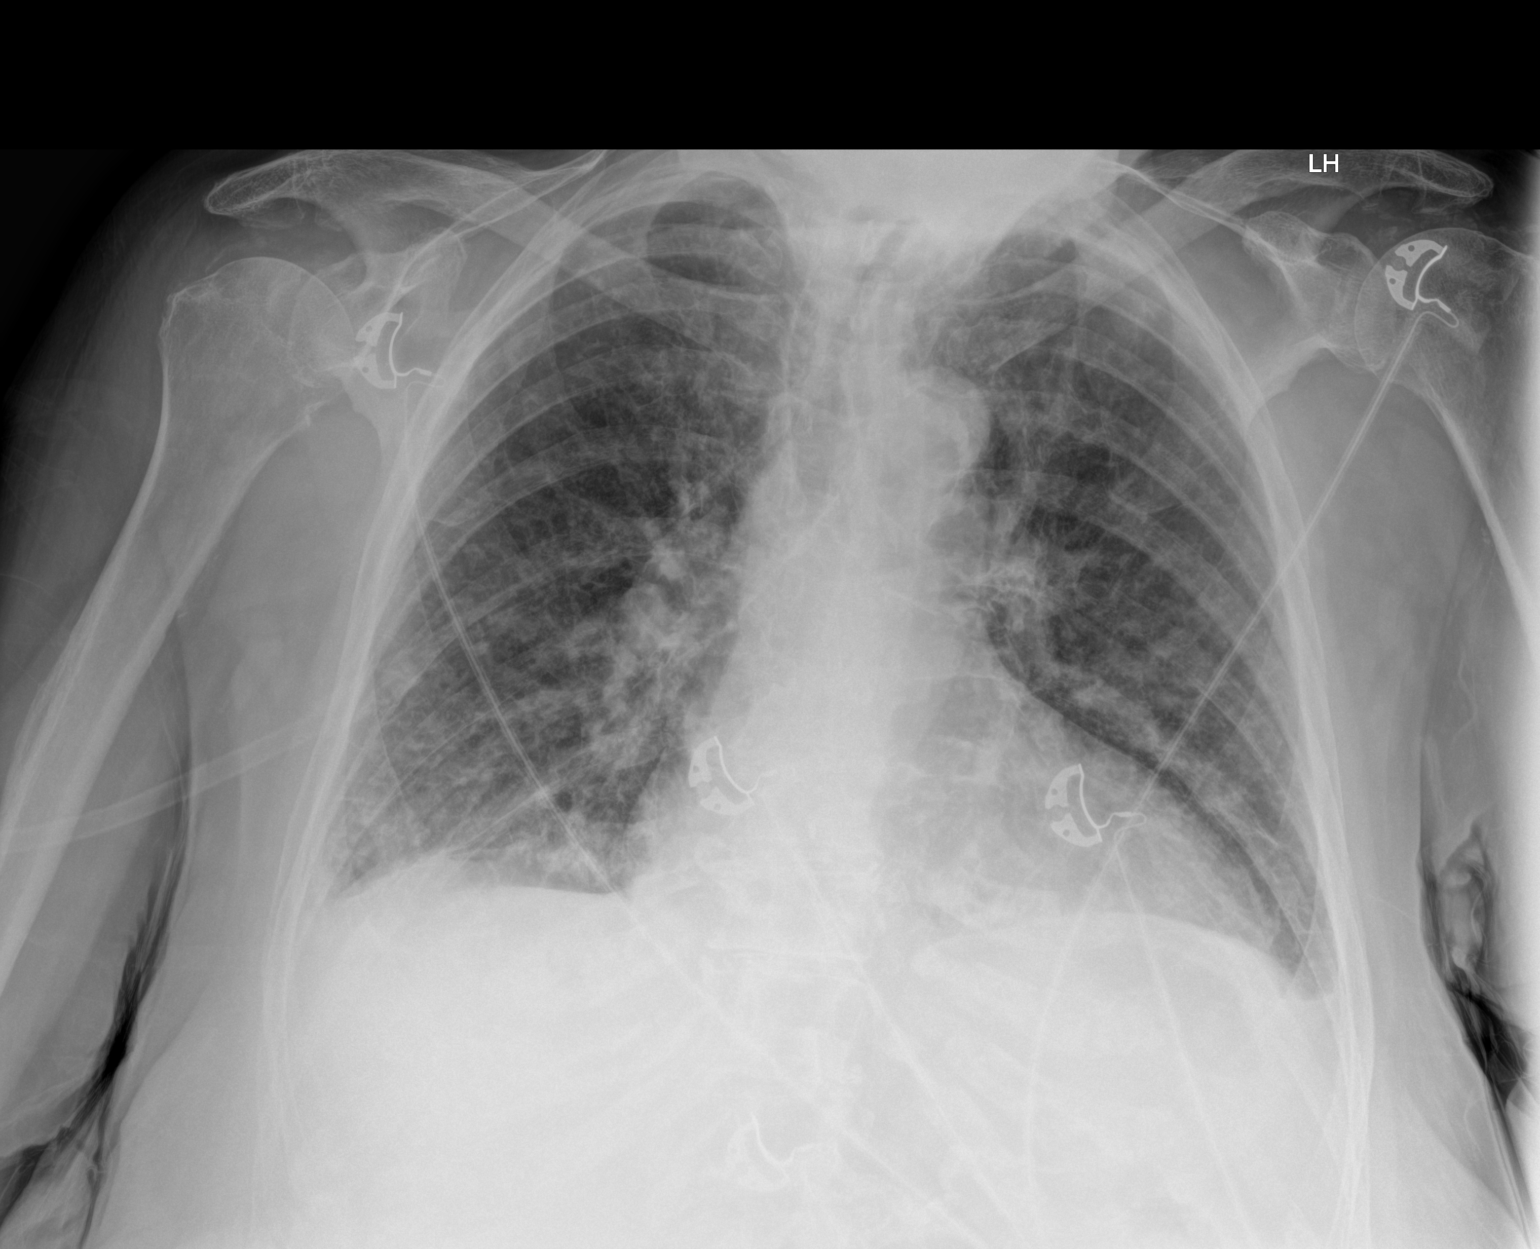

[1 of 1 positions shown; findings below may reference images not displayed]

FINDINGS: Portable AP upright view at 0237 hours. Acute on chronic bilateral
pulmonary interstitial opacity. Lung volumes are lower. Stable
cardiac size and mediastinal contours. Visualized tracheal air
column is within normal limits. No pneumothorax, pleural effusion or
consolidation. Paucity of bowel gas in the upper abdomen. No acute
osseous abnormality identified.
IMPRESSION: Lower lung volumes with acute on chronic pulmonary interstitial
opacity. Consider viral/atypical respiratory infection and pulmonary
interstitial edema.
